# Patient Record
Sex: Female | Born: 1985 | Race: White | Hispanic: No | Marital: Married | Smoking: Current every day smoker
Health system: Southern US, Community
[De-identification: ages and names within clinical notes are randomized; demographics above are authoritative.]

## PROBLEM LIST (undated history)

## (undated) DIAGNOSIS — N644 Mastodynia: Secondary | ICD-10-CM

## (undated) DIAGNOSIS — K219 Gastro-esophageal reflux disease without esophagitis: Secondary | ICD-10-CM

## (undated) DIAGNOSIS — M549 Dorsalgia, unspecified: Secondary | ICD-10-CM

## (undated) DIAGNOSIS — F419 Anxiety disorder, unspecified: Secondary | ICD-10-CM

## (undated) DIAGNOSIS — E785 Hyperlipidemia, unspecified: Secondary | ICD-10-CM

## (undated) DIAGNOSIS — N3281 Overactive bladder: Secondary | ICD-10-CM

## (undated) DIAGNOSIS — IMO0001 Reserved for inherently not codable concepts without codable children: Secondary | ICD-10-CM

## (undated) DIAGNOSIS — F17200 Nicotine dependence, unspecified, uncomplicated: Secondary | ICD-10-CM

## (undated) DIAGNOSIS — R1032 Left lower quadrant pain: Secondary | ICD-10-CM

## (undated) DIAGNOSIS — L0292 Furuncle, unspecified: Secondary | ICD-10-CM

## (undated) DIAGNOSIS — Z309 Encounter for contraceptive management, unspecified: Secondary | ICD-10-CM

## (undated) DIAGNOSIS — R350 Frequency of micturition: Secondary | ICD-10-CM

## (undated) HISTORY — DX: Mastodynia: N64.4

## (undated) HISTORY — DX: Overactive bladder: N32.81

## (undated) HISTORY — DX: Left lower quadrant pain: R10.32

## (undated) HISTORY — DX: Nicotine dependence, unspecified, uncomplicated: F17.200

## (undated) HISTORY — PX: NO PAST SURGERIES: SHX2092

## (undated) HISTORY — DX: Hyperlipidemia, unspecified: E78.5

## (undated) HISTORY — DX: Furuncle, unspecified: L02.92

## (undated) HISTORY — DX: Encounter for contraceptive management, unspecified: Z30.9

## (undated) HISTORY — DX: Frequency of micturition: R35.0

## (undated) HISTORY — DX: Reserved for inherently not codable concepts without codable children: IMO0001

## (undated) HISTORY — DX: Gastro-esophageal reflux disease without esophagitis: K21.9

---
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
## 2004-01-21 ENCOUNTER — Emergency Department (HOSPITAL_COMMUNITY): Admission: EM | Admit: 2004-01-21 | Discharge: 2004-01-21 | Payer: Self-pay | Admitting: Emergency Medicine

## 2004-03-10 ENCOUNTER — Emergency Department (HOSPITAL_COMMUNITY): Admission: EM | Admit: 2004-03-10 | Discharge: 2004-03-10 | Payer: Self-pay | Admitting: Emergency Medicine

## 2004-03-26 ENCOUNTER — Ambulatory Visit (HOSPITAL_COMMUNITY): Admission: RE | Admit: 2004-03-26 | Discharge: 2004-03-26 | Payer: Self-pay | Admitting: Family Medicine

## 2004-06-07 ENCOUNTER — Emergency Department (HOSPITAL_COMMUNITY): Admission: EM | Admit: 2004-06-07 | Discharge: 2004-06-07 | Payer: Self-pay | Admitting: Emergency Medicine

## 2004-06-08 ENCOUNTER — Ambulatory Visit (HOSPITAL_COMMUNITY): Admission: RE | Admit: 2004-06-08 | Discharge: 2004-06-08 | Payer: Self-pay | Admitting: Emergency Medicine

## 2004-07-07 ENCOUNTER — Emergency Department (HOSPITAL_COMMUNITY): Admission: EM | Admit: 2004-07-07 | Discharge: 2004-07-08 | Payer: Self-pay | Admitting: Emergency Medicine

## 2004-08-13 ENCOUNTER — Ambulatory Visit (HOSPITAL_COMMUNITY): Admission: RE | Admit: 2004-08-13 | Discharge: 2004-08-13 | Payer: Self-pay | Admitting: Family Medicine

## 2004-09-22 ENCOUNTER — Emergency Department (HOSPITAL_COMMUNITY): Admission: EM | Admit: 2004-09-22 | Discharge: 2004-09-23 | Payer: Self-pay | Admitting: *Deleted

## 2004-11-27 ENCOUNTER — Emergency Department (HOSPITAL_COMMUNITY): Admission: EM | Admit: 2004-11-27 | Discharge: 2004-11-27 | Payer: Self-pay | Admitting: Emergency Medicine

## 2005-06-05 ENCOUNTER — Emergency Department (HOSPITAL_COMMUNITY): Admission: EM | Admit: 2005-06-05 | Discharge: 2005-06-05 | Payer: Self-pay | Admitting: Emergency Medicine

## 2005-09-01 ENCOUNTER — Emergency Department (HOSPITAL_COMMUNITY): Admission: EM | Admit: 2005-09-01 | Discharge: 2005-09-01 | Payer: Self-pay | Admitting: Emergency Medicine

## 2005-10-31 ENCOUNTER — Inpatient Hospital Stay (HOSPITAL_COMMUNITY): Admission: EM | Admit: 2005-10-31 | Discharge: 2005-11-02 | Payer: Self-pay | Admitting: Emergency Medicine

## 2005-11-14 ENCOUNTER — Ambulatory Visit (HOSPITAL_COMMUNITY): Admission: RE | Admit: 2005-11-14 | Discharge: 2005-11-14 | Payer: Self-pay | Admitting: Obstetrics and Gynecology

## 2005-12-23 ENCOUNTER — Emergency Department (HOSPITAL_COMMUNITY): Admission: EM | Admit: 2005-12-23 | Discharge: 2005-12-23 | Payer: Self-pay | Admitting: Emergency Medicine

## 2006-01-03 ENCOUNTER — Emergency Department (HOSPITAL_COMMUNITY): Admission: EM | Admit: 2006-01-03 | Discharge: 2006-01-04 | Payer: Self-pay | Admitting: Emergency Medicine

## 2006-02-12 ENCOUNTER — Emergency Department (HOSPITAL_COMMUNITY): Admission: EM | Admit: 2006-02-12 | Discharge: 2006-02-12 | Payer: Self-pay | Admitting: Emergency Medicine

## 2006-03-07 ENCOUNTER — Emergency Department (HOSPITAL_COMMUNITY): Admission: EM | Admit: 2006-03-07 | Discharge: 2006-03-07 | Payer: Self-pay | Admitting: Emergency Medicine

## 2006-03-21 ENCOUNTER — Emergency Department (HOSPITAL_COMMUNITY): Admission: EM | Admit: 2006-03-21 | Discharge: 2006-03-21 | Payer: Self-pay | Admitting: Emergency Medicine

## 2006-04-13 ENCOUNTER — Emergency Department (HOSPITAL_COMMUNITY): Admission: EM | Admit: 2006-04-13 | Discharge: 2006-04-13 | Payer: Self-pay | Admitting: Emergency Medicine

## 2006-06-09 ENCOUNTER — Emergency Department (HOSPITAL_COMMUNITY): Admission: EM | Admit: 2006-06-09 | Discharge: 2006-06-09 | Payer: Self-pay | Admitting: Emergency Medicine

## 2006-06-22 ENCOUNTER — Emergency Department (HOSPITAL_COMMUNITY): Admission: EM | Admit: 2006-06-22 | Discharge: 2006-06-22 | Payer: Self-pay | Admitting: Emergency Medicine

## 2006-07-20 ENCOUNTER — Emergency Department (HOSPITAL_COMMUNITY): Admission: EM | Admit: 2006-07-20 | Discharge: 2006-07-20 | Payer: Self-pay | Admitting: Emergency Medicine

## 2006-08-17 ENCOUNTER — Emergency Department (HOSPITAL_COMMUNITY): Admission: EM | Admit: 2006-08-17 | Discharge: 2006-08-17 | Payer: Self-pay | Admitting: Emergency Medicine

## 2006-08-27 ENCOUNTER — Emergency Department (HOSPITAL_COMMUNITY): Admission: EM | Admit: 2006-08-27 | Discharge: 2006-08-27 | Payer: Self-pay | Admitting: Emergency Medicine

## 2006-09-24 ENCOUNTER — Emergency Department (HOSPITAL_COMMUNITY): Admission: EM | Admit: 2006-09-24 | Discharge: 2006-09-25 | Payer: Self-pay | Admitting: Emergency Medicine

## 2006-10-11 ENCOUNTER — Emergency Department (HOSPITAL_COMMUNITY): Admission: EM | Admit: 2006-10-11 | Discharge: 2006-10-11 | Payer: Self-pay | Admitting: Emergency Medicine

## 2006-12-09 IMAGING — CR DG CHEST 2V
2 series · 2 of 2 positions shown · non-contrast
Comparison: 08/13/2004.

CLINICAL DATA: Fever, chest congestion and myalgias.

CHEST - 2 VIEW

[view not recorded (1 of 2)]
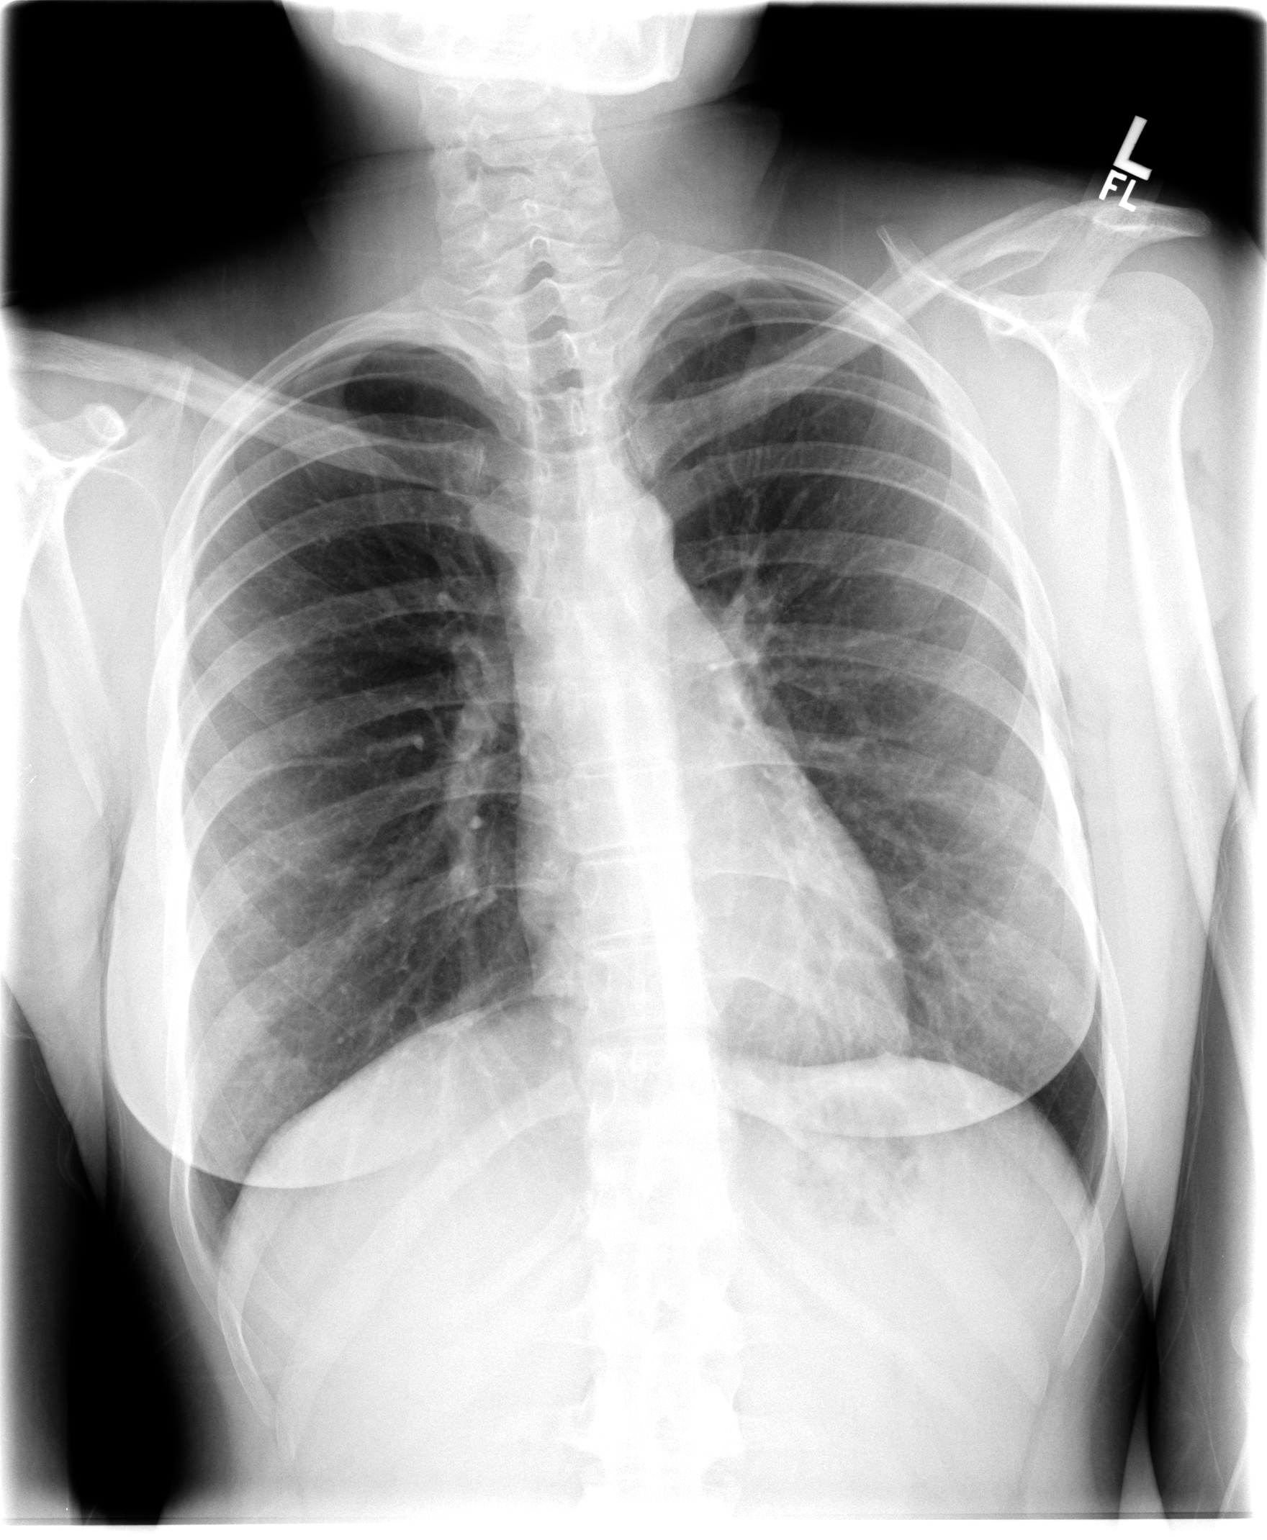

[view not recorded (2 of 2)]
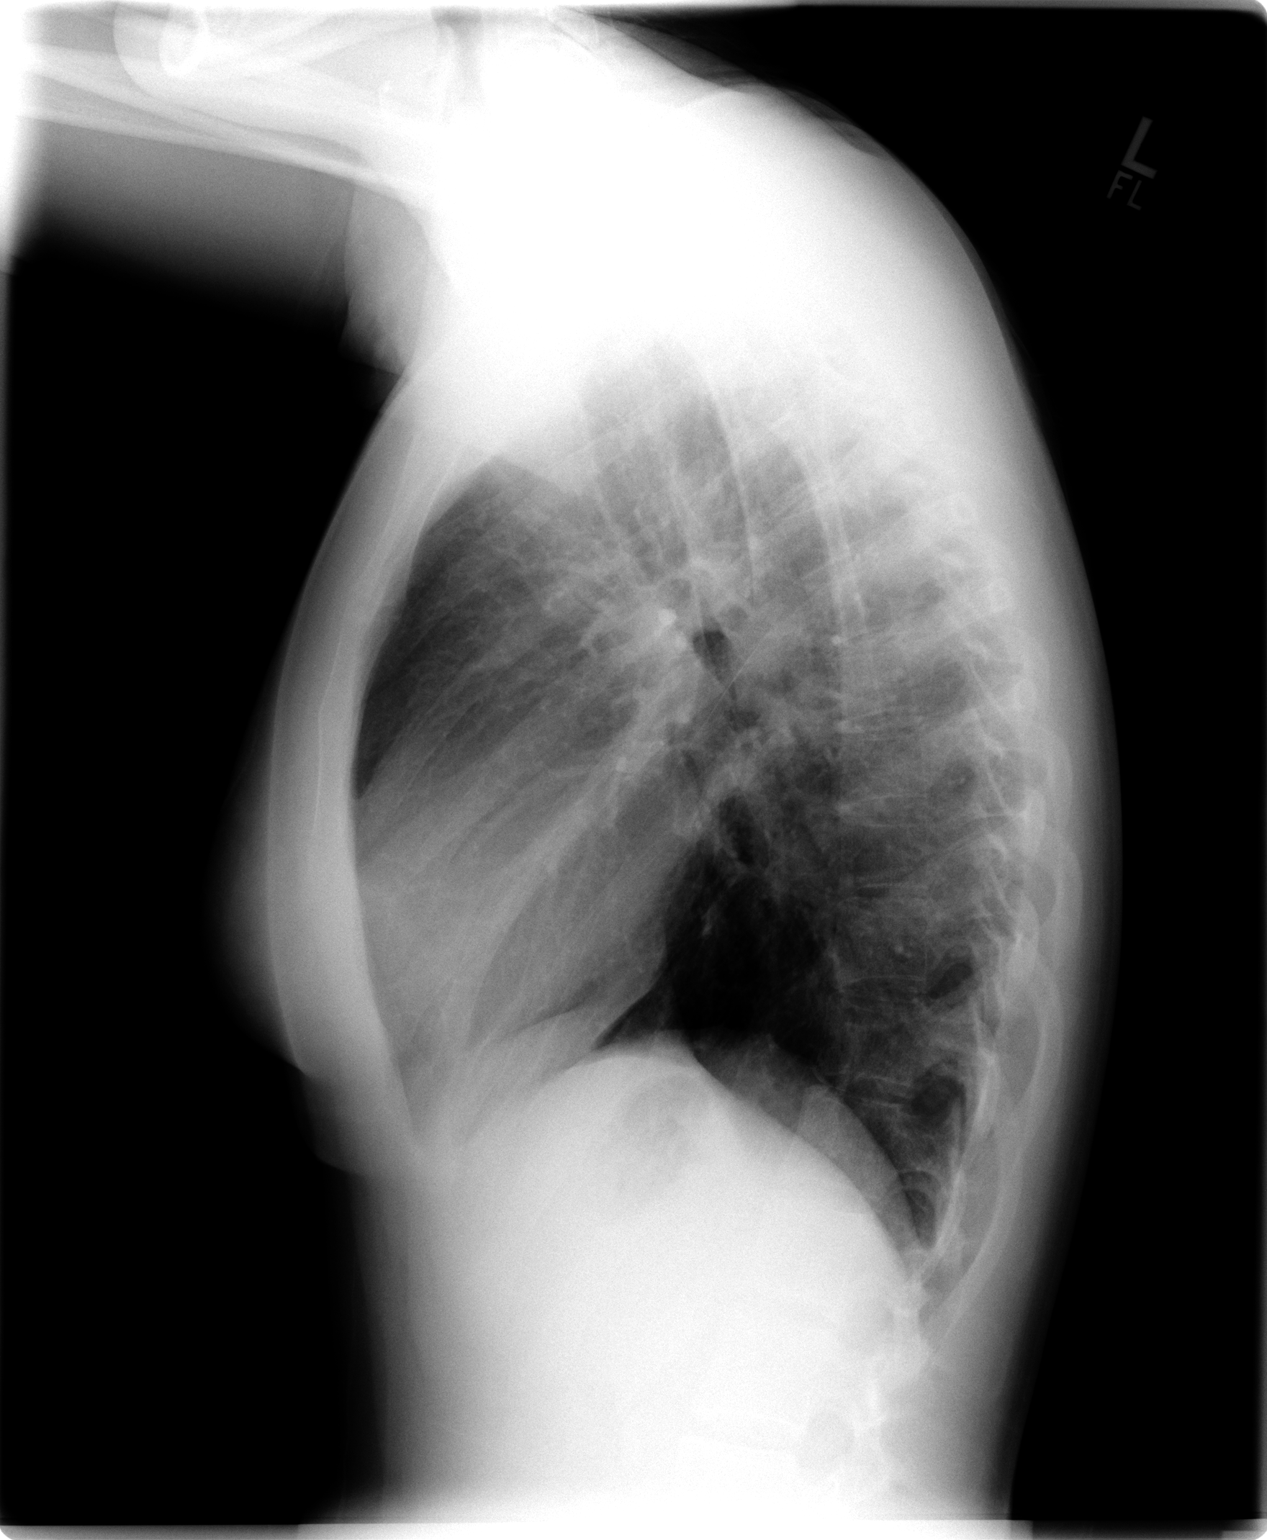

[2 of 2 positions shown; findings below may reference images not displayed]

FINDINGS: Stable normal sized heart and mild diffuse peribronchial thickening.
No airspace consolidation. Mild positional scoliosis.

IMPRESSION

Stable mild bronchitic changes.

## 2007-02-11 ENCOUNTER — Ambulatory Visit (HOSPITAL_COMMUNITY): Admission: AD | Admit: 2007-02-11 | Discharge: 2007-02-11 | Payer: Self-pay | Admitting: Obstetrics and Gynecology

## 2007-03-11 ENCOUNTER — Ambulatory Visit (HOSPITAL_COMMUNITY): Admission: AD | Admit: 2007-03-11 | Discharge: 2007-03-11 | Payer: Self-pay | Admitting: Obstetrics and Gynecology

## 2007-04-13 ENCOUNTER — Ambulatory Visit (HOSPITAL_COMMUNITY): Admission: RE | Admit: 2007-04-13 | Discharge: 2007-04-13 | Payer: Self-pay | Admitting: Obstetrics and Gynecology

## 2007-04-24 ENCOUNTER — Ambulatory Visit (HOSPITAL_COMMUNITY): Admission: AD | Admit: 2007-04-24 | Discharge: 2007-04-24 | Payer: Self-pay | Admitting: Obstetrics and Gynecology

## 2007-04-25 ENCOUNTER — Inpatient Hospital Stay (HOSPITAL_COMMUNITY): Admission: AD | Admit: 2007-04-25 | Discharge: 2007-04-26 | Payer: Self-pay | Admitting: Obstetrics and Gynecology

## 2008-06-17 ENCOUNTER — Emergency Department (HOSPITAL_COMMUNITY): Admission: EM | Admit: 2008-06-17 | Discharge: 2008-06-17 | Payer: Self-pay | Admitting: *Deleted

## 2008-07-27 ENCOUNTER — Ambulatory Visit (HOSPITAL_COMMUNITY): Admission: RE | Admit: 2008-07-27 | Discharge: 2008-07-27 | Payer: Self-pay | Admitting: Family Medicine

## 2010-02-13 ENCOUNTER — Emergency Department (HOSPITAL_COMMUNITY): Admission: EM | Admit: 2010-02-13 | Discharge: 2010-02-13 | Payer: Self-pay | Admitting: Emergency Medicine

## 2010-04-18 ENCOUNTER — Ambulatory Visit (HOSPITAL_COMMUNITY): Admission: RE | Admit: 2010-04-18 | Discharge: 2010-04-18 | Payer: Self-pay | Admitting: Family Medicine

## 2010-08-24 ENCOUNTER — Ambulatory Visit (HOSPITAL_COMMUNITY): Admission: RE | Admit: 2010-08-24 | Discharge: 2010-08-24 | Payer: Self-pay | Admitting: Internal Medicine

## 2010-08-28 ENCOUNTER — Other Ambulatory Visit: Admission: RE | Admit: 2010-08-28 | Discharge: 2010-08-28 | Payer: Self-pay | Admitting: Obstetrics & Gynecology

## 2010-09-28 ENCOUNTER — Ambulatory Visit (HOSPITAL_COMMUNITY): Admission: RE | Admit: 2010-09-28 | Discharge: 2010-09-28 | Payer: Self-pay | Admitting: Internal Medicine

## 2010-10-14 ENCOUNTER — Emergency Department (HOSPITAL_COMMUNITY): Admission: EM | Admit: 2010-10-14 | Discharge: 2010-10-14 | Payer: Self-pay | Admitting: Emergency Medicine

## 2010-12-22 ENCOUNTER — Encounter: Payer: Self-pay | Admitting: Family Medicine

## 2010-12-23 ENCOUNTER — Encounter: Payer: Self-pay | Admitting: Internal Medicine

## 2011-01-31 ENCOUNTER — Other Ambulatory Visit: Payer: Self-pay | Admitting: Obstetrics & Gynecology

## 2011-01-31 DIAGNOSIS — N644 Mastodynia: Secondary | ICD-10-CM

## 2011-02-06 ENCOUNTER — Ambulatory Visit (HOSPITAL_COMMUNITY)
Admission: RE | Admit: 2011-02-06 | Discharge: 2011-02-06 | Disposition: A | Discharge status: Home / Self Care | Payer: Medicaid Other | Source: Ambulatory Visit | Attending: Obstetrics & Gynecology | Admitting: Obstetrics & Gynecology

## 2011-02-06 DIAGNOSIS — N644 Mastodynia: Secondary | ICD-10-CM | POA: Insufficient documentation

## 2011-02-06 DIAGNOSIS — N63 Unspecified lump in unspecified breast: Secondary | ICD-10-CM | POA: Insufficient documentation

## 2011-02-12 LAB — CBC
MCH: 30.3 pg (ref 26.0–34.0)
MCHC: 34.5 g/dL (ref 30.0–36.0)
Platelets: 158 10*3/uL (ref 150–400)
RDW: 12.8 % (ref 11.5–15.5)

## 2011-02-12 LAB — BASIC METABOLIC PANEL
BUN: 6 mg/dL (ref 6–23)
CO2: 24 mEq/L (ref 19–32)
Calcium: 8.9 mg/dL (ref 8.4–10.5)
Creatinine, Ser: 0.77 mg/dL (ref 0.4–1.2)
GFR calc non Af Amer: >60 mL/min (ref >60)
Glucose, Bld: 106 mg/dL — ABNORMAL HIGH (ref 70–99)

## 2011-02-12 LAB — URINALYSIS, ROUTINE W REFLEX MICROSCOPIC
Glucose, UA: NEGATIVE mg/dL
Protein, ur: NEGATIVE mg/dL
Urobilinogen, UA: 0.2 mg/dL (ref 0.0–1.0)

## 2011-02-12 LAB — DIFFERENTIAL
Basophils Absolute: 0.1 10*3/uL (ref 0.0–0.1)
Basophils Relative: 1 % (ref 0–1)
Eosinophils Absolute: 0 10*3/uL (ref 0.0–0.7)
Monocytes Relative: 7 % (ref 3–12)
Neutrophils Relative %: 74 % (ref 43–77)

## 2011-04-16 NOTE — H&P (Signed)
NAMEDESIRE, FULP NO.:  [id number]   MEDICAL RECORD NO.:  [id number]          PATIENT TYPE:  INP   LOCATION:  LDR1                          FACILITY:  [facility]   PHYSICIAN:  Lazaro Arms, M.D.   DATE OF BIRTH:  May 29, 1986   DATE OF ADMISSION:  04/25/2007  DATE OF DISCHARGE:  LH                              HISTORY & PHYSICAL   Rebecca [name] is a 25 year old white female, gravida 2, para 1.  Estimated date of delivery of Apr 24, 2007 at 40 weeks and a day  gestation, who presented complaining of regular uterine contractions.  She was found to be 4 cm and in labor.  She has a reactive NST.  When I  checked her she was 5, 100, and 0 station.  She did an amniotic with  clear fluid.  Her antepartum course has been unremarkable.   PAST MEDICAL HISTORY:  Negative.   PAST SURGICAL HISTORY:  Negative.   PAST OB HISTORY:  Vaginal delivery 36-1/2 weeks in 2002.   ALLERGIES:  AMOXICILLIN AND FLAGYL.   REVIEW OF SYSTEMS:  Otherwise negative.   Blood type is 0-positive.  Rubella is equivocal.  Varicella is immune.  Serology is nonreactive.  Hepatitis B was negative.  HSV-2 was negative.  Pap was normal.  GC and chlamydia were negative x2.  Group B strep was  negative.  Glucola was normal.   IMPRESSION:  1. Intrauterine pregnancy at 40-1/[redacted] weeks gestation.  2. Labor.   PLAN:  The expected management for NSVD.      Lazaro Arms, M.D.  Electronically Signed     LHE/MEDQ  D:  04/25/2007  T:  04/25/2007  Job:  [id number]

## 2011-04-16 NOTE — Op Note (Signed)
NAME:  VERSA, CRATON NO.:  [id number]   MEDICAL RECORD NO.:  [id number]          PATIENT TYPE:  INP   LOCATION:  LDR1                          FACILITY:  [facility]   PHYSICIAN:  Lazaro Arms, M.D.   DATE OF BIRTH:  Mar 06, 1986   DATE OF PROCEDURE:  04/25/2007  DATE OF DISCHARGE:                               OPERATIVE REPORT   Rebecca [name] is a 25 year old lady who progressed well through the  first stage of labor, had about 4 contractions where she had maternal  expulsive efforts, over an intact perineum delivered a viable female  infant at 11:05 with Apgars of 9 and 9, weighing 6 pounds 2 ounces.  There was a 3-vessel cord.  Cord blood and cord gas were sent.  Placenta  was delivered spontaneously, was normal intact.  The uterus was firm  below the umbilicus.  There was a second-degree vaginal laceration which  resulted that required two 2-0 Monocryl sutures, and it was not  bleeding.  She had very minor abrasion in the right periurethral area  that was not bleeding and did not require any repair.  She will undergo  routine postpartum [name].  She is group B strep negative, rubella  equivocal.      Lazaro Arms, M.D.  Electronically Signed     LHE/MEDQ  D:  04/25/2007  T:  04/25/2007  Job:  [id number]

## 2011-04-19 NOTE — H&P (Signed)
NAMEARIELY, RIDDELL              ACCOUNT NO.:  [id number]   MEDICAL RECORD NO.:  [id number]          PATIENT TYPE:  OIB   LOCATION:  A415                          FACILITY:  [facility]   PHYSICIAN:  Tilda Burrow, M.D. DATE OF BIRTH:  1986/07/20   DATE OF ADMISSION:  03/11/2007  DATE OF DISCHARGE:  04/09/2008LH                              HISTORY & PHYSICAL   OBSERVATION ADMISSION HISTORY AND PHYSICAL   REASON FOR ADMISSION:  Pregnancy at 34 weeks and 2 days with complaints  of sharp, shooting, low abdominal pains and lots of pelvic pressure.   MEDICAL HISTORY:  Negative.   SURGICAL HISTORY:  Negative.   ALLERGIES:  1. AMOXICILLIN.  2. FLAGYL.   FAMILY HISTORY:  Positive for coronary artery disease and cancer.   Prenatal course has been uneventful.  Blood type is O-positive, UDS  negative, Rubella is equivocal, hepatitis B surface antigen negative,  HIV is negative, HSV is negative.  Serology is negative on blood draws.  Pap is normal, GC and Chlamydia are negative, aFP is within normal  limits.  A 28-week hemoglobin 11.3, 28-week hematocrit 36.0, one-hour  glucose is 88.   PHYSICAL EXAMINATION:  VITAL SIGNS:  Stable.  PELVIC EXAM:  Cervix is 1 to 2, which she was that in the office  yesterday, about 70% effaced, about minus one station.   PLAN:  We are going to monitor, evaluate, and possible tocolytics if  uterine contractions are regular in nature.     Zerita Boers, Lanier Clam      Tilda Burrow, M.D.  Electronically Signed   DL/MEDQ  D:  42/[id number]  T:  03/11/2007  Job:  [id number]

## 2011-04-19 NOTE — Discharge Summary (Signed)
NAMECRIS, GIBBY              ACCOUNT NO.:  [id number]   MEDICAL RECORD NO.:  [id number]          PATIENT TYPE:  INP   LOCATION:  A304                          FACILITY:  [facility]   PHYSICIAN:  Corrie Mckusick, M.D.  DATE OF BIRTH:  1986/04/10   DATE OF ADMISSION:  10/31/2005  DATE OF DISCHARGE:  12/02/2006LH                                 DISCHARGE SUMMARY   DISCHARGE DIAGNOSIS:  Viral gastroenteritis.   HISTORY OF PRESENT ILLNESS:  For history of present illness and past medical  history, please see admission H&P.   HOSPITAL COURSE:  This is a 25 year old female with no significant past  medical history who presented to the emergency department with 24 hours of  increasing nausea, vomiting and watery diarrhea.  She was put into the  hospital for aggressive IV fluids and close monitoring.  She did develop  some hypokalemia and this was replaced.  She responded beautifully after the  first day.  She had some dilutional change in her hemoglobin and hematocrit,  although guaiacing of stools were all negative.  She was started on a PPI  for mid epigastric pain.  We also got an abdominal ultrasound which was  negative as I wanted to make sure we did not have any cystic disease.  She  has had for the past couple of months mid epigastric pain which sounds more  GERD like.  Interestingly enough on ultrasound, she had food found in the  stomach and she had been NPO x6 hours.  We decided to set her up as an  outpatient to see the gastrointestinal specialist for probable EGD and maybe  even upper GI series.   DISCHARGE PHYSICAL EXAMINATION:  VITAL SIGNS:  Temperature 98.2, blood  pressure 83/53, pulse 95, respirations 20.  GENERAL:  A pleasant female, feels great in no acute distress.  HEENT:  Nasopharynx clear with moist mucous membranes.  NECK:  Supple.  CHEST:  Clear to auscultation bilaterally.  CARDIAC:  Regular rate and rhythm with normal S1, S2, no murmurs.  ABDOMEN:  Bowel sounds  positive, soft, nontender, nondistended.  EXTREMITIES:  No edema.   DISCHARGE MEDICATIONS:  Aciphex 20 mg daily.   DISCHARGE LABORATORY DATA AND X-RAY FINDINGS:  Please see flow sheet for  details.   FOLLOW UP:  She is going to follow up with me next week and recheck  potassium as well as setting her up with Dr. Karilyn Cota as an outpatient.  If  she has any concerns or problems in the meantime she will follow up.      Corrie Mckusick, M.D.  Electronically Signed     JCG/MEDQ  D:  11/02/2005  T:  11/02/2005  Job:  [id number]

## 2011-04-19 NOTE — H&P (Signed)
Rebecca, [name] NO.:  [id number]   MEDICAL RECORD NO.:  [id number]          PATIENT TYPE:  INP   LOCATION:  A210                          FACILITY:  [facility]   PHYSICIAN:  Corrie Mckusick, M.D.  DATE OF BIRTH:  12-20-1985   DATE OF ADMISSION:  10/31/2005  DATE OF DISCHARGE:  LH                                HISTORY & PHYSICAL   ADMISSION DIAGNOSIS:  Viral gastroenteritis.   HISTORY OF PRESENT ILLNESS:  This is a 25 year old female with no  significant past medical history who presents to the emergency department  with 24 hours of increasing nausea, vomiting and watery diarrhea.  She had  not been able to keep anything down and came in to the emergency department  dehydrated.  No other real symptomatology from a cardiovascular, respiratory  or GU standpoint.  She has no other neurological symptomatology.   She had no recent travel, no recent antibiotics, no new foods, no fresh  water and no sick contacts.  In the emergency department, she started to get  IV fluids and felt remarkably better.   It should be noted that she has had about 6-8 weeks of increasing mid  epigastric discomfort and nausea when eating fatty foods.  She says she has  no right upper quadrant pain and there is no family history of gallbladder  disease.   PAST MEDICAL HISTORY:  None.   PAST SURGICAL HISTORY:  None.   SOCIAL HISTORY:  Smokes about 1 pack a day.  No alcohol or illicit drug use  at all.   FAMILY HISTORY:  Noncontributory.   MEDICATIONS:  None.   ALLERGIES:  CIPRO.   PHYSICAL EXAMINATION:  VITAL SIGNS:  Temperature 99.4, blood pressure  108/52, pulse 100, respirations 18.  GENERAL:  When I saw her she was pleasant, feeling quite a bit better in no  acute distress.  HEENT:  Nasopharynx is clear with slightly dry mucous membranes.  NECK:  Supple with no lymphadenopathy.  CHEST:  Clear to auscultation bilaterally.  CARDIAC:  Regular rate and rhythm, normal S1, S2.   No S3, murmurs, rubs or  gallops.  ABDOMEN:  Bowel sounds positive.  There is tenderness in the periumbilical  area.  No right upper quadrant pain.  No right lower quadrant pain  whatsoever.  No rebound or guarding.  EXTREMITIES:  No edema.  NEUROLOGIC:  Intact.   LABORATORY DATA AND X-RAY FINDINGS:  Chest x-ray was clear.   Blood cultures drawn with white count 18.3, hematocrit 43.0, platelets 217.  Sodium 139, potassium 4.1, chloride 109, bicarb 24, glucose 130, BUN 11,  creatinine 0.9, calcium 9.6.  Urinalysis is clear.   ASSESSMENT:  A 25 year old with probable gastroenteritis with questionable  underlying gastroesophageal reflux disease.   PLAN:  1.  Admit for aggressive IV fluids.  2.  Close monitoring.  3.  Continue to follow electrolytes.  4.  PPI.  5.  Cultures are pending.  The patient did receive a dose of Rocephin in the      ER.  6.  Will continue to follow closely.  Corrie Mckusick, M.D.  Electronically Signed     JCG/MEDQ  D:  11/01/2005  T:  11/01/2005  Job:  [id number]

## 2011-04-21 ENCOUNTER — Emergency Department (HOSPITAL_COMMUNITY)
Admission: EM | Admit: 2011-04-21 | Discharge: 2011-04-22 | Disposition: A | Discharge status: Home / Self Care | Payer: Medicaid Other | Attending: Emergency Medicine | Admitting: Emergency Medicine

## 2011-04-21 DIAGNOSIS — K5289 Other specified noninfective gastroenteritis and colitis: Secondary | ICD-10-CM | POA: Insufficient documentation

## 2011-04-21 DIAGNOSIS — Z79899 Other long term (current) drug therapy: Secondary | ICD-10-CM | POA: Insufficient documentation

## 2011-04-21 DIAGNOSIS — F411 Generalized anxiety disorder: Secondary | ICD-10-CM | POA: Insufficient documentation

## 2011-04-21 LAB — CBC
HCT: 45.3 % (ref 36.0–46.0)
Hemoglobin: 15.4 g/dL — ABNORMAL HIGH (ref 12.0–15.0)
MCH: 30.4 pg (ref 26.0–34.0)
MCHC: 34 g/dL (ref 30.0–36.0)
MCV: 89.5 fL (ref 78.0–100.0)
Platelets: 209 K/uL (ref 150–400)
RBC: 5.06 MIL/uL (ref 3.87–5.11)
RDW: 13.3 % (ref 11.5–15.5)
WBC: 15.5 K/uL — ABNORMAL HIGH (ref 4.0–10.5)

## 2011-04-21 LAB — URINE MICROSCOPIC-ADD ON

## 2011-04-21 LAB — DIFFERENTIAL
Basophils Absolute: 0 10*3/uL (ref 0.0–0.1)
Eosinophils Relative: 1 % (ref 0–5)
Lymphocytes Relative: 9 % — ABNORMAL LOW (ref 12–46)
Lymphs Abs: 1.3 10*3/uL (ref 0.7–4.0)
Monocytes Absolute: 0.6 10*3/uL (ref 0.1–1.0)

## 2011-04-21 LAB — URINALYSIS, ROUTINE W REFLEX MICROSCOPIC
Glucose, UA: NEGATIVE mg/dL
Hgb urine dipstick: NEGATIVE
Nitrite: NEGATIVE
Protein, ur: 100 mg/dL — AB
Specific Gravity, Urine: >1.03 — ABNORMAL HIGH (ref 1.005–1.030)
Urobilinogen, UA: 1 mg/dL (ref 0.0–1.0)
pH: 5.5 (ref 5.0–8.0)

## 2011-04-21 LAB — LIPASE, BLOOD: Lipase: 29 U/L (ref 11–59)

## 2011-04-21 LAB — COMPREHENSIVE METABOLIC PANEL
BUN: 12 mg/dL (ref 6–23)
Calcium: 10.6 mg/dL — ABNORMAL HIGH (ref 8.4–10.5)
Glucose, Bld: 135 mg/dL — ABNORMAL HIGH (ref 70–99)
Total Protein: 8.4 g/dL — ABNORMAL HIGH (ref 6.0–8.3)

## 2011-04-23 LAB — URINE CULTURE: Culture  Setup Time: 201205211230

## 2011-04-25 LAB — POCT PREGNANCY, URINE: Preg Test, Ur: NEGATIVE

## 2011-08-30 ENCOUNTER — Other Ambulatory Visit (HOSPITAL_COMMUNITY)
Admission: RE | Admit: 2011-08-30 | Discharge: 2011-08-30 | Disposition: A | Discharge status: Home / Self Care | Payer: Medicaid Other | Source: Ambulatory Visit | Attending: Obstetrics & Gynecology | Admitting: Obstetrics & Gynecology

## 2011-08-30 ENCOUNTER — Other Ambulatory Visit: Payer: Self-pay | Admitting: Obstetrics & Gynecology

## 2011-08-30 DIAGNOSIS — Z113 Encounter for screening for infections with a predominantly sexual mode of transmission: Secondary | ICD-10-CM | POA: Insufficient documentation

## 2011-08-30 DIAGNOSIS — Z01419 Encounter for gynecological examination (general) (routine) without abnormal findings: Secondary | ICD-10-CM | POA: Insufficient documentation

## 2011-11-10 ENCOUNTER — Encounter: Payer: Self-pay | Admitting: Emergency Medicine

## 2011-11-10 ENCOUNTER — Emergency Department (HOSPITAL_COMMUNITY)
Admission: EM | Admit: 2011-11-10 | Discharge: 2011-11-10 | Disposition: A | Discharge status: Home / Self Care | Payer: Self-pay | Attending: Emergency Medicine | Admitting: Emergency Medicine

## 2011-11-10 DIAGNOSIS — F17200 Nicotine dependence, unspecified, uncomplicated: Secondary | ICD-10-CM | POA: Insufficient documentation

## 2011-11-10 DIAGNOSIS — J111 Influenza due to unidentified influenza virus with other respiratory manifestations: Secondary | ICD-10-CM | POA: Insufficient documentation

## 2011-11-10 MED ORDER — BENZONATATE 200 MG PO CAPS: 200.0000 mg | ORAL_CAPSULE | Freq: Three times a day (TID) | ORAL | Status: AC

## 2011-11-10 NOTE — ED Notes (Signed)
Pt c/o cough, congestion sore throat and body aches x 3 days.

## 2011-11-11 NOTE — ED Provider Notes (Signed)
History     CSN: [id number] Arrival date & time: 11/10/2011 11:45 AM   First MD Initiated Contact with Patient 11/10/11 1200      Chief Complaint  Patient presents with  . Cough    (Consider location/radiation/quality/duration/timing/severity/associated sxs/prior treatment) Patient is a 25 y.o. female presenting with cough. The history is provided by the patient.  Cough This is a new problem. The current episode started more than 2 days ago. The problem occurs constantly. The problem has not changed since onset.The cough is non-productive. The maximum temperature recorded prior to her arrival was 100 to 100.9 F. The fever has been present for 1 to 2 days. Associated symptoms include chills, sore throat and myalgias. Pertinent negatives include no chest pain, no ear pain, no headaches and no shortness of breath. Associated symptoms comments: Fatigue . Treatments tried: tylenol. The treatment provided mild (transient improvement in fever.  she has not recognized triggers that worsen her symptoms.Marland Kitchen) relief. She is a smoker. Her past medical history does not include pneumonia or asthma.    History reviewed. No pertinent past medical history.  History reviewed. No pertinent past surgical history.  No family history on file.  History  Substance Use Topics  . Smoking status: Current Everyday Smoker -- 1.0 packs/day for 5 years    Types: Cigarettes  . Smokeless tobacco: Never Used  . Alcohol Use: No    OB History    Grav Para Term Preterm Abortions TAB SAB Ect Mult Living   2 2 2              Review of Systems  Constitutional: Positive for fever, chills and fatigue.  HENT: Positive for congestion and sore throat. Negative for ear pain and neck pain.   Eyes: Negative.   Respiratory: Positive for cough. Negative for chest tightness and shortness of breath.   Cardiovascular: Negative for chest pain.  Gastrointestinal: Negative for nausea and abdominal pain.  Genitourinary:  Negative.   Musculoskeletal: Positive for myalgias. Negative for joint swelling and arthralgias.  Skin: Negative.  Negative for rash and wound.  Neurological: Negative for dizziness, light-headedness, numbness and headaches.  Hematological: Negative.   Psychiatric/Behavioral: Negative.     Allergies  Amoxicillin; Cephalosporins; and Septra  Home Medications   Current Outpatient Rx  Name Route Sig Dispense Refill  . ALPRAZOLAM 1 MG PO TABS Oral Take 1 mg by mouth every 4 (four) hours.      Marland Kitchen HYDROCODONE-ACETAMINOPHEN 10-650 MG PO TABS Oral Take 1 tablet by mouth every 4 (four) hours as needed.      Marland Kitchen BENZONATATE 200 MG PO CAPS Oral Take 1 capsule (200 mg total) by mouth every 8 (eight) hours. Take if needed for cough 21 capsule 0    BP 109/73  Pulse 71  Temp(Src) 99 F (37.2 C) (Oral)  Resp 16  Ht 5\' 1"  (1.549 m)  Wt 130 lb (58.968 kg)  BMI 24.56 kg/m2  SpO2 100%  LMP 07/11/2011  Physical Exam  Nursing note and vitals reviewed. Constitutional: She is oriented to person, place, and time. She appears well-developed and well-nourished.       Appears fatigued.   HENT:  Head: Normocephalic and atraumatic.  Right Ear: External ear normal.  Left Ear: External ear normal.  Mouth/Throat: Oropharynx is clear and moist.  Eyes: Conjunctivae are normal.  Neck: Normal range of motion.  Cardiovascular: Normal rate, regular rhythm, normal heart sounds and intact distal pulses.   Pulmonary/Chest: Breath sounds normal. She is in  respiratory distress. She has no wheezes. She has no rales.       Frequent dry cough.  Abdominal: Soft. Bowel sounds are normal. There is no tenderness.  Musculoskeletal: Normal range of motion.  Neurological: She is alert and oriented to person, place, and time.  Skin: Skin is warm and dry.  Psychiatric: She has a normal mood and affect.    ED Course  Procedures (including critical care time)  Labs Reviewed - No data to display No results found.   1.  Influenza       MDM  No acute distress ,  Stable vital signs.  The patient appears reasonably screened and/or stabilized for discharge and I doubt any other medical condition or other [facility] requiring further screening, evaluation, or treatment in the ED at this time prior to discharge.         Candis Musa, PA 11/11/11 1459

## 2011-11-11 NOTE — ED Provider Notes (Signed)
Medical screening examination/treatment/procedure(s) were performed by non-physician practitioner and as supervising physician I was immediately available for consultation/collaboration.   Chenay Nesmith W. Natash Berman, MD 11/11/11 1749 

## 2012-08-22 ENCOUNTER — Emergency Department (HOSPITAL_COMMUNITY): Payer: Medicaid Other

## 2012-08-22 ENCOUNTER — Emergency Department (HOSPITAL_COMMUNITY)
Admission: EM | Admit: 2012-08-22 | Discharge: 2012-08-22 | Disposition: A | Discharge status: Home / Self Care | Payer: Medicaid Other | Attending: Emergency Medicine | Admitting: Emergency Medicine

## 2012-08-22 ENCOUNTER — Encounter (HOSPITAL_COMMUNITY): Payer: Self-pay | Admitting: *Deleted

## 2012-08-22 DIAGNOSIS — J40 Bronchitis, not specified as acute or chronic: Secondary | ICD-10-CM

## 2012-08-22 DIAGNOSIS — Z888 Allergy status to other drugs, medicaments and biological substances status: Secondary | ICD-10-CM | POA: Insufficient documentation

## 2012-08-22 DIAGNOSIS — Z881 Allergy status to other antibiotic agents status: Secondary | ICD-10-CM | POA: Insufficient documentation

## 2012-08-22 DIAGNOSIS — F17200 Nicotine dependence, unspecified, uncomplicated: Secondary | ICD-10-CM | POA: Insufficient documentation

## 2012-08-22 MED ORDER — AZITHROMYCIN 250 MG PO TABS: 250.0000 mg | ORAL_TABLET | Freq: Every day | ORAL | Status: DC

## 2012-08-22 MED ORDER — ALBUTEROL SULFATE HFA 108 (90 BASE) MCG/ACT IN AERS: 2.0000 | INHALATION_SPRAY | RESPIRATORY_TRACT | Status: DC | PRN

## 2012-08-22 NOTE — ED Notes (Signed)
C/o head congestion, chills that started last night, cough this am,

## 2012-08-22 NOTE — ED Provider Notes (Signed)
History  This chart was scribed for Glynn Octave, MD by Erskine Emery. This patient was seen in room APA15/APA15 and the patient's care was started at 9:10.   CSN: [id number]  Arrival date & time 08/22/12  4098   First MD Initiated Contact with Patient 08/22/12 [phone]      Chief Complaint  Patient presents with  . URI    (Consider location/radiation/quality/duration/timing/severity/associated sxs/prior treatment) The history is provided by the patient. No language interpreter was used.  Rebecca [name] is a 26 y.o. female who presents to the Emergency Department complaining of an upper respiratory infection, including chest and head congestion, chills, productive cough (with dark yellow phlegm), weakness, generalized aching, a sore and swollen throat, and difficulty breathing since last night. Pt denies any associated fevers but says she feels "hot and cold." Pt knows of no recent sick contacts. Pt took sudafed and Tylenol severe congestion last night with no relief from symptoms and she hasn't taken anything this morning. Pt is on Deppo for birth control. Pt has been smoking about a hald pack of cigarettes/day for the past 5 years. Pt is on no medications and has no other medical conditions, including asthma.  Dr. Sherwood Gambler is the pt's PCP.  History reviewed. No pertinent past medical history.  History reviewed. No pertinent past surgical history.  No family history on file.  History  Substance Use Topics  . Smoking status: Current Every Day Smoker -- 1.0 packs/day for 5 years    Types: Cigarettes  . Smokeless tobacco: Never Used  . Alcohol Use: No    OB History    Grav Para Term Preterm Abortions TAB SAB Ect Mult Living   2 2 2              Review of Systems A complete 10 system review of systems was obtained and all systems are negative except as noted in the HPI and PMH.    Allergies  Amoxicillin; Cephalosporins; and Septra  Home Medications   Current Outpatient Rx    Name Route Sig Dispense Refill  . ALPRAZOLAM 1 MG PO TABS Oral Take 1 mg by mouth every 4 (four) hours as needed. For anxiety    . HYDROCODONE-ACETAMINOPHEN 10-650 MG PO TABS Oral Take 1 tablet by mouth every 4 (four) hours as needed. For pain    . MEDROXYPROGESTERONE ACETATE 150 MG/ML IM SUSP Intramuscular Inject 150 mg into the muscle every 3 (three) months.    . ADULT MULTIVITAMIN W/MINERALS CH Oral Take 1 tablet by mouth daily.    [name] SINUS SEVERE CONGEST PO Oral Take 2 tablets by mouth as needed. For cold and sinus symptoms    . PSEUDOEPHEDRINE HCL 30 MG PO TABS Oral Take 30 mg by mouth every 4 (four) hours as needed. For congestion      Triage Vitals: BP 115/79  Pulse 102  Temp 98.5 F (36.9 C)  Resp 20  Ht 5\' 1"  (1.549 m)  Wt 143 lb (64.864 kg)  BMI 27.02 kg/m2  SpO2 100%  Physical Exam  Nursing note and vitals reviewed. Constitutional: She is oriented to person, place, and time. She appears well-developed and well-nourished. No distress.  HENT:  Head: Normocephalic and atraumatic.  Right Ear: External ear normal.  Left Ear: External ear normal.  Mouth/Throat: No oropharyngeal exudate.       Mild oropharynx erythema, no exudate, no asymmetry, no meningismus.  Eyes: EOM are normal. Pupils are equal, round, and reactive to light.  Neck:  Normal range of motion. Neck supple. No tracheal deviation present.  Cardiovascular: Normal rate.   Pulmonary/Chest: Effort normal. No respiratory distress.       Lungs are clear.  Abdominal: Soft. She exhibits no distension.  Musculoskeletal: Normal range of motion. She exhibits no edema.  Neurological: She is alert and oriented to person, place, and time.  Skin: Skin is warm and dry.  Psychiatric: She has a normal mood and affect.    ED Course  Procedures (including critical care time) DIAGNOSTIC STUDIES: Oxygen Saturation is 100% on room air, normal by my interpretation.    COORDINATION OF CARE: 9:20--I evaluated the  patient and we discussed a treatment plan including chest x-ray and strep screen to which the pt agreed.   10:00--I rechecked the pt and informed her that her x-rays look fine with no signs of pneumonia.     Labs Reviewed  RAPID STREP SCREEN   Dg Chest 2 View  08/22/2012  *RADIOLOGY REPORT*  Clinical Data: Upper respiratory infection  CHEST - 2 VIEW  Comparison: 09/28/2010  Findings: Cardiomediastinal silhouette is stable.  No acute infiltrate or pulmonary edema.  Bony thorax is stable.  Again noted central mild bronchitic changes.  IMPRESSION: No acute infiltrate or pulmonary edema.  Again noted central mild bronchitic changes.   Original Report Authenticated By: Natasha Mead, M.D.      No diagnosis found.    MDM  One day of cough, congestion, chills and body aches. No fevers. No chest pain or shortness of breath. No syncope.  Chest x-ray negative for pneumonia. She supportively for upper respiratory infection. Smoking cessation encouraged.    I personally performed the services described in this documentation, which was scribed in my presence.  The recorded information has been reviewed and considered.    Glynn Octave, MD 08/22/12 1012

## 2012-12-26 ENCOUNTER — Encounter (HOSPITAL_COMMUNITY): Payer: Self-pay | Admitting: *Deleted

## 2012-12-26 ENCOUNTER — Emergency Department (HOSPITAL_COMMUNITY)
Admission: EM | Admit: 2012-12-26 | Discharge: 2012-12-26 | Disposition: A | Discharge status: Home / Self Care | Payer: Medicaid Other | Attending: Emergency Medicine | Admitting: Emergency Medicine

## 2012-12-26 DIAGNOSIS — Z79899 Other long term (current) drug therapy: Secondary | ICD-10-CM | POA: Insufficient documentation

## 2012-12-26 DIAGNOSIS — F17200 Nicotine dependence, unspecified, uncomplicated: Secondary | ICD-10-CM | POA: Insufficient documentation

## 2012-12-26 DIAGNOSIS — F411 Generalized anxiety disorder: Secondary | ICD-10-CM | POA: Insufficient documentation

## 2012-12-26 DIAGNOSIS — J069 Acute upper respiratory infection, unspecified: Secondary | ICD-10-CM | POA: Insufficient documentation

## 2012-12-26 DIAGNOSIS — R05 Cough: Secondary | ICD-10-CM | POA: Insufficient documentation

## 2012-12-26 DIAGNOSIS — J40 Bronchitis, not specified as acute or chronic: Secondary | ICD-10-CM | POA: Insufficient documentation

## 2012-12-26 DIAGNOSIS — R059 Cough, unspecified: Secondary | ICD-10-CM | POA: Insufficient documentation

## 2012-12-26 DIAGNOSIS — IMO0001 Reserved for inherently not codable concepts without codable children: Secondary | ICD-10-CM | POA: Insufficient documentation

## 2012-12-26 DIAGNOSIS — R52 Pain, unspecified: Secondary | ICD-10-CM | POA: Insufficient documentation

## 2012-12-26 DIAGNOSIS — J029 Acute pharyngitis, unspecified: Secondary | ICD-10-CM | POA: Insufficient documentation

## 2012-12-26 HISTORY — DX: Dorsalgia, unspecified: M54.9

## 2012-12-26 HISTORY — DX: Anxiety disorder, unspecified: F41.9

## 2012-12-26 MED ORDER — PROMETHAZINE-CODEINE 6.25-10 MG/5ML PO SYRP: 5.0000 mL | ORAL_SOLUTION | ORAL | Status: DC | PRN

## 2012-12-26 MED ORDER — METHYLPREDNISOLONE SODIUM SUCC 125 MG IJ SOLR
125.0000 mg | Freq: Once | INTRAMUSCULAR | Status: AC
  Administered 2012-12-26: 125 mg via INTRAMUSCULAR
  Filled 2012-12-26: qty 2

## 2012-12-26 MED ORDER — HYDROCOD POLST-CHLORPHEN POLST 10-8 MG/5ML PO LQCR
5.0000 mL | Freq: Once | ORAL | Status: AC
  Administered 2012-12-26: 5 mL via ORAL
  Filled 2012-12-26: qty 5

## 2012-12-26 MED ORDER — IBUPROFEN 800 MG PO TABS
800.0000 mg | ORAL_TABLET | Freq: Once | ORAL | Status: AC
  Administered 2012-12-26: 800 mg via ORAL
  Filled 2012-12-26: qty 1

## 2012-12-26 NOTE — ED Notes (Signed)
Pt c/o body aches from head to feet, weakness, tired, head congestion, wheezing, started getting sick three weeks ago, has been seen by pcp on Friday, was placed on antibiotics on Monday for ?infection, states that she is not getting any better. Cough is non productive ,

## 2012-12-26 NOTE — ED Provider Notes (Signed)
History     CSN: [id number]  Arrival date & time 12/26/12  1625   First MD Initiated Contact with Patient 12/26/12 1702      Chief Complaint  Patient presents with  . Wheezing    (Consider location/radiation/quality/duration/timing/severity/associated sxs/prior treatment) Patient is a 27 y.o. female presenting with wheezing. The history is provided by the patient.  Wheezing  The current episode started 5 to 7 days ago. The problem occurs frequently. The problem has been gradually worsening. The problem is moderate. Nothing relieves the symptoms. Nothing aggravates the symptoms. Associated symptoms include rhinorrhea, sore throat, cough and wheezing. Pertinent negatives include no chest pain and no shortness of breath. There was no intake of a foreign body. She has not inhaled smoke recently. She has had intermittent steroid use. She has had no prior hospitalizations. She has had no prior ICU admissions. She has had no prior intubations. Urine output has been normal. The last void occurred less than 6 hours ago. There were sick contacts at home. Recently, medical care has been given by the PCP. Services received include medications given.    Past Medical History  Diagnosis Date  . Anxiety   . Back pain     History reviewed. No pertinent past surgical history.  No family history on file.  History  Substance Use Topics  . Smoking status: Current Every Day Smoker -- 1.0 packs/day for 5 years    Types: Cigarettes  . Smokeless tobacco: Never Used  . Alcohol Use: No    OB History    Grav Para Term Preterm Abortions TAB SAB Ect Mult Living   2 2 2              Review of Systems  Constitutional: Negative for activity change.       All ROS Neg except as noted in HPI  HENT: Positive for sore throat and rhinorrhea. Negative for nosebleeds and neck pain.   Eyes: Negative for photophobia and discharge.  Respiratory: Positive for cough and wheezing. Negative for shortness of breath.    Cardiovascular: Negative for chest pain and palpitations.  Gastrointestinal: Negative for abdominal pain and blood in stool.  Genitourinary: Negative for dysuria, frequency and hematuria.  Musculoskeletal: Positive for myalgias. Negative for back pain and arthralgias.  Skin: Negative.   Neurological: Negative for dizziness, seizures and speech difficulty.  Psychiatric/Behavioral: Negative for hallucinations and confusion.    Allergies  Amoxicillin; Cephalosporins; and Septra  Home Medications   Current Outpatient Rx  Name  Route  Sig  Dispense  Refill  . ALBUTEROL SULFATE HFA 108 (90 BASE) MCG/ACT IN AERS   Inhalation   Inhale 2 puffs into the lungs every 4 (four) hours as needed for wheezing.   1 Inhaler   0   . ALPRAZOLAM 1 MG PO TABS   Oral   Take 1 mg by mouth every 4 (four) hours as needed. For anxiety         . AZITHROMYCIN 250 MG PO TABS   Oral   Take 1 tablet (250 mg total) by mouth daily. Take first 2 tablets together, then 1 every day until finished.   6 tablet   0   . HYDROCODONE-ACETAMINOPHEN 10-650 MG PO TABS   Oral   Take 1 tablet by mouth every 4 (four) hours as needed. For pain         . MEDROXYPROGESTERONE ACETATE 150 MG/ML IM SUSP   Intramuscular   Inject 150 mg into the muscle every 3 (  three) months.         . ADULT MULTIVITAMIN W/MINERALS CH   Oral   Take 1 tablet by mouth daily.         [name] SINUS SEVERE CONGEST PO   Oral   Take 2 tablets by mouth as needed. For cold and sinus symptoms         . PSEUDOEPHEDRINE HCL 30 MG PO TABS   Oral   Take 30 mg by mouth every 4 (four) hours as needed. For congestion           BP 120/78  Pulse 78  Temp 98.6 F (37 C) (Oral)  Resp 20  Ht 5\' 1"  (1.549 m)  Wt 148 lb (67.132 kg)  BMI 27.96 kg/m2  SpO2 100%  Physical Exam  Nursing note and vitals reviewed. Constitutional: She is oriented to person, place, and time. She appears well-developed and well-nourished.  Non-toxic  appearance.  HENT:  Head: Normocephalic.  Right Ear: Tympanic membrane and external ear normal.  Left Ear: Tympanic membrane and external ear normal.       Nasal congestion  Eyes: EOM and lids are normal. Pupils are equal, round, and reactive to light.  Neck: Normal range of motion. Neck supple. Carotid bruit is not present.  Cardiovascular: Normal rate, regular rhythm, normal heart sounds, intact distal pulses and normal pulses.   Pulmonary/Chest: Breath sounds normal. No respiratory distress.       Anterior chest rhonchi and wheeze. No retractions. Mild tachypnea.  Abdominal: Soft. Bowel sounds are normal. There is no tenderness. There is no guarding.  Musculoskeletal: Normal range of motion.  Lymphadenopathy:       Head (right side): No submandibular adenopathy present.       Head (left side): No submandibular adenopathy present.    She has no cervical adenopathy.  Neurological: She is alert and oriented to person, place, and time. She has normal strength. No cranial nerve deficit or sensory deficit.  Skin: Skin is warm and dry.  Psychiatric: She has a normal mood and affect. Her speech is normal.    ED Course  Procedures (including critical care time)  Labs Reviewed - No data to display No results found.   No diagnosis found.    MDM  I have reviewed nursing notes, vital signs, and all appropriate lab and imaging results for this patient.  Patient has been having problems with upper respiratory related illness for the past 3 weeks. She has been through 2 rounds of antibiotics she has had a steroid injection, and continues to have problems with not feeling well, cough, and congestion. The vital signs today are well within normal limits. The examination is consistent with bronchitis and upper respiratory infection. The patient has albuterol inhaler which she uses 2 puffs every 4 hours. Steroid taper was offered but the patient states they make her" shake" too much. Prescription  for promethazine cough medication was given, and the patient was advised to continue her Sudafed tablets for congestion. Patient is also advised to increase fluids and wash hands frequently.      Kathie Dike, [place] 12/26/12 1734

## 2012-12-30 NOTE — ED Provider Notes (Signed)
Medical screening examination/treatment/procedure(s) were performed by non-physician practitioner and as supervising physician I was immediately available for consultation/collaboration.  Gerhard Munch, MD 12/30/12 [phone]

## 2013-03-04 ENCOUNTER — Ambulatory Visit (INDEPENDENT_AMBULATORY_CARE_PROVIDER_SITE_OTHER): Payer: Medicaid Other | Admitting: Otolaryngology

## 2013-03-04 DIAGNOSIS — J342 Deviated nasal septum: Secondary | ICD-10-CM

## 2013-03-04 DIAGNOSIS — J320 Chronic maxillary sinusitis: Secondary | ICD-10-CM

## 2013-03-04 DIAGNOSIS — J310 Chronic rhinitis: Secondary | ICD-10-CM

## 2013-03-04 DIAGNOSIS — J343 Hypertrophy of nasal turbinates: Secondary | ICD-10-CM

## 2013-03-08 ENCOUNTER — Other Ambulatory Visit (INDEPENDENT_AMBULATORY_CARE_PROVIDER_SITE_OTHER): Payer: Self-pay | Admitting: Otolaryngology

## 2013-03-08 DIAGNOSIS — J320 Chronic maxillary sinusitis: Secondary | ICD-10-CM

## 2013-03-10 ENCOUNTER — Ambulatory Visit (HOSPITAL_COMMUNITY): Payer: Medicaid Other

## 2013-04-01 ENCOUNTER — Ambulatory Visit (INDEPENDENT_AMBULATORY_CARE_PROVIDER_SITE_OTHER): Payer: Medicaid Other | Admitting: Otolaryngology

## 2013-04-05 ENCOUNTER — Encounter: Payer: Self-pay | Admitting: *Deleted

## 2013-04-05 DIAGNOSIS — F418 Other specified anxiety disorders: Secondary | ICD-10-CM | POA: Insufficient documentation

## 2013-04-05 DIAGNOSIS — F419 Anxiety disorder, unspecified: Secondary | ICD-10-CM

## 2013-04-06 ENCOUNTER — Ambulatory Visit (INDEPENDENT_AMBULATORY_CARE_PROVIDER_SITE_OTHER): Payer: Medicaid Other | Admitting: Obstetrics & Gynecology

## 2013-04-06 VITALS — BP 120/72 | Ht 61.0 in | Wt 142.0 lb

## 2013-04-06 DIAGNOSIS — Z3200 Encounter for pregnancy test, result unknown: Secondary | ICD-10-CM

## 2013-04-06 DIAGNOSIS — Z3049 Encounter for surveillance of other contraceptives: Secondary | ICD-10-CM

## 2013-04-06 DIAGNOSIS — Z309 Encounter for contraceptive management, unspecified: Secondary | ICD-10-CM

## 2013-04-06 DIAGNOSIS — Z3202 Encounter for pregnancy test, result negative: Secondary | ICD-10-CM

## 2013-04-06 MED ORDER — MEDROXYPROGESTERONE ACETATE 150 MG/ML IM SUSP
150.0000 mg | Freq: Once | INTRAMUSCULAR | Status: AC
  Administered 2013-04-06: 150 mg via INTRAMUSCULAR

## 2013-04-07 NOTE — Progress Notes (Signed)
Patient ID: Rebecca [name], female   DOB: 08-Jan-1986, 27 y.o.   MRN: [id number] Depo provera 150 mg IM per pt request

## 2013-04-20 ENCOUNTER — Other Ambulatory Visit (HOSPITAL_COMMUNITY)
Admission: RE | Admit: 2013-04-20 | Discharge: 2013-04-20 | Disposition: A | Discharge status: Home / Self Care | Payer: Medicaid Other | Source: Ambulatory Visit | Attending: Obstetrics and Gynecology | Admitting: Obstetrics and Gynecology

## 2013-04-20 ENCOUNTER — Encounter: Payer: Self-pay | Admitting: Adult Health

## 2013-04-20 ENCOUNTER — Ambulatory Visit (INDEPENDENT_AMBULATORY_CARE_PROVIDER_SITE_OTHER): Payer: Medicaid Other | Admitting: Adult Health

## 2013-04-20 VITALS — BP 120/70 | Ht 60.0 in | Wt 140.0 lb

## 2013-04-20 DIAGNOSIS — N3281 Overactive bladder: Secondary | ICD-10-CM

## 2013-04-20 DIAGNOSIS — Z1389 Encounter for screening for other disorder: Secondary | ICD-10-CM

## 2013-04-20 DIAGNOSIS — Z01419 Encounter for gynecological examination (general) (routine) without abnormal findings: Secondary | ICD-10-CM | POA: Insufficient documentation

## 2013-04-20 DIAGNOSIS — Z309 Encounter for contraceptive management, unspecified: Secondary | ICD-10-CM

## 2013-04-20 DIAGNOSIS — Z0000 Encounter for general adult medical examination without abnormal findings: Secondary | ICD-10-CM

## 2013-04-20 HISTORY — DX: Overactive bladder: N32.81

## 2013-04-20 LAB — POCT URINALYSIS DIPSTICK
Blood, UA: NEGATIVE
Leukocytes, UA: NEGATIVE
Nitrite, UA: NEGATIVE
Protein, UA: NEGATIVE

## 2013-04-20 MED ORDER — MIRABEGRON ER 25 MG PO TB24: 25.0000 mg | ORAL_TABLET | Freq: Every day | ORAL | Status: DC

## 2013-04-20 NOTE — Patient Instructions (Addendum)
Trial myrbetriq Try not to drink while eating  Physical in 1 year  Sign up for my chart

## 2013-04-20 NOTE — Assessment & Plan Note (Signed)
Trial of myrbetriq 

## 2013-04-20 NOTE — Progress Notes (Signed)
Patient ID: [name], female   DOB: 1986/05/29, 27 y.o.   MRN: [id number] History of Present Illness: Rebecca [name] is a 27 year old white female in for a pap and physical. She is complaining of having to go to the bathroom frequently," like every 5 minutes", no burning or pain.  Current Medications, Allergies, Past Medical History, Past Surgical History, Family History and Social History were reviewed in [name].    Review of Systems:Patient denies any headaches, blurred vision, shortness of breath, chest pain, abdominal pain, problems with bowel movements, or intercourse. She does have to void frquently and has discomfort under left rib, no mood changes.   Physical Exam:Blood pressure 120/70, height 5' (1.524 m), weight 140 lb (63.504 kg).Urine dipstick was negative. General:  Well developed, well nourished, no acute distress Skin:  Warm and dry Neck:  Midline trachea, normal thyroid Lungs; Clear to auscultation bilaterally Breast:  No dominant palpable mass, retraction, or nipple discharge Cardiovascular: Regular rate and rhythm Abdomen:  Soft, non tender, no hepatosplenomegaly, she is tender at lower left rib, no swelling, no bruising noted Pelvic:  External genitalia is normal in appearance.  The vagina is normal in appearance. The cervix is bulbous.Pap performed.  Uterus is felt to be normal size, shape, and contour.  No adnexal masses or tenderness noted. Extremities:  No swelling or varicosities noted Psych:  Alert and cooperative, seems to be happy  Impression: Yearly gyn exam OAB Contraceptive management ?bruised rib  Plan: Depo as scheduled Physical in 1 year [place] Myrbetriq 25 mg 1 daily, Number of samples 4  Lot number [id number]   Exp date 6/15 Watch to see if leaning on hard surface when doing job Call in follow up of meds

## 2013-05-02 ENCOUNTER — Encounter (HOSPITAL_COMMUNITY): Payer: Self-pay | Admitting: *Deleted

## 2013-05-02 ENCOUNTER — Emergency Department (HOSPITAL_COMMUNITY)
Admission: EM | Admit: 2013-05-02 | Discharge: 2013-05-03 | Disposition: A | Discharge status: Home / Self Care | Payer: Medicaid Other | Attending: Emergency Medicine | Admitting: Emergency Medicine

## 2013-05-02 DIAGNOSIS — Z79899 Other long term (current) drug therapy: Secondary | ICD-10-CM | POA: Insufficient documentation

## 2013-05-02 DIAGNOSIS — Z7982 Long term (current) use of aspirin: Secondary | ICD-10-CM | POA: Insufficient documentation

## 2013-05-02 DIAGNOSIS — N318 Other neuromuscular dysfunction of bladder: Secondary | ICD-10-CM | POA: Insufficient documentation

## 2013-05-02 DIAGNOSIS — J029 Acute pharyngitis, unspecified: Secondary | ICD-10-CM | POA: Insufficient documentation

## 2013-05-02 DIAGNOSIS — R5381 Other malaise: Secondary | ICD-10-CM | POA: Insufficient documentation

## 2013-05-02 DIAGNOSIS — R109 Unspecified abdominal pain: Secondary | ICD-10-CM | POA: Insufficient documentation

## 2013-05-02 DIAGNOSIS — F17200 Nicotine dependence, unspecified, uncomplicated: Secondary | ICD-10-CM | POA: Insufficient documentation

## 2013-05-02 DIAGNOSIS — F411 Generalized anxiety disorder: Secondary | ICD-10-CM | POA: Insufficient documentation

## 2013-05-02 DIAGNOSIS — K219 Gastro-esophageal reflux disease without esophagitis: Secondary | ICD-10-CM | POA: Insufficient documentation

## 2013-05-02 DIAGNOSIS — M549 Dorsalgia, unspecified: Secondary | ICD-10-CM | POA: Insufficient documentation

## 2013-05-02 DIAGNOSIS — R5383 Other fatigue: Secondary | ICD-10-CM | POA: Insufficient documentation

## 2013-05-02 DIAGNOSIS — R112 Nausea with vomiting, unspecified: Secondary | ICD-10-CM | POA: Insufficient documentation

## 2013-05-02 DIAGNOSIS — R51 Headache: Secondary | ICD-10-CM | POA: Insufficient documentation

## 2013-05-02 LAB — URINALYSIS, ROUTINE W REFLEX MICROSCOPIC
Ketones, ur: NEGATIVE mg/dL
Leukocytes, UA: NEGATIVE
Nitrite: NEGATIVE
Specific Gravity, Urine: 1.015 (ref 1.005–1.030)
Urobilinogen, UA: 0.2 mg/dL (ref 0.0–1.0)
pH: 7 (ref 5.0–8.0)

## 2013-05-02 MED ORDER — SODIUM CHLORIDE 0.9 % IV SOLN: 1000.0000 mL | INTRAVENOUS | Status: DC

## 2013-05-02 MED ORDER — METOCLOPRAMIDE HCL 5 MG/ML IJ SOLN
10.0000 mg | Freq: Once | INTRAMUSCULAR | Status: AC
  Administered 2013-05-02: 10 mg via INTRAVENOUS
  Filled 2013-05-02: qty 2

## 2013-05-02 MED ORDER — DIPHENHYDRAMINE HCL 50 MG/ML IJ SOLN
25.0000 mg | Freq: Once | INTRAMUSCULAR | Status: AC
  Administered 2013-05-02: 25 mg via INTRAVENOUS
  Filled 2013-05-02: qty 1

## 2013-05-02 MED ORDER — SODIUM CHLORIDE 0.9 % IV SOLN
1000.0000 mL | Freq: Once | INTRAVENOUS | Status: AC
  Administered 2013-05-02: 1000 mL via INTRAVENOUS

## 2013-05-02 NOTE — ED Notes (Signed)
Pt c/o left side pain and headache since this morning. Pt denies any injury to area. Pt reports 1 episode of vomiting earlier this afternoon but states she's been able to eat and drink otherwise.

## 2013-05-02 NOTE — ED Provider Notes (Signed)
History  This chart was scribed for Ward Givens, MD by Bennett Scrape, ED Scribe. This patient was seen in room APA16A/APA16A and the patient's care was started at 8:53 PM.  CSN: [id number]  Arrival date & time 05/02/13  2028   First MD Initiated Contact with Patient 05/02/13 2053      Chief Complaint  Patient presents with  . Flank Pain  . Weakness  . Headache  . Emesis  . Nausea     The history is provided by the patient. No language interpreter was used.    HPI Comments: Rebecca [name] is a 27 y.o. female who presents to the Emergency Department complaining of gradual onset, constant, severe HA located behind bilateral eyes described as pressure and throbbing that started last night with associated nausea, one episode of emesis and sore throat. She reports prior episodes of the same and states that she has f/u with a neurologist but was told that medications weren't necessary. She states that normally sleeping is the only way to resolve the symptoms but this method has not improved her symptoms for this episode. Pt states that at baseline she has HAs every other day but reports that this is the first HA in one month. She denies extremity weakness or numbness, fever, rhinorrhea and cough as associated symptoms. She is currently on hydrocodone and xanax for chronic back pain and anxiety.She is a 1.0 ppd smoker but denies alcohol use.   PCP is Dr. Sherwood Gambler.  Past Medical History  Diagnosis Date  . Anxiety   . Back pain   . Reflux   . OAB (overactive bladder) 04/20/2013    History reviewed. No pertinent past surgical history.  Family History  Problem Relation Age of Onset  . Cancer Maternal Grandmother   . Cancer Maternal Grandfather   . CAD Father   . Stroke Father   . Liver disease Father   . Alcohol abuse Father     History  Substance Use Topics  . Smoking status: Current Every Day Smoker -- 1.00 packs/day for 13 years    Types: Cigarettes  . Smokeless tobacco:  Never Used  . 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  HENT: Positive for sore throat. Negative for congestion.   Respiratory: Negative for cough.   Gastrointestinal: Positive for nausea and vomiting. Negative for abdominal pain and diarrhea.  Neurological: Positive for headaches. Negative for dizziness and light-headedness.  All other systems reviewed and are negative.    Allergies  Amoxicillin; Cephalosporins; and Septra  Home Medications   Current Outpatient Rx  Name  Route  Sig  Dispense  Refill  . ALPRAZolam (XANAX) 1 MG tablet   Oral   Take 1 mg by mouth every 4 (four) hours as needed. For anxiety         . aspirin-sod bicarb-citric acid (ALKA-SELTZER) 325 MG TBEF   Oral   Take 325 mg by mouth daily as needed (for pain).         Marland Kitchen HYDROcodone-acetaminophen (NORCO) 10-325 MG per tablet   Oral   Take 1 tablet by mouth every 6 (six) hours as needed for pain.         [place] CAPS   Oral   Take  1 capsule by mouth daily.         . mirabegron ER (MYRBETRIQ) 25 MG TB24   Oral   Take 1 tablet (25 mg total) by mouth daily.   28 tablet   0   . Multiple Vitamin (MULTIVITAMIN WITH MINERALS) TABS   Oral   Take 1 tablet by mouth daily.         . Omega-3 Fatty Acids (FISH OIL BURP-LESS PO)   Oral   Take 1 capsule by mouth daily.         . ranitidine (ZANTAC) 150 MG capsule   Oral   Take 150 mg by mouth 2 (two) times daily.         . medroxyPROGESTERone (DEPO-PROVERA) 150 MG/ML injection   Intramuscular   Inject 150 mg into the muscle every 3 (three) months.           Triage Vitals: BP 119/88  Pulse 87  Temp(Src) 98.2 F (36.8 C) (Oral)  Resp 16  Ht 5\' 1"  (1.549 m)  Wt 140 lb (63.504 kg)  BMI 26.47 kg/m2  SpO2 100%  Vital signs normal    Physical Exam  Nursing note and vitals  reviewed. Constitutional: She is oriented to person, place, and time. She appears well-developed and well-nourished.  Non-toxic appearance. She does not appear ill. No distress.  HENT:  Head: Normocephalic and atraumatic.  Right Ear: External ear normal.  Left Ear: External ear normal.  Nose: Nose normal. No mucosal edema or rhinorrhea.  Mouth/Throat: Oropharynx is clear and moist and mucous membranes are normal. No dental abscesses or edematous.  Enlarged and mildly erythematous tonsils that she states "always look that way", mild tenderness over maxillary sinuses  Eyes: Conjunctivae and EOM are normal. Pupils are equal, round, and reactive to light.  Neck: Normal range of motion and full passive range of motion without pain. Neck supple.  Cardiovascular: Normal rate, regular rhythm and normal heart sounds.  Exam reveals no gallop and no friction rub.   No murmur heard. Pulmonary/Chest: Effort normal and breath sounds normal. No respiratory distress. She has no wheezes. She has no rhonchi. She has no rales. She exhibits no tenderness and no crepitus.  Abdominal: Soft. Normal appearance and bowel sounds are normal. She exhibits no distension. There is no tenderness. There is no rebound and no guarding.  Musculoskeletal: Normal range of motion. She exhibits no edema and no tenderness.  Moves all extremities well.   Neurological: She is alert and oriented to person, place, and time. She has normal strength. No cranial nerve deficit.  Skin: Skin is warm, dry and intact. No rash noted. No erythema. No pallor.  Psychiatric: She has a normal mood and affect. Her speech is normal and behavior is normal. Her mood appears not anxious.    ED Course  Procedures (including critical care time)  Medications  0.9 %  sodium chloride infusion (1,000 mLs Intravenous New Bag/Given 05/02/13 2217)    Followed by  0.9 %  sodium chloride infusion (not administered)  metoCLOPramide (REGLAN) injection 10 mg (10 mg  Intravenous Given 05/02/13 2218)  diphenhydrAMINE (BENADRYL) injection 25 mg (25 mg Intravenous Given 05/02/13 2217)    DIAGNOSTIC STUDIES: Oxygen Saturation is 100% on room air, normal by my interpretation.    COORDINATION OF CARE: 9:53 PM-Discussed treatment plan which includes medications and UA with pt at bedside and pt agreed to plan.   10:58 PM-Informed pt of negative lab work results. Discussed discharge plan with pt  and pt agreed to plan. Also advised pt to follow up as needed and pt agreed. Addressed symptoms to return for with pt.  Pt states her headache is gone.     Results for orders placed during the hospital encounter of 05/02/13  URINALYSIS, ROUTINE W REFLEX MICROSCOPIC      Result Value Range   Color, Urine YELLOW  YELLOW   APPearance CLEAR  CLEAR   Specific Gravity, Urine 1.015  1.005 - 1.030   pH 7.0  5.0 - 8.0   Glucose, UA NEGATIVE  NEGATIVE mg/dL   Hgb urine dipstick NEGATIVE  NEGATIVE   Bilirubin Urine NEGATIVE  NEGATIVE   Ketones, ur NEGATIVE  NEGATIVE mg/dL   Protein, ur NEGATIVE  NEGATIVE mg/dL   Urobilinogen, UA 0.2  0.0 - 1.0 mg/dL   Nitrite NEGATIVE  NEGATIVE   Leukocytes, UA NEGATIVE  NEGATIVE   Laboratory interpretation all normal     1. Headache     Plan discharge  Devoria Albe, MD, FACEP     MDM    I personally performed the services described in this documentation, which was scribed in my presence. The recorded information has been reviewed and considered.  Devoria Albe, MD, Armando Gang    Ward Givens, MD 05/02/13 [phone]

## 2013-05-02 NOTE — ED Notes (Signed)
Pt c/o feeling weak, having a headache, left flank, and n/v since earlier today.

## 2013-05-12 ENCOUNTER — Telehealth: Payer: Self-pay | Admitting: *Deleted

## 2013-05-12 NOTE — Telephone Encounter (Signed)
Pt requesting prescription for Myrbetriq 25 mg, states used samples and worked good.

## 2013-05-13 ENCOUNTER — Telehealth: Payer: Self-pay | Admitting: Adult Health

## 2013-05-13 MED ORDER — MIRABEGRON ER 25 MG PO TB24: 25.0000 mg | ORAL_TABLET | Freq: Every day | ORAL | Status: DC

## 2013-05-13 NOTE — Telephone Encounter (Signed)
Left message with man, I did what she ask.

## 2013-05-13 NOTE — Telephone Encounter (Signed)
Myrbetriq 25 mg e-scribed.

## 2013-06-29 ENCOUNTER — Ambulatory Visit (INDEPENDENT_AMBULATORY_CARE_PROVIDER_SITE_OTHER): Payer: Medicaid Other | Admitting: Adult Health

## 2013-06-29 ENCOUNTER — Encounter: Payer: Self-pay | Admitting: Adult Health

## 2013-06-29 VITALS — BP 120/80 | Ht 61.0 in | Wt 143.0 lb

## 2013-06-29 DIAGNOSIS — Z309 Encounter for contraceptive management, unspecified: Secondary | ICD-10-CM

## 2013-06-29 DIAGNOSIS — Z3202 Encounter for pregnancy test, result negative: Secondary | ICD-10-CM

## 2013-06-29 DIAGNOSIS — Z3200 Encounter for pregnancy test, result unknown: Secondary | ICD-10-CM

## 2013-06-29 DIAGNOSIS — Z3049 Encounter for surveillance of other contraceptives: Secondary | ICD-10-CM

## 2013-06-29 DIAGNOSIS — J320 Chronic maxillary sinusitis: Secondary | ICD-10-CM

## 2013-06-29 MED ORDER — MEDROXYPROGESTERONE ACETATE 150 MG/ML IM SUSP
150.0000 mg | Freq: Once | INTRAMUSCULAR | Status: AC
  Administered 2013-06-29: 150 mg via INTRAMUSCULAR

## 2013-08-12 ENCOUNTER — Ambulatory Visit (INDEPENDENT_AMBULATORY_CARE_PROVIDER_SITE_OTHER): Payer: Medicaid Other | Admitting: Otolaryngology

## 2013-08-12 DIAGNOSIS — J342 Deviated nasal septum: Secondary | ICD-10-CM

## 2013-08-12 DIAGNOSIS — J343 Hypertrophy of nasal turbinates: Secondary | ICD-10-CM

## 2013-08-13 ENCOUNTER — Other Ambulatory Visit (INDEPENDENT_AMBULATORY_CARE_PROVIDER_SITE_OTHER): Payer: Self-pay | Admitting: Otolaryngology

## 2013-08-13 DIAGNOSIS — J329 Chronic sinusitis, unspecified: Secondary | ICD-10-CM

## 2013-08-17 ENCOUNTER — Ambulatory Visit (HOSPITAL_COMMUNITY)
Admission: RE | Admit: 2013-08-17 | Discharge: 2013-08-17 | Disposition: A | Discharge status: Home / Self Care | Payer: Medicaid Other | Source: Ambulatory Visit | Attending: Otolaryngology | Admitting: Otolaryngology

## 2013-08-17 DIAGNOSIS — R51 Headache: Secondary | ICD-10-CM | POA: Insufficient documentation

## 2013-08-17 DIAGNOSIS — J329 Chronic sinusitis, unspecified: Secondary | ICD-10-CM | POA: Insufficient documentation

## 2013-08-30 ENCOUNTER — Encounter: Payer: Self-pay | Admitting: Adult Health

## 2013-08-30 ENCOUNTER — Ambulatory Visit (INDEPENDENT_AMBULATORY_CARE_PROVIDER_SITE_OTHER): Payer: Medicaid Other | Admitting: Adult Health

## 2013-08-30 VITALS — BP 100/60 | Ht 61.0 in | Wt 143.0 lb

## 2013-08-30 DIAGNOSIS — Z309 Encounter for contraceptive management, unspecified: Secondary | ICD-10-CM

## 2013-08-30 DIAGNOSIS — Z3049 Encounter for surveillance of other contraceptives: Secondary | ICD-10-CM

## 2013-08-30 HISTORY — DX: Encounter for contraceptive management, unspecified: Z30.9

## 2013-08-30 MED ORDER — NORETHIN ACE-ETH ESTRAD-FE 1-20 MG-MCG(24) PO CHEW: 1.0000 | CHEWABLE_TABLET | Freq: Every day | ORAL | Status: DC

## 2013-08-30 NOTE — Progress Notes (Signed)
Subjective:     Patient ID: Rebecca [name], female   DOB: [birth year], 27 y.o.   MRN: [id number]  HPI Rebecca [name] is in to change her birth Rebecca [name] has been on depo and wants to try the pill.She complains of weight gain and bloating and no period for 5 years.She has no DVT history or breast cancer or migraines with aura.She wants a period and to lose weight, may get tubes tied at 29.  Review of Systems See HPI Reviewed past medical,surgical, social and family history. Reviewed medications and allergies.     Objective:   Physical Exam BP 100/60  Ht 5\' 1"  (1.549 m)  Wt 143 lb (64.864 kg)  BMI 27.03 kg/m2   Discussed the pill options and she wants to try them.  Assessment:     Contraceptive management    Plan:     Rx minasrtin disp 1 pack take 1 daily with 12 refills, start this Sunday Follow up in 8 weeks, if still with symptoms check TSH and get [place]

## 2013-08-30 NOTE — Patient Instructions (Addendum)
Start minastrin  Sunday Follow up in 8 weeks

## 2013-09-02 ENCOUNTER — Ambulatory Visit (INDEPENDENT_AMBULATORY_CARE_PROVIDER_SITE_OTHER): Payer: Medicaid Other | Admitting: Otolaryngology

## 2013-09-02 DIAGNOSIS — J320 Chronic maxillary sinusitis: Secondary | ICD-10-CM

## 2013-09-02 DIAGNOSIS — J310 Chronic rhinitis: Secondary | ICD-10-CM

## 2013-09-02 DIAGNOSIS — J343 Hypertrophy of nasal turbinates: Secondary | ICD-10-CM

## 2013-09-14 ENCOUNTER — Encounter (HOSPITAL_BASED_OUTPATIENT_CLINIC_OR_DEPARTMENT_OTHER): Payer: Self-pay | Admitting: *Deleted

## 2013-09-14 NOTE — Progress Notes (Signed)
Bring all medications

## 2013-09-20 ENCOUNTER — Ambulatory Visit (HOSPITAL_BASED_OUTPATIENT_CLINIC_OR_DEPARTMENT_OTHER): Admission: RE | Admit: 2013-09-20 | Payer: Medicaid Other | Source: Ambulatory Visit | Admitting: Otolaryngology

## 2013-09-20 HISTORY — DX: Gastro-esophageal reflux disease without esophagitis: K21.9

## 2013-09-20 SURGERY — TURBINATE RESECTION
Anesthesia: General | Laterality: Left

## 2013-09-20 MED ORDER — MIDAZOLAM HCL 2 MG/2ML IJ SOLN
INTRAMUSCULAR | Status: AC
  Filled 2013-09-20: qty 2

## 2013-09-20 MED ORDER — PROPOFOL 10 MG/ML IV EMUL
INTRAVENOUS | Status: AC
  Filled 2013-09-20: qty 50

## 2013-09-20 MED ORDER — FENTANYL CITRATE 0.05 MG/ML IJ SOLN
INTRAMUSCULAR | Status: AC
  Filled 2013-09-20: qty 6

## 2013-09-21 ENCOUNTER — Ambulatory Visit: Payer: Medicaid Other

## 2013-10-06 ENCOUNTER — Encounter (HOSPITAL_COMMUNITY): Payer: Self-pay | Admitting: Emergency Medicine

## 2013-10-06 ENCOUNTER — Emergency Department (HOSPITAL_COMMUNITY)
Admission: EM | Admit: 2013-10-06 | Discharge: 2013-10-06 | Disposition: A | Discharge status: Home / Self Care | Payer: Medicaid Other | Attending: Emergency Medicine | Admitting: Emergency Medicine

## 2013-10-06 DIAGNOSIS — J039 Acute tonsillitis, unspecified: Secondary | ICD-10-CM

## 2013-10-06 DIAGNOSIS — R5381 Other malaise: Secondary | ICD-10-CM | POA: Insufficient documentation

## 2013-10-06 DIAGNOSIS — J3489 Other specified disorders of nose and nasal sinuses: Secondary | ICD-10-CM | POA: Insufficient documentation

## 2013-10-06 DIAGNOSIS — Z79899 Other long term (current) drug therapy: Secondary | ICD-10-CM | POA: Insufficient documentation

## 2013-10-06 DIAGNOSIS — F17200 Nicotine dependence, unspecified, uncomplicated: Secondary | ICD-10-CM | POA: Insufficient documentation

## 2013-10-06 DIAGNOSIS — Z87448 Personal history of other diseases of urinary system: Secondary | ICD-10-CM | POA: Insufficient documentation

## 2013-10-06 DIAGNOSIS — R509 Fever, unspecified: Secondary | ICD-10-CM | POA: Insufficient documentation

## 2013-10-06 DIAGNOSIS — R0602 Shortness of breath: Secondary | ICD-10-CM | POA: Insufficient documentation

## 2013-10-06 DIAGNOSIS — H9209 Otalgia, unspecified ear: Secondary | ICD-10-CM | POA: Insufficient documentation

## 2013-10-06 DIAGNOSIS — R1110 Vomiting, unspecified: Secondary | ICD-10-CM | POA: Insufficient documentation

## 2013-10-06 DIAGNOSIS — R197 Diarrhea, unspecified: Secondary | ICD-10-CM | POA: Insufficient documentation

## 2013-10-06 DIAGNOSIS — R062 Wheezing: Secondary | ICD-10-CM | POA: Insufficient documentation

## 2013-10-06 DIAGNOSIS — Z880 Allergy status to penicillin: Secondary | ICD-10-CM | POA: Insufficient documentation

## 2013-10-06 DIAGNOSIS — K219 Gastro-esophageal reflux disease without esophagitis: Secondary | ICD-10-CM | POA: Insufficient documentation

## 2013-10-06 DIAGNOSIS — IMO0001 Reserved for inherently not codable concepts without codable children: Secondary | ICD-10-CM | POA: Insufficient documentation

## 2013-10-06 DIAGNOSIS — F411 Generalized anxiety disorder: Secondary | ICD-10-CM | POA: Insufficient documentation

## 2013-10-06 MED ORDER — IBUPROFEN 800 MG PO TABS
800.0000 mg | ORAL_TABLET | Freq: Once | ORAL | Status: AC
  Administered 2013-10-06: 800 mg via ORAL
  Filled 2013-10-06: qty 1

## 2013-10-06 MED ORDER — AZITHROMYCIN 250 MG PO TABS
500.0000 mg | ORAL_TABLET | Freq: Once | ORAL | Status: AC
  Administered 2013-10-06: 500 mg via ORAL
  Filled 2013-10-06: qty 2

## 2013-10-06 MED ORDER — HYDROCODONE-ACETAMINOPHEN 5-325 MG PO TABS: 1.0000 | ORAL_TABLET | Freq: Four times a day (QID) | ORAL | Status: DC | PRN

## 2013-10-06 MED ORDER — IBUPROFEN 600 MG PO TABS: 600.0000 mg | ORAL_TABLET | Freq: Four times a day (QID) | ORAL | Status: DC | PRN

## 2013-10-06 MED ORDER — AZITHROMYCIN 250 MG PO TABS: 250.0000 mg | ORAL_TABLET | Freq: Every day | ORAL | Status: DC

## 2013-10-06 NOTE — ED Notes (Signed)
Pt alert & oriented x4, stable gait. Patient given discharge instructions, paperwork & prescription(s). Patient  instructed to stop at the registration desk to finish any additional paperwork. Patient verbalized understanding. Pt left department w/ no further questions. 

## 2013-10-06 NOTE — ED Notes (Signed)
Sore throat, head and chest congestion, wheezing, feel like I can't breathe per pt. Also having nausea, vomiting, and diarrhea per pt. Started getting sick this morning.

## 2013-10-09 NOTE — ED Provider Notes (Signed)
CSN: [id number]     Arrival date & time 10/06/13  1931 History   First MD Initiated Contact with Patient 10/06/13 2231     Chief Complaint  Patient presents with  . Sore Throat  . Emesis  . Diarrhea   (Consider location/radiation/quality/duration/timing/severity/associated sxs/prior Treatment) HPI Comments: Rebecca [name] is a 27 y.o. Female who woke today  with sore throat with moderately enlarged tonsils causing pain with swallowing and attempting to eat.  Her nasal congestion causes shortness of breath while trying to eat and drink.  She has bilateral ear pain without drainage or decreased hearing acuity and has generalized myalgias, fatigue and  reports wheezing early today, denies cough and chest pain.  She also reports having one episode of emesis and diarrhea early this morning but with no further episodes of these symptoms.  She has taken a hydrocodone tablet with temporary improved pain.    The history is provided by the patient.    Past Medical History  Diagnosis Date  . Anxiety   . Back pain   . Reflux   . OAB (overactive bladder) 04/20/2013  . Contraceptive management 08/30/2013  . GERD (gastroesophageal reflux disease)    History reviewed. No pertinent past surgical history. Family History  Problem Relation Age of Onset  . Cancer Maternal Grandmother   . Cancer Maternal Grandfather   . CAD Father   . Stroke Father   . Liver disease Father   . Alcohol abuse Father    History  Substance Use Topics  . Smoking status: Current Every Day Smoker -- 1.00 packs/day for 13 years    Types: Cigarettes  . Smokeless tobacco: Never Used     Comment: Smokes 1 pack of cigarettes daily  . Alcohol Use: No   OB History   Grav Para Term Preterm Abortions TAB SAB Ect Mult Living   2 2 2             Review of Systems  Constitutional: Positive for fever and fatigue.  HENT: Positive for congestion, ear pain and sore throat. Negative for rhinorrhea, sinus pressure, trouble  swallowing and voice change.   Eyes: Negative for discharge.  Respiratory: Positive for wheezing. Negative for cough, shortness of breath and stridor.   Cardiovascular: Negative for chest pain.  Gastrointestinal: Positive for vomiting and diarrhea. Negative for abdominal pain.  Genitourinary: Negative.   Musculoskeletal: Positive for myalgias.  Skin: Negative.     Allergies  Amoxicillin; Cephalosporins; Septra; Ciprocinonide; and Penicillins  Home Medications   Current Outpatient Rx  Name  Route  Sig  Dispense  Refill  . ALPRAZolam (XANAX) 1 MG tablet   Oral   Take 2 mg by mouth 4 (four) times daily. For anxiety         . aspirin-sod bicarb-citric acid (ALKA-SELTZER) 325 MG TBEF   Oral   Take 325 mg by mouth daily as needed (for pain).         Marland Kitchen azithromycin (ZITHROMAX Z-PAK) 250 MG tablet   Oral   Take 1 tablet (250 mg total) by mouth daily.   4 tablet   0   . HYDROcodone-acetaminophen (NORCO) 10-325 MG per tablet   Oral   Take 1 tablet by mouth 4 (four) times daily.          Marland Kitchen HYDROcodone-acetaminophen (NORCO/VICODIN) 5-325 MG per tablet   Oral   Take 1 tablet by mouth every 6 (six) hours as needed for moderate pain.   6 tablet   0   .  ibuprofen (ADVIL,MOTRIN) 600 MG tablet   Oral   Take 1 tablet (600 mg total) by mouth every 6 (six) hours as needed.   30 tablet   0   . Krill Oil CAPS   Oral   Take 2 capsules by mouth every morning.          . Multiple Vitamin (MULTIVITAMIN WITH MINERALS) TABS   Oral   Take 2 tablets by mouth daily.          . Norethin Ace-Eth Estrad-FE (MINASTRIN 24 FE) 1-20 MG-MCG(24) CHEW   Oral   Chew 1 tablet by mouth daily.   28 tablet   12   . ranitidine (ZANTAC) 150 MG capsule   Oral   Take 150 mg by mouth 2 (two) times daily.          BP 137/76  Pulse 89  Temp(Src) 98.4 F (36.9 C) (Oral)  Resp 20  Ht 5\' 1"  (1.549 m)  Wt 148 lb (67.132 kg)  BMI 27.98 kg/m2  SpO2 100%  LMP 10/01/2013 Physical Exam   Constitutional: She is oriented to person, place, and time. She appears well-developed and well-nourished.  HENT:  Head: Normocephalic and atraumatic.  Right Ear: Tympanic membrane and ear canal normal.  Left Ear: Tympanic membrane and ear canal normal.  Nose: Mucosal edema present. No rhinorrhea.  Mouth/Throat: Uvula is midline and mucous membranes are normal. No trismus in the jaw. Posterior oropharyngeal edema present. No oropharyngeal exudate, posterior oropharyngeal erythema or tonsillar abscesses.  Bilateral tonsillar hypertrophy, 2 edema, uvula midline,  Symmetrical.    Eyes: Conjunctivae are normal.  Neck: Normal range of motion.  Cardiovascular: Normal rate and normal heart sounds.   Pulmonary/Chest: Effort normal and breath sounds normal. No respiratory distress. She has no wheezes. She has no rhonchi. She has no rales.  Abdominal: Soft. Bowel sounds are normal. There is no tenderness.  Musculoskeletal: Normal range of motion.  Neurological: She is alert and oriented to person, place, and time.  Skin: Skin is warm and dry. No rash noted.  Psychiatric: She has a normal mood and affect.    ED Course  Procedures (including critical care time) Labs Review Labs Reviewed - No data to display Imaging Review No results found.  EKG Interpretation   None       MDM   1. Acute tonsillitis    Pt with acute tonsillitis on exam,  No respiratory distress, no wheeze or stridor.  Normal vital signs.  She was prescribed zithromax,  First dose given here,  Encouraged ibuprofen for throat pain/ reduction of tonsillar hypertrophy.  Also prescribed hydrocodone for pain relief.  Encouraged recheck by pcp if not improving.  Throat lozenges, tea with honey/ salt water gargles.    The patient appears reasonably screened and/or stabilized for discharge and I doubt any other medical condition or other [facility] requiring further screening, evaluation, or treatment in the ED at this time prior to  discharge.     Burgess Amor, PA-C 10/09/13 [phone]

## 2013-10-12 NOTE — ED Provider Notes (Signed)
Medical screening examination/treatment/procedure(s) were performed by non-physician practitioner and as supervising physician I was immediately available for consultation/collaboration.  EKG Interpretation   None        Brain Honeycutt M Chardae Mulkern, MD 10/12/13 0900 

## 2013-10-25 ENCOUNTER — Ambulatory Visit (INDEPENDENT_AMBULATORY_CARE_PROVIDER_SITE_OTHER): Payer: Medicaid Other | Admitting: Adult Health

## 2013-10-25 ENCOUNTER — Encounter: Payer: Self-pay | Admitting: Adult Health

## 2013-10-25 VITALS — BP 110/60 | Ht 61.0 in | Wt 145.0 lb

## 2013-10-25 DIAGNOSIS — Z3049 Encounter for surveillance of other contraceptives: Secondary | ICD-10-CM

## 2013-10-25 DIAGNOSIS — Z309 Encounter for contraceptive management, unspecified: Secondary | ICD-10-CM

## 2013-10-25 DIAGNOSIS — N644 Mastodynia: Secondary | ICD-10-CM

## 2013-10-25 HISTORY — DX: Mastodynia: N64.4

## 2013-10-25 MED FILL — Hydrocodone-Acetaminophen Tab 5-325 MG: ORAL | Qty: 6 | Status: AC

## 2013-10-25 NOTE — Progress Notes (Signed)
Subjective:     Patient ID: Rebecca [name], female   DOB: 01-Jun-1986, 27 y.o.   MRN: [id number]  HPI [name] is back in to see if she feels better on the pill and assess periods.She says she feels better and had a period in October, but she complains of some breast soreness left breast.  Review of Systems See HPI Reviewed past medical,surgical, social and family history. Reviewed medications and allergies.     Objective:   Physical Exam BP 110/60  Ht 5\' 1"  (1.549 m)  Wt 145 lb (65.772 kg)  BMI 27.41 kg/m2  LMP 10/01/2013    Skin warm and dry,  Breasts:no dominate palpable mass, retraction or nipple discharge  Assessment:     Contraceptive management Breast soreness    Plan:    Continue minastrin Decrease caffeine Follow up prn

## 2013-10-25 NOTE — Patient Instructions (Signed)
Follow up prn Decrease caffeine

## 2013-12-27 ENCOUNTER — Other Ambulatory Visit (HOSPITAL_COMMUNITY): Payer: Self-pay | Admitting: Internal Medicine

## 2013-12-27 DIAGNOSIS — R102 Pelvic and perineal pain: Secondary | ICD-10-CM

## 2013-12-27 DIAGNOSIS — R109 Unspecified abdominal pain: Secondary | ICD-10-CM

## 2013-12-29 ENCOUNTER — Ambulatory Visit (HOSPITAL_COMMUNITY)
Admission: RE | Admit: 2013-12-29 | Discharge: 2013-12-29 | Disposition: A | Discharge status: Home / Self Care | Payer: Medicaid Other | Source: Ambulatory Visit | Attending: Internal Medicine | Admitting: Internal Medicine

## 2013-12-29 DIAGNOSIS — R102 Pelvic and perineal pain: Secondary | ICD-10-CM

## 2013-12-29 DIAGNOSIS — R109 Unspecified abdominal pain: Secondary | ICD-10-CM | POA: Insufficient documentation

## 2013-12-29 MED ORDER — IOHEXOL 300 MG/ML  SOLN
100.0000 mL | Freq: Once | INTRAMUSCULAR | Status: AC | PRN
  Administered 2013-12-29: 100 mL via INTRAVENOUS

## 2014-02-17 ENCOUNTER — Telehealth: Payer: Self-pay | Admitting: Adult Health

## 2014-02-17 ENCOUNTER — Telehealth: Payer: Self-pay | Admitting: *Deleted

## 2014-02-17 NOTE — Telephone Encounter (Signed)
Spoke with pt. Pt states Medicaid no longer covers Minastrin. Pt wants to start back on Depo shots. Call transferred to front desk to schedule an appt. JSY

## 2014-02-17 NOTE — Telephone Encounter (Signed)
[facility] stating Minastrin 24 FE no longer covered on Medicaid can you change?

## 2014-02-18 MED ORDER — NORETHINDRONE ACET-ETHINYL EST 1-20 MG-MCG PO TABS: 1.0000 | ORAL_TABLET | Freq: Every day | ORAL | Status: DC

## 2014-02-18 NOTE — Telephone Encounter (Signed)
Pt said medicaid wil not cover minastrin will change to junel 1/20 to see if that is covered

## 2014-02-22 ENCOUNTER — Ambulatory Visit: Payer: Medicaid Other | Admitting: Adult Health

## 2014-03-10 ENCOUNTER — Telehealth: Payer: Self-pay | Admitting: *Deleted

## 2014-03-10 NOTE — Telephone Encounter (Signed)
Pt states got her BC filled on 02/18/2014 but did not start taking until 02/19/2014, Pt states only has 2 pills left but pharmacy states cannot get refilled for 5 days. Pt states has not had a period so [facility]. Please advise.

## 2014-03-10 NOTE — Telephone Encounter (Signed)
Gets 21 day pill pack will have 7 days off then start new pack

## 2014-04-18 ENCOUNTER — Emergency Department (HOSPITAL_COMMUNITY)
Admission: EM | Admit: 2014-04-18 | Discharge: 2014-04-18 | Disposition: A | Discharge status: Home / Self Care | Payer: Medicaid Other | Attending: Emergency Medicine | Admitting: Emergency Medicine

## 2014-04-18 ENCOUNTER — Encounter (HOSPITAL_COMMUNITY): Payer: Self-pay | Admitting: Emergency Medicine

## 2014-04-18 DIAGNOSIS — N898 Other specified noninflammatory disorders of vagina: Secondary | ICD-10-CM | POA: Diagnosis not present

## 2014-04-18 DIAGNOSIS — Z87448 Personal history of other diseases of urinary system: Secondary | ICD-10-CM | POA: Diagnosis not present

## 2014-04-18 DIAGNOSIS — R202 Paresthesia of skin: Secondary | ICD-10-CM

## 2014-04-18 DIAGNOSIS — F17200 Nicotine dependence, unspecified, uncomplicated: Secondary | ICD-10-CM | POA: Diagnosis not present

## 2014-04-18 DIAGNOSIS — K219 Gastro-esophageal reflux disease without esophagitis: Secondary | ICD-10-CM | POA: Insufficient documentation

## 2014-04-18 DIAGNOSIS — R609 Edema, unspecified: Secondary | ICD-10-CM | POA: Insufficient documentation

## 2014-04-18 DIAGNOSIS — N12 Tubulo-interstitial nephritis, not specified as acute or chronic: Secondary | ICD-10-CM | POA: Diagnosis present

## 2014-04-18 DIAGNOSIS — Z79899 Other long term (current) drug therapy: Secondary | ICD-10-CM | POA: Diagnosis not present

## 2014-04-18 DIAGNOSIS — R300 Dysuria: Secondary | ICD-10-CM | POA: Diagnosis not present

## 2014-04-18 DIAGNOSIS — F411 Generalized anxiety disorder: Secondary | ICD-10-CM | POA: Diagnosis not present

## 2014-04-18 DIAGNOSIS — R209 Unspecified disturbances of skin sensation: Secondary | ICD-10-CM | POA: Diagnosis not present

## 2014-04-18 DIAGNOSIS — Z880 Allergy status to penicillin: Secondary | ICD-10-CM | POA: Diagnosis not present

## 2014-04-18 LAB — WET PREP, GENITAL
CLUE CELLS WET PREP: NONE SEEN
Trich, Wet Prep: NONE SEEN
Yeast Wet Prep HPF POC: NONE SEEN

## 2014-04-18 LAB — BASIC METABOLIC PANEL
BUN: 8 mg/dL (ref 6–23)
CALCIUM: 9.1 mg/dL (ref 8.4–10.5)
CO2: 25 mEq/L (ref 19–32)
Chloride: 101 mEq/L (ref 96–112)
Creatinine, Ser: 0.77 mg/dL (ref 0.50–1.10)
GFR calc Af Amer: >90 mL/min (ref >90)
GLUCOSE: 104 mg/dL — AB (ref 70–99)
Potassium: 3.5 mEq/L — ABNORMAL LOW (ref 3.7–5.3)
SODIUM: 138 meq/L (ref 137–147)

## 2014-04-18 NOTE — Discharge Instructions (Signed)
Return here for weakness to one side of your body, slurred speech, trouble with your balance, or any other problems. Call the neurologist to schedule an appointment to be seen

## 2014-04-18 NOTE — ED Provider Notes (Signed)
CSN: [id number]     Arrival date & time 04/18/14  2046 History   First MD Initiated Contact with Patient 04/18/14 2125 This chart was scribed for Toy BakerAnthony T Cincere Zorn, MD by Valera CastleSteven Perry, ED Scribe. This patient was seen in room APA08/APA08 and the patient's care was started at 9:28 PM.    No chief complaint on file.  (Consider location/radiation/quality/duration/timing/severity/associated sxs/prior Treatment) The history is provided by the patient. No language interpreter was used.   HPI Comments: Rebecca [name] is a 28 y.o. female who presents to the Emergency Department complaining of constant tingling in her bilateral elbows to her fingers and in her bilateral LE, from her knees to her ankles, with associated swelling in her UE and LE, worse in her right LE, onset 2 days ago. She denies anything exacerbating or relieving her symptoms.  She reports stinging with urination and a foul odor to her urine. She reports minimal vaginal discharge earlier today, with foul odor. She reports LNMP was last week. She reports being on new birth control. She states she recently had UA done ruling out UTI. She reports being on antibiotic for URI. She denies any other associated symptoms.   PCP - Cassell SmilesFUSCO,LAWRENCE J., MD  Past Medical History  Diagnosis Date  . Anxiety   . Back pain   . Reflux   . OAB (overactive bladder) 04/20/2013  . Contraceptive management 08/30/2013  . GERD (gastroesophageal reflux disease)   . Soreness breast 10/25/2013    Left breast sore UOQ, no masses   History reviewed. No pertinent past surgical history. Family History  Problem Relation Age of Onset  . Cancer Maternal Grandmother   . Cancer Maternal Grandfather   . CAD Father   . Stroke Father   . Liver disease Father   . Alcohol abuse Father    History  Substance Use Topics  . Smoking status: Current Every Day Smoker -- 1.00 packs/day for 13 years    Types: Cigarettes  . Smokeless tobacco: Never Used     Comment:  Smokes 1 pack of cigarettes daily  . Alcohol Use: No   OB History   Grav Para Term Preterm Abortions TAB SAB Ect Mult Living   2 2 2             Review of Systems  Constitutional: Negative for fever.  Cardiovascular: Positive for leg swelling (bilaterally, worse on right, and bilateral UE).  Genitourinary: Positive for dysuria (stinging) and vaginal discharge (minimal, foul odor).       Positive for foul smelling urine  Neurological:       Tingling in bilateral elbows to fingers and bilateral knees to ankles.     Allergies  Amoxicillin; Cephalosporins; Septra; Ciprocinonide; and Penicillins  Home Medications   Prior to Admission medications   Medication Sig Start Date End Date Taking? Authorizing Provider  ALPRAZolam Prudy Feeler(XANAX) 1 MG tablet Take 2 mg by mouth 4 (four) times daily. For anxiety    Historical Provider, MD  aspirin-sod bicarb-citric acid (ALKA-SELTZER) 325 MG TBEF Take 325 mg by mouth daily as needed (for pain).    Historical Provider, MD  HYDROcodone-acetaminophen (NORCO) 10-325 MG per tablet Take 1 tablet by mouth 4 (four) times daily.     Historical Provider, MD  ibuprofen (ADVIL,MOTRIN) 600 MG tablet Take 1 tablet (600 mg total) by mouth every 6 (six) hours as needed. 10/06/13   Burgess AmorJulie Idol, PA-C  Krill Oil CAPS Take 2 capsules by mouth every morning.  Historical Provider, MD  Multiple Vitamin (MULTIVITAMIN WITH MINERALS) TABS Take 2 tablets by mouth daily.     Historical Provider, MD  norethindrone-ethinyl estradiol (MICROGESTIN,JUNEL,LOESTRIN) 1-20 MG-MCG tablet Take 1 tablet by mouth daily. 02/18/14   Adline PotterJennifer A Griffin, NP  ranitidine (ZANTAC) 150 MG capsule Take 150 mg by mouth 2 (two) times daily.    Historical Provider, MD   BP 123/84  Pulse 91  Temp(Src) 98.2 F (36.8 C) (Oral)  Resp 24  Ht 5\' 1"  (1.549 m)  Wt 140 lb (63.504 kg)  BMI 26.47 kg/m2  SpO2 99%  LMP 04/12/2014  Physical Exam  Nursing note and vitals reviewed. Constitutional: She is oriented  to person, place, and time. She appears well-developed and well-nourished.  Non-toxic appearance. No distress.  HENT:  Head: Normocephalic and atraumatic.  Eyes: Conjunctivae, EOM and lids are normal. Pupils are equal, round, and reactive to light.  Neck: Normal range of motion. Neck supple. No mass present.  Cardiovascular: Normal rate.   Pulmonary/Chest: Effort normal. She has no decreased breath sounds. She has no rhonchi.  Abdominal: Soft. Normal appearance and bowel sounds are normal. She exhibits no distension. There is no tenderness. There is no rebound and no CVA tenderness.  Musculoskeletal: Normal range of motion.  Neurological: She is alert and oriented to person, place, and time. She has normal strength. No cranial nerve deficit or sensory deficit. She exhibits normal muscle tone. GCS eye subscore is 4. GCS verbal subscore is 5. GCS motor subscore is 6.  Sensation intact all extremities. Strength 5/5 in all extremities. Speech is normal. No facial asymmetry.   Skin: Skin is warm and dry. No abrasion and no rash noted.  Psychiatric: She has a normal mood and affect. Her speech is normal and behavior is normal.    ED Course  Procedures (including critical care time)  DIAGNOSTIC STUDIES: Oxygen Saturation is 99% on room air, normal by my interpretation.    COORDINATION OF CARE: 9:33 PM-Discussed treatment plan with pt at bedside and pt agreed to plan.   Labs Review Labs Reviewed  WET PREP, GENITAL - Abnormal; Notable for the following:    WBC, Wet Prep HPF POC MANY (*)    All other components within normal limits  BASIC METABOLIC PANEL - Abnormal; Notable for the following:    Potassium 3.5 (*)    Glucose, Bld 104 (*)    All other components within normal limits  GC/CHLAMYDIA PROBE AMP    Imaging Review No results found.   EKG Interpretation None     Medications - No data to display MDM   Final diagnoses:  None    Patient had a urinalysis done today at her  doctor's office and so this was not repeated. Neurological assessment shows no objective findings. Will give referral to neurology on call.   Toy BakerAnthony T Marvel Mcphillips, MD 04/18/14 [phone]

## 2014-04-18 NOTE — ED Notes (Addendum)
Tingling from  Elbows to fingers  Since Saturday.  Tingling both lower legs.  Seen by MD today , had normal urine and lab work  Was drawn.  Alert,

## 2014-04-20 LAB — GC/CHLAMYDIA PROBE AMP
CT Probe RNA: NEGATIVE
GC Probe RNA: NEGATIVE

## 2014-04-21 ENCOUNTER — Ambulatory Visit (INDEPENDENT_AMBULATORY_CARE_PROVIDER_SITE_OTHER): Payer: Medicaid Other | Admitting: Adult Health

## 2014-04-21 ENCOUNTER — Encounter: Payer: Self-pay | Admitting: Adult Health

## 2014-04-21 VITALS — BP 128/88 | HR 76 | Ht 61.0 in | Wt 140.0 lb

## 2014-04-21 DIAGNOSIS — Z0000 Encounter for general adult medical examination without abnormal findings: Secondary | ICD-10-CM

## 2014-04-21 DIAGNOSIS — Z01419 Encounter for gynecological examination (general) (routine) without abnormal findings: Secondary | ICD-10-CM

## 2014-04-21 DIAGNOSIS — Z309 Encounter for contraceptive management, unspecified: Secondary | ICD-10-CM

## 2014-04-21 MED ORDER — MEDROXYPROGESTERONE ACETATE 150 MG/ML IM SUSP: 150.0000 mg | INTRAMUSCULAR | Status: DC

## 2014-04-21 NOTE — Progress Notes (Signed)
Patient ID: Rebecca StablerShirley C [name], female   DOB: 27-Jul-1986, 28 y.o.   MRN: [id number] History of Present Illness:  [name] is a 28 year old white female,married in for a physical, she had a normal pap 04/2013.She did says she had tingling and swelling of hands and feet and was seen at PCP and ER this week.She stopped taking her OCs Monday and wants to get on depo provera.She had labs and said they were normal, she was treated for URI.  Current Medications, Allergies, Past Medical History, Past Surgical History, Family History and Social History were reviewed in [facility] electronic medical record.     Review of Systems: Patient denies any headaches, blurred vision, shortness of breath, chest pain, abdominal pain, problems with bowel movements, urination, or intercourse. No joint pain but had tingling earlier this week, no mood charges.    Physical Exam:BP 128/88  Pulse 76  Ht 5\' 1"  (1.549 m)  Wt 140 lb (63.504 kg)  BMI 26.47 kg/m2  LMP 04/12/2014 General:  Well developed, well nourished, no acute distress Skin:  Warm and dry Neck:  Midline trachea, normal thyroid Lungs; Clear to auscultation bilaterally Breast:  No dominant palpable mass, retraction, or nipple discharge Cardiovascular: Regular rate and rhythm Abdomen:  Soft, non tender, no hepatosplenomegaly Pelvic:  External genitalia is normal in appearance.  The vagina is normal in appearance.   The cervix is bulbous and everted at the os..  Uterus is felt to be normal size, shape, and contour.  No                adnexal masses or tenderness noted. Extremities:  No swelling or varicosities noted Psych:  No mood changes, alert and cooperative,seems happy   Impression: Yearly gyn exam no pap Contraceptive management    Plan: Pap and physical in 1 year Rx depo provera 150 mg disp 1 vial, for IM injection every 12 weeks in office with 4 refills Call with menses for shot Use condoms Review handout on depo

## 2014-04-21 NOTE — Patient Instructions (Signed)

## 2014-04-22 ENCOUNTER — Ambulatory Visit (INDEPENDENT_AMBULATORY_CARE_PROVIDER_SITE_OTHER): Payer: Medicaid Other | Admitting: Obstetrics & Gynecology

## 2014-04-22 ENCOUNTER — Encounter: Payer: Self-pay | Admitting: Obstetrics & Gynecology

## 2014-04-22 VITALS — BP 120/78 | Ht 61.0 in | Wt 138.5 lb

## 2014-04-22 DIAGNOSIS — Z309 Encounter for contraceptive management, unspecified: Secondary | ICD-10-CM

## 2014-04-22 DIAGNOSIS — Z3202 Encounter for pregnancy test, result negative: Secondary | ICD-10-CM

## 2014-04-22 DIAGNOSIS — Z3049 Encounter for surveillance of other contraceptives: Secondary | ICD-10-CM

## 2014-04-22 LAB — POCT URINE PREGNANCY: PREG TEST UR: NEGATIVE

## 2014-04-22 MED ORDER — MEDROXYPROGESTERONE ACETATE 150 MG/ML IM SUSP
150.0000 mg | Freq: Once | INTRAMUSCULAR | Status: AC
  Administered 2014-04-22: 150 mg via INTRAMUSCULAR

## 2014-04-22 NOTE — Progress Notes (Signed)
Pt here for Depo shot. No complaints at this time. To return in 12 weeks for next shot. JSY 

## 2014-07-15 ENCOUNTER — Encounter: Payer: Self-pay | Admitting: Adult Health

## 2014-07-15 ENCOUNTER — Ambulatory Visit (INDEPENDENT_AMBULATORY_CARE_PROVIDER_SITE_OTHER): Payer: Medicaid Other | Admitting: Adult Health

## 2014-07-15 DIAGNOSIS — Z3049 Encounter for surveillance of other contraceptives: Secondary | ICD-10-CM

## 2014-07-15 DIAGNOSIS — Z3042 Encounter for surveillance of injectable contraceptive: Secondary | ICD-10-CM

## 2014-07-15 DIAGNOSIS — Z3202 Encounter for pregnancy test, result negative: Secondary | ICD-10-CM

## 2014-07-15 LAB — POCT URINE PREGNANCY: Preg Test, Ur: NEGATIVE

## 2014-07-15 MED ORDER — MEDROXYPROGESTERONE ACETATE 150 MG/ML IM SUSP
150.0000 mg | Freq: Once | INTRAMUSCULAR | Status: AC
  Administered 2014-07-15: 150 mg via INTRAMUSCULAR

## 2014-10-03 ENCOUNTER — Encounter: Payer: Self-pay | Admitting: Adult Health

## 2014-10-07 ENCOUNTER — Ambulatory Visit (INDEPENDENT_AMBULATORY_CARE_PROVIDER_SITE_OTHER): Payer: Medicaid Other | Admitting: Adult Health

## 2014-10-07 ENCOUNTER — Encounter: Payer: Self-pay | Admitting: Adult Health

## 2014-10-07 DIAGNOSIS — Z3042 Encounter for surveillance of injectable contraceptive: Secondary | ICD-10-CM

## 2014-10-07 DIAGNOSIS — Z3202 Encounter for pregnancy test, result negative: Secondary | ICD-10-CM

## 2014-10-07 LAB — POCT URINE PREGNANCY: Preg Test, Ur: NEGATIVE

## 2014-10-07 MED ORDER — MEDROXYPROGESTERONE ACETATE 150 MG/ML IM SUSP
150.0000 mg | Freq: Once | INTRAMUSCULAR | Status: AC
  Administered 2014-10-07: 150 mg via INTRAMUSCULAR

## 2014-11-04 ENCOUNTER — Encounter: Payer: Self-pay | Admitting: Adult Health

## 2014-11-04 ENCOUNTER — Ambulatory Visit (INDEPENDENT_AMBULATORY_CARE_PROVIDER_SITE_OTHER): Payer: Medicaid Other | Admitting: Adult Health

## 2014-11-04 VITALS — BP 140/78 | Ht 61.0 in | Wt 136.5 lb

## 2014-11-04 DIAGNOSIS — R1032 Left lower quadrant pain: Secondary | ICD-10-CM

## 2014-11-04 DIAGNOSIS — R350 Frequency of micturition: Secondary | ICD-10-CM | POA: Insufficient documentation

## 2014-11-04 HISTORY — DX: Left lower quadrant pain: R10.32

## 2014-11-04 HISTORY — DX: Frequency of micturition: R35.0

## 2014-11-04 LAB — POCT URINALYSIS DIPSTICK
Blood, UA: NEGATIVE
GLUCOSE UA: NEGATIVE
LEUKOCYTES UA: NEGATIVE
NITRITE UA: NEGATIVE
PROTEIN UA: NEGATIVE

## 2014-11-04 NOTE — Progress Notes (Signed)
Subjective:     Patient ID: Rebecca StablerShirley C [name], female   DOB: 02-Oct-1986, 28 y.o.   MRN: [id number]  HPI [name] is a 28 year old white female in for urinary frequency and it tingles when she pees, is taking doxycycline from PCP for same.Has pain LLQ that radiates down front of left leg and has discomfort with sex at times.  Review of Systems See HPI Reviewed past medical,surgical, social and family history. Reviewed medications and allergies.     Objective:   Physical Exam BP 140/78 mmHg  Ht 5\' 1"  (1.549 m)  Wt 136 lb 8 oz (61.916 kg)  BMI 25.80 kg/m2 Urine dipstick negative,   Skin warm and dry.Pelvic: external genitalia is normal in appearance, vagina: scant discharge without odor, cervix:everted at os and bulbous, uterus: normal size, shape and contour, non tender, no masses felt, adnexa: no masses, mild tenderness noted LLQ.   Assessment:     LLQ pain  Urinary frequency     Plan:     Return next week for gyn US  And see me UA C&S sent Continue doxycycline Review handout on pelvic pain

## 2014-11-04 NOTE — Patient Instructions (Signed)
Pelvic Pain Female pelvic pain can be caused by many different things and start from a variety of places. Pelvic pain refers to pain that is located in the lower half of the abdomen and between your hips. The pain may occur over a short period of time (acute) or may be reoccurring (chronic). The cause of pelvic pain may be related to disorders affecting the female reproductive organs (gynecologic), but it may also be related to the bladder, kidney stones, an intestinal complication, or muscle or skeletal problems. Getting help right away for pelvic pain is important, especially if there has been severe, sharp, or a sudden onset of unusual pain. It is also important to get help right away because some types of pelvic pain can be life threatening.  CAUSES  Below are only some of the causes of pelvic pain. The causes of pelvic pain can be in one of several categories.   Gynecologic.  Pelvic inflammatory disease.  Sexually transmitted infection.  Ovarian cyst or a twisted ovarian ligament (ovarian torsion).  Uterine lining that grows outside the uterus (endometriosis).  Fibroids, cysts, or tumors.  Ovulation.  Pregnancy.  Pregnancy that occurs outside the uterus (ectopic pregnancy).  Miscarriage.  Labor.  Abruption of the placenta or ruptured uterus.  Infection.  Uterine infection (endometritis).  Bladder infection.  Diverticulitis.  Miscarriage related to a uterine infection (septic abortion).  Bladder.  Inflammation of the bladder (cystitis).  Kidney stone(s).  Gastrointestinal.  Constipation.  Diverticulitis.  Neurologic.  Trauma.  Feeling pelvic pain because of mental or emotional causes (psychosomatic).  Cancers of the bowel or pelvis. EVALUATION  Your caregiver will want to take a careful history of your concerns. This includes recent changes in your health, a careful gynecologic history of your periods (menses), and a sexual history. Obtaining your family  history and medical history is also important. Your caregiver may suggest a pelvic exam. A pelvic exam will help identify the location and severity of the pain. It also helps in the evaluation of which organ system may be involved. In order to identify the cause of the pelvic pain and be properly treated, your caregiver may order tests. These tests may include:   A pregnancy test.  Pelvic ultrasonography.  An X-ray exam of the abdomen.  A urinalysis or evaluation of vaginal discharge.  Blood tests. HOME CARE INSTRUCTIONS   Only take over-the-counter or prescription medicines for pain, discomfort, or fever as directed by your caregiver.   Rest as directed by your caregiver.   Eat a balanced diet.   Drink enough fluids to make your urine clear or pale yellow, or as directed.   Avoid sexual intercourse if it causes pain.   Apply warm or cold compresses to the lower abdomen depending on which one helps the pain.   Avoid stressful situations.   Keep a journal of your pelvic pain. Write down when it started, where the pain is located, and if there are things that seem to be associated with the pain, such as food or your menstrual cycle.  Follow up with your caregiver as directed.  SEEK MEDICAL CARE IF:  Your medicine does not help your pain.  You have abnormal vaginal discharge. SEEK IMMEDIATE MEDICAL CARE IF:   You have heavy bleeding from the vagina.   Your pelvic pain increases.   You feel light-headed or faint.   You have chills.   You have pain with urination or blood in your urine.   You have uncontrolled diarrhea   or vomiting.   You have a fever or persistent symptoms for more than 3 days.  You have a fever and your symptoms suddenly get worse.   You are being physically or sexually abused.  MAKE SURE YOU:  Understand these instructions.  Will watch your condition.  Will get help if you are not doing well or get worse. Document Released:  10/15/2004 Document Revised: 04/04/2014 Document Reviewed: 03/09/2012 [facility] Patient Information 2015 [employer] [place]. This information is not intended to replace advice given to you by your health care provider. Make sure you discuss any questions you have with your health care provider. Return next week for [place]

## 2014-11-05 LAB — URINALYSIS
Bilirubin Urine: NEGATIVE
Glucose, UA: NEGATIVE mg/dL
Hgb urine dipstick: NEGATIVE
KETONES UR: NEGATIVE mg/dL
LEUKOCYTES UA: NEGATIVE
NITRITE: NEGATIVE
PH: 6.5 (ref 5.0–8.0)
Protein, ur: NEGATIVE mg/dL
SPECIFIC GRAVITY, URINE: 1.006 (ref 1.005–1.030)
UROBILINOGEN UA: 0.2 mg/dL (ref 0.0–1.0)

## 2014-11-06 LAB — URINE CULTURE
COLONY COUNT: NO GROWTH
Organism ID, Bacteria: NO GROWTH

## 2014-11-08 ENCOUNTER — Encounter: Payer: Self-pay | Admitting: Adult Health

## 2014-11-08 ENCOUNTER — Ambulatory Visit (INDEPENDENT_AMBULATORY_CARE_PROVIDER_SITE_OTHER): Payer: Medicaid Other

## 2014-11-08 ENCOUNTER — Ambulatory Visit (INDEPENDENT_AMBULATORY_CARE_PROVIDER_SITE_OTHER): Payer: Medicaid Other | Admitting: Adult Health

## 2014-11-08 VITALS — BP 118/86 | Ht 61.0 in | Wt 138.5 lb

## 2014-11-08 DIAGNOSIS — R1032 Left lower quadrant pain: Secondary | ICD-10-CM

## 2014-11-08 NOTE — Patient Instructions (Signed)
Follow up prn

## 2014-11-08 NOTE — Progress Notes (Signed)
Subjective:     Patient ID: Rebecca [name], female   DOB: 17-Aug-1986, 28 y.o.   MRN: [id number]  HPI Rebecca ForestShirley is a 28 year old white female in for US for LLQ pain.  Review of Systems See HPI Reviewed past medical,surgical, social and family history. Reviewed medications and allergies.     Objective:   Physical Exam BP 118/86 mmHg  Ht 5\' 1"  (1.549 m)  Wt 138 lb 8 oz (62.823 kg)  BMI 26.18 kg/m2Reviewed US with pt.   Uterus 6.0 x 3.6 x 2.8 cm, anteverted   Endometrium 3.6 mm, symmetrical,   Right ovary 2.5 x 2.4 x 1.9 cm,   Left ovary 2.4 x 2.3 x 2.0 cm,   No free fluid or adnexal masses noted within the pelvis  Technician Comments:  Anteverted uterus, Endometrium=3.36mm symmetrical, bilateral adnexa/ovaries appear WNL, no free fluid or adnexal masses noted within the pelvis Started with diarrhea today and cramps.She also has white spots on arms,and chest, see PCP.  Assessment:     LLQ pain probably GI related    Plan:    Try imodium prn Follow up prn See PCP

## 2014-12-26 ENCOUNTER — Other Ambulatory Visit (HOSPITAL_COMMUNITY): Payer: Self-pay | Admitting: Internal Medicine

## 2014-12-26 ENCOUNTER — Ambulatory Visit (HOSPITAL_COMMUNITY)
Admission: RE | Admit: 2014-12-26 | Discharge: 2014-12-26 | Disposition: A | Discharge status: Home / Self Care | Payer: Medicaid Other | Source: Ambulatory Visit | Attending: Internal Medicine | Admitting: Internal Medicine

## 2014-12-26 DIAGNOSIS — R05 Cough: Secondary | ICD-10-CM | POA: Insufficient documentation

## 2014-12-26 DIAGNOSIS — R059 Cough, unspecified: Secondary | ICD-10-CM

## 2014-12-30 ENCOUNTER — Ambulatory Visit (INDEPENDENT_AMBULATORY_CARE_PROVIDER_SITE_OTHER): Payer: Medicaid Other | Admitting: *Deleted

## 2014-12-30 ENCOUNTER — Encounter: Payer: Self-pay | Admitting: *Deleted

## 2014-12-30 DIAGNOSIS — Z3202 Encounter for pregnancy test, result negative: Secondary | ICD-10-CM

## 2014-12-30 DIAGNOSIS — Z3042 Encounter for surveillance of injectable contraceptive: Secondary | ICD-10-CM

## 2014-12-30 LAB — POCT URINE PREGNANCY: PREG TEST UR: NEGATIVE

## 2014-12-30 MED ORDER — MEDROXYPROGESTERONE ACETATE 150 MG/ML IM SUSP
150.0000 mg | Freq: Once | INTRAMUSCULAR | Status: AC
  Administered 2014-12-30: 150 mg via INTRAMUSCULAR

## 2014-12-30 NOTE — Progress Notes (Signed)
Pt here for Depo shot. No complaints at this time. To return in 12 weeks for next shot. JSY 

## 2015-03-23 ENCOUNTER — Ambulatory Visit (HOSPITAL_COMMUNITY)
Admission: RE | Admit: 2015-03-23 | Discharge: 2015-03-23 | Disposition: A | Discharge status: Home / Self Care | Payer: Medicaid Other | Source: Ambulatory Visit | Attending: Internal Medicine | Admitting: Internal Medicine

## 2015-03-23 ENCOUNTER — Other Ambulatory Visit (HOSPITAL_COMMUNITY): Payer: Self-pay | Admitting: Internal Medicine

## 2015-03-23 DIAGNOSIS — M546 Pain in thoracic spine: Secondary | ICD-10-CM | POA: Insufficient documentation

## 2015-03-24 ENCOUNTER — Ambulatory Visit: Payer: Medicaid Other

## 2015-03-30 ENCOUNTER — Encounter: Payer: Self-pay | Admitting: Adult Health

## 2015-03-30 ENCOUNTER — Ambulatory Visit (INDEPENDENT_AMBULATORY_CARE_PROVIDER_SITE_OTHER): Payer: Medicaid Other | Admitting: Adult Health

## 2015-03-30 VITALS — BP 120/80 | HR 84 | Ht 61.0 in | Wt 134.0 lb

## 2015-03-30 DIAGNOSIS — R350 Frequency of micturition: Secondary | ICD-10-CM

## 2015-03-30 DIAGNOSIS — Z3202 Encounter for pregnancy test, result negative: Secondary | ICD-10-CM

## 2015-03-30 DIAGNOSIS — Z30011 Encounter for initial prescription of contraceptive pills: Secondary | ICD-10-CM

## 2015-03-30 DIAGNOSIS — N3281 Overactive bladder: Secondary | ICD-10-CM

## 2015-03-30 LAB — POCT URINE PREGNANCY: PREG TEST UR: NEGATIVE

## 2015-03-30 LAB — POCT URINALYSIS DIPSTICK
Glucose, UA: NEGATIVE
Leukocytes, UA: NEGATIVE
NITRITE UA: NEGATIVE
Protein, UA: NEGATIVE
RBC UA: NEGATIVE

## 2015-03-30 MED ORDER — NORETHIN ACE-ETH ESTRAD-FE 1-20 MG-MCG(24) PO CHEW: 1.0000 | CHEWABLE_TABLET | Freq: Every day | ORAL | Status: DC

## 2015-03-30 MED ORDER — SOLIFENACIN SUCCINATE 10 MG PO TABS: 10.0000 mg | ORAL_TABLET | Freq: Every day | ORAL | Status: DC

## 2015-03-30 NOTE — Progress Notes (Signed)
Subjective:     Patient ID: Rebecca StablerShirley C [name], female   DOB: [birth year], 29 y.o.   MRN: [id number]  HPI Rebecca ForestShirley is a 29 year old white female, separated in wanting to get on OCs, has been on depo and does not have a period for over a year, and is having some bloating, she says "looks 5 months pregnant".She also complains of urinary frequency.Depo was due last week and she did not get.  Review of Systems Patient denies any headaches, hearing loss, fatigue, blurred vision, shortness of breath, chest pain, abdominal pain, problems with bowel movements, or intercourse(not having sex at present). No joint pain or mood swings.See HPI for positives. Reviewed past medical,surgical, social and family history. Reviewed medications and allergies.     Objective:   Physical Exam BP 120/80 mmHg  Pulse 84  Ht 5\' 1"  (1.549 m)  Wt 134 lb (60.782 kg)  BMI 25.33 kg/m2UPT negative, urine dipstick negative, Skin warm and dry. Neck: mid line trachea, normal thyroid, good ROM, no lymphadenopathy noted. Lungs: clear to ausculation bilaterally. Cardiovascular: regular rate and rhythm.abdomen soft, non tender, no HSM, no bladder tenderness    Assessment:     Contraceptive management OAB Urinary frequency     Plan:     Rx vesicare 10 mg 1 daily #30 with 6 refills Rx minastrin disp 1 pack take 1 daily with 11 refills, 1 pack given to start today lot [id number] a exp 7/16 Follow up prn Use condoms

## 2015-03-30 NOTE — Patient Instructions (Signed)
Start minastrin Take vesicare 10 mg 1 daily Follow up prn

## 2015-04-06 ENCOUNTER — Other Ambulatory Visit (HOSPITAL_COMMUNITY): Payer: Self-pay | Admitting: Internal Medicine

## 2015-04-06 DIAGNOSIS — M546 Pain in thoracic spine: Secondary | ICD-10-CM

## 2015-04-06 DIAGNOSIS — G894 Chronic pain syndrome: Secondary | ICD-10-CM

## 2015-04-13 ENCOUNTER — Other Ambulatory Visit (HOSPITAL_COMMUNITY): Payer: Medicaid Other

## 2015-04-18 ENCOUNTER — Ambulatory Visit (HOSPITAL_COMMUNITY): Payer: Medicaid Other | Attending: Internal Medicine

## 2015-04-24 ENCOUNTER — Other Ambulatory Visit: Payer: Medicaid Other | Admitting: Adult Health

## 2015-04-26 ENCOUNTER — Other Ambulatory Visit: Payer: Medicaid Other | Admitting: Adult Health

## 2015-04-26 ENCOUNTER — Telehealth: Payer: Self-pay | Admitting: *Deleted

## 2015-04-26 NOTE — Telephone Encounter (Signed)
Spoke with pt. Pt was put on different birth control in [place], [place]. She has one more sugar pill left of Minastrin. The pharmacist said her insurance don't pay for Minastrin, so she was given Microgestin 1/20 Fe. She needs to wait until Sunday to start new pack and use backup, right? Thanks!! JSY

## 2015-04-26 NOTE — Telephone Encounter (Signed)
Will start microgestin Sunday and use condoms x 1 pack

## 2015-05-04 ENCOUNTER — Other Ambulatory Visit: Payer: Medicaid Other | Admitting: Adult Health

## 2015-05-11 ENCOUNTER — Encounter: Payer: Self-pay | Admitting: Adult Health

## 2015-05-11 ENCOUNTER — Other Ambulatory Visit (HOSPITAL_COMMUNITY)
Admission: RE | Admit: 2015-05-11 | Discharge: 2015-05-11 | Disposition: A | Discharge status: Home / Self Care | Payer: Medicaid Other | Source: Ambulatory Visit | Attending: Adult Health | Admitting: Adult Health

## 2015-05-11 ENCOUNTER — Ambulatory Visit (INDEPENDENT_AMBULATORY_CARE_PROVIDER_SITE_OTHER): Payer: Medicaid Other | Admitting: Adult Health

## 2015-05-11 VITALS — BP 120/70 | HR 72 | Ht 60.25 in | Wt 136.5 lb

## 2015-05-11 DIAGNOSIS — Z01419 Encounter for gynecological examination (general) (routine) without abnormal findings: Secondary | ICD-10-CM

## 2015-05-11 DIAGNOSIS — N644 Mastodynia: Secondary | ICD-10-CM | POA: Insufficient documentation

## 2015-05-11 DIAGNOSIS — Z1151 Encounter for screening for human papillomavirus (HPV): Secondary | ICD-10-CM | POA: Diagnosis present

## 2015-05-11 DIAGNOSIS — Z113 Encounter for screening for infections with a predominantly sexual mode of transmission: Secondary | ICD-10-CM | POA: Insufficient documentation

## 2015-05-11 DIAGNOSIS — Z0000 Encounter for general adult medical examination without abnormal findings: Secondary | ICD-10-CM

## 2015-05-11 DIAGNOSIS — Z3041 Encounter for surveillance of contraceptive pills: Secondary | ICD-10-CM

## 2015-05-11 HISTORY — DX: Mastodynia: N64.4

## 2015-05-11 NOTE — Progress Notes (Signed)
Patient ID: Rebecca [name], female   DOB: [birth year], 29 y.o.   MRN: [id number] History of Present Illness: Rebecca [name] is a 29 year old white female, married in for well woman gyn exam and pap.She complains of pain in left breast for about a month, no discharge from nipple or masses felt.   Current Medications, Allergies, Past Medical History, Past Surgical History, Family History and Social History were reviewed in [name].     Review of Systems: Patient denies any headaches, hearing loss, fatigue, blurred vision, shortness of breath, chest pain, abdominal pain, problems with bowel movements, urination, or intercourse. No joint pain or mood swings.Has pain left breast. Happy with OCs.   Physical Exam:BP 120/70 mmHg  Pulse 72  Ht 5' 0.25" (1.53 m)  Wt 136 lb 8 oz (61.916 kg)  BMI 26.45 kg/m2  LMP 04/26/2015 General:  Well developed, well nourished, no acute distress Skin:  Warm and dry Neck:  Midline trachea, normal thyroid, good ROM, no lymphadenopathy Lungs; Clear to auscultation bilaterally Breast:  No dominant palpable mass, retraction, or nipple discharge Cardiovascular: Regular rate and rhythm Abdomen:  Soft, non tender, no hepatosplenomegaly Pelvic:  External genitalia is normal in appearance, no lesions.  The vagina is normal in appearance. Urethra has no lesions or masses. The cervix is bulbous, pap with HPV and GC/CHL performed.  Uterus is felt to be normal size, shape, and contour.  No adnexal masses or tenderness noted.Bladder is non tender, no masses felt. Extremities/musculoskeletal:  No swelling or varicosities noted, no clubbing or cyanosis Psych:  No mood changes, alert and cooperative,seems happy   Impression: Well woman gyn exam with pap Contraceptive management Breast pain    Plan: Continue Junel 1-20, has refills Decrease caffeine Pap in 1 year Check CBC,CMP,TSH and lipids

## 2015-05-11 NOTE — Patient Instructions (Addendum)
Physical in 1 year Continue OCs Decrease caffeine

## 2015-05-12 LAB — COMPREHENSIVE METABOLIC PANEL
ALT: 10 IU/L (ref 0–32)
AST: 13 IU/L (ref 0–40)
Albumin/Globulin Ratio: 1.6 (ref 1.1–2.5)
Albumin: 4 g/dL (ref 3.5–5.5)
Alkaline Phosphatase: 86 IU/L (ref 39–117)
BUN / CREAT RATIO: 7 — AB (ref 8–20)
BUN: 6 mg/dL (ref 6–20)
CO2: 19 mmol/L (ref 18–29)
Calcium: 9.3 mg/dL (ref 8.7–10.2)
Chloride: 103 mmol/L (ref 97–108)
Creatinine, Ser: 0.81 mg/dL (ref 0.57–1.00)
GFR calc Af Amer: 114 mL/min/{1.73_m2} (ref >59)
GFR, EST NON AFRICAN AMERICAN: 98 mL/min/{1.73_m2} (ref >59)
Globulin, Total: 2.5 g/dL (ref 1.5–4.5)
Glucose: 71 mg/dL (ref 65–99)
Potassium: 4.4 mmol/L (ref 3.5–5.2)
SODIUM: 144 mmol/L (ref 134–144)
Total Protein: 6.5 g/dL (ref 6.0–8.5)

## 2015-05-12 LAB — LIPID PANEL
CHOLESTEROL TOTAL: 218 mg/dL — AB (ref 100–199)
Chol/HDL Ratio: 5.5 ratio units — ABNORMAL HIGH (ref 0.0–4.4)
HDL: 40 mg/dL (ref >39)
LDL Calculated: 139 mg/dL — ABNORMAL HIGH (ref 0–99)
Triglycerides: 194 mg/dL — ABNORMAL HIGH (ref 0–149)
VLDL Cholesterol Cal: 39 mg/dL (ref 5–40)

## 2015-05-12 LAB — CBC
Hematocrit: 40.7 % (ref 34.0–46.6)
Hemoglobin: 14.1 g/dL (ref 11.1–15.9)
MCH: 31.1 pg (ref 26.6–33.0)
MCHC: 34.6 g/dL (ref 31.5–35.7)
MCV: 90 fL (ref 79–97)
PLATELETS: 205 10*3/uL (ref 150–379)
RBC: 4.53 x10E6/uL (ref 3.77–5.28)
RDW: 13.3 % (ref 12.3–15.4)
WBC: 6.9 10*3/uL (ref 3.4–10.8)

## 2015-05-12 LAB — TSH: TSH: 1.23 u[IU]/mL (ref 0.450–4.500)

## 2015-05-12 LAB — CYTOLOGY - PAP

## 2015-05-16 ENCOUNTER — Telehealth: Payer: Self-pay | Admitting: Adult Health

## 2015-05-16 NOTE — Telephone Encounter (Signed)
Left message to call about labs 

## 2015-05-16 NOTE — Telephone Encounter (Signed)
Pt aware of labs and pap and need to change diet and increase exercise to get lipids in control, vs taking meds , and recheck labs in November

## 2015-06-09 ENCOUNTER — Telehealth: Payer: Self-pay | Admitting: Adult Health

## 2015-06-09 MED ORDER — SIMVASTATIN 20 MG PO TABS: 20.0000 mg | ORAL_TABLET | Freq: Every day | ORAL | Status: DC

## 2015-06-09 NOTE — Telephone Encounter (Signed)
Spoke with pt. Pt wants you to order cholesterol med. Thanks!! JSY

## 2015-06-09 NOTE — Telephone Encounter (Signed)
Will rx zocor

## 2015-09-28 ENCOUNTER — Ambulatory Visit (INDEPENDENT_AMBULATORY_CARE_PROVIDER_SITE_OTHER): Payer: Medicaid Other | Admitting: Otolaryngology

## 2015-09-28 ENCOUNTER — Other Ambulatory Visit: Payer: Medicaid Other

## 2015-09-28 ENCOUNTER — Other Ambulatory Visit (INDEPENDENT_AMBULATORY_CARE_PROVIDER_SITE_OTHER): Payer: Self-pay | Admitting: Otolaryngology

## 2015-09-28 DIAGNOSIS — J320 Chronic maxillary sinusitis: Secondary | ICD-10-CM

## 2015-09-28 DIAGNOSIS — E782 Mixed hyperlipidemia: Secondary | ICD-10-CM

## 2015-09-28 DIAGNOSIS — J343 Hypertrophy of nasal turbinates: Secondary | ICD-10-CM

## 2015-09-28 DIAGNOSIS — J329 Chronic sinusitis, unspecified: Secondary | ICD-10-CM

## 2015-09-29 ENCOUNTER — Telehealth: Payer: Self-pay | Admitting: Adult Health

## 2015-09-29 LAB — COMPREHENSIVE METABOLIC PANEL
ALK PHOS: 96 IU/L (ref 39–117)
ALT: 13 IU/L (ref 0–32)
AST: 17 IU/L (ref 0–40)
Albumin/Globulin Ratio: 1.7 (ref 1.1–2.5)
Albumin: 4 g/dL (ref 3.5–5.5)
BUN / CREAT RATIO: 6 — AB (ref 8–20)
BUN: 4 mg/dL — ABNORMAL LOW (ref 6–20)
Bilirubin Total: <0.2 mg/dL (ref 0.0–1.2)
CO2: 23 mmol/L (ref 18–29)
CREATININE: 0.71 mg/dL (ref 0.57–1.00)
Calcium: 9 mg/dL (ref 8.7–10.2)
Chloride: 100 mmol/L (ref 97–106)
GFR, EST AFRICAN AMERICAN: 133 mL/min/{1.73_m2} (ref >59)
GFR, EST NON AFRICAN AMERICAN: 115 mL/min/{1.73_m2} (ref >59)
GLOBULIN, TOTAL: 2.4 g/dL (ref 1.5–4.5)
Glucose: 115 mg/dL — ABNORMAL HIGH (ref 65–99)
Potassium: 4.1 mmol/L (ref 3.5–5.2)
SODIUM: 139 mmol/L (ref 136–144)
TOTAL PROTEIN: 6.4 g/dL (ref 6.0–8.5)

## 2015-09-29 LAB — LIPID PANEL
Chol/HDL Ratio: 4.2 ratio units (ref 0.0–4.4)
Cholesterol, Total: 161 mg/dL (ref 100–199)
HDL: 38 mg/dL — ABNORMAL LOW (ref >39)
LDL Calculated: 90 mg/dL (ref 0–99)
Triglycerides: 163 mg/dL — ABNORMAL HIGH (ref 0–149)
VLDL CHOLESTEROL CAL: 33 mg/dL (ref 5–40)

## 2015-09-29 NOTE — Telephone Encounter (Signed)
Left message to call about labs 

## 2015-10-02 ENCOUNTER — Ambulatory Visit (HOSPITAL_COMMUNITY)
Admission: RE | Admit: 2015-10-02 | Discharge: 2015-10-02 | Disposition: A | Discharge status: Home / Self Care | Payer: Medicaid Other | Source: Ambulatory Visit | Attending: Otolaryngology | Admitting: Otolaryngology

## 2015-10-02 ENCOUNTER — Telehealth: Payer: Self-pay | Admitting: *Deleted

## 2015-10-02 DIAGNOSIS — J329 Chronic sinusitis, unspecified: Secondary | ICD-10-CM | POA: Diagnosis present

## 2015-10-02 DIAGNOSIS — R51 Headache: Secondary | ICD-10-CM | POA: Diagnosis not present

## 2015-10-02 MED ORDER — SIMVASTATIN 20 MG PO TABS: 20.0000 mg | ORAL_TABLET | Freq: Every day | ORAL | Status: DC

## 2015-10-02 NOTE — Telephone Encounter (Signed)
Pt aware of labs, will stay on zocor, some cramps in legs will recheck labs in 3 months,CMP LIPIDS and A1c

## 2015-10-19 ENCOUNTER — Ambulatory Visit (INDEPENDENT_AMBULATORY_CARE_PROVIDER_SITE_OTHER): Payer: Medicaid Other | Admitting: Otolaryngology

## 2015-10-19 DIAGNOSIS — J310 Chronic rhinitis: Secondary | ICD-10-CM

## 2015-10-19 DIAGNOSIS — J343 Hypertrophy of nasal turbinates: Secondary | ICD-10-CM

## 2015-10-19 DIAGNOSIS — J342 Deviated nasal septum: Secondary | ICD-10-CM | POA: Diagnosis not present

## 2015-10-20 ENCOUNTER — Other Ambulatory Visit: Payer: Self-pay | Admitting: Otolaryngology

## 2015-11-07 ENCOUNTER — Encounter (HOSPITAL_BASED_OUTPATIENT_CLINIC_OR_DEPARTMENT_OTHER): Admission: RE | Payer: Self-pay | Source: Ambulatory Visit

## 2015-11-07 ENCOUNTER — Ambulatory Visit (HOSPITAL_BASED_OUTPATIENT_CLINIC_OR_DEPARTMENT_OTHER): Admission: RE | Admit: 2015-11-07 | Payer: Medicaid Other | Source: Ambulatory Visit | Admitting: Otolaryngology

## 2015-11-07 SURGERY — SEPTOPLASTY, NOSE, WITH NASAL TURBINATE REDUCTION
Anesthesia: General | Laterality: Bilateral

## 2016-02-21 ENCOUNTER — Other Ambulatory Visit (HOSPITAL_COMMUNITY): Payer: Self-pay | Admitting: Internal Medicine

## 2016-02-21 ENCOUNTER — Ambulatory Visit (HOSPITAL_COMMUNITY)
Admission: RE | Admit: 2016-02-21 | Discharge: 2016-02-21 | Disposition: A | Discharge status: Home / Self Care | Payer: Medicaid Other | Source: Ambulatory Visit | Attending: Internal Medicine | Admitting: Internal Medicine

## 2016-02-21 DIAGNOSIS — R059 Cough, unspecified: Secondary | ICD-10-CM

## 2016-02-21 DIAGNOSIS — R05 Cough: Secondary | ICD-10-CM

## 2016-03-27 ENCOUNTER — Telehealth: Payer: Self-pay | Admitting: Adult Health

## 2016-03-27 ENCOUNTER — Other Ambulatory Visit: Payer: Self-pay | Admitting: Adult Health

## 2016-03-27 NOTE — Telephone Encounter (Signed)
Left message letting pt know birth control refill was sent over today. JSY

## 2016-05-14 ENCOUNTER — Encounter: Payer: Self-pay | Admitting: Adult Health

## 2016-05-14 ENCOUNTER — Ambulatory Visit (INDEPENDENT_AMBULATORY_CARE_PROVIDER_SITE_OTHER): Payer: Medicaid Other | Admitting: Adult Health

## 2016-05-14 VITALS — BP 120/72 | HR 70 | Ht 60.5 in | Wt 133.5 lb

## 2016-05-14 DIAGNOSIS — R350 Frequency of micturition: Secondary | ICD-10-CM

## 2016-05-14 DIAGNOSIS — F17200 Nicotine dependence, unspecified, uncomplicated: Secondary | ICD-10-CM

## 2016-05-14 DIAGNOSIS — Z0000 Encounter for general adult medical examination without abnormal findings: Secondary | ICD-10-CM | POA: Diagnosis not present

## 2016-05-14 DIAGNOSIS — L0292 Furuncle, unspecified: Secondary | ICD-10-CM | POA: Insufficient documentation

## 2016-05-14 DIAGNOSIS — Z3041 Encounter for surveillance of contraceptive pills: Secondary | ICD-10-CM

## 2016-05-14 DIAGNOSIS — Z01419 Encounter for gynecological examination (general) (routine) without abnormal findings: Secondary | ICD-10-CM | POA: Diagnosis not present

## 2016-05-14 HISTORY — DX: Furuncle, unspecified: L02.92

## 2016-05-14 HISTORY — DX: Nicotine dependence, unspecified, uncomplicated: F17.200

## 2016-05-14 LAB — POCT URINALYSIS DIPSTICK
Blood, UA: NEGATIVE
Glucose, UA: NEGATIVE
Leukocytes, UA: NEGATIVE
Nitrite, UA: NEGATIVE
Protein, UA: NEGATIVE

## 2016-05-14 MED ORDER — NORETHIN ACE-ETH ESTRAD-FE 1-20 MG-MCG PO TABS: 1.0000 | ORAL_TABLET | Freq: Every day | ORAL | Status: DC

## 2016-05-14 NOTE — Progress Notes (Signed)
Patient ID: Rebecca StablerShirley C [name], female   DOB: 22-Oct-1986, 30 y.o.   MRN: [id number] History of Present Illness: Rebecca [name] is a 30 year old white female in for a well woman gyn exam,she had a normal pap with negative HPV.She is happy with her OCs.She complains of urinary frequency at times and has sore spot on abdomen near bra line. PCP is Rebecca [name].  Current Medications, Allergies, Past Medical History, Past Surgical History, Family History and Social History were reviewed in [facility] electronic medical record.     Review of Systems: Patient denies any headaches, hearing loss, fatigue, blurred vision, shortness of breath, chest pain, abdominal pain, problems with bowel movements,  or intercourse(has occasional discomfort with different positions). No joint pain or mood swings.See HPI for positives.    Physical Exam:BP 120/72 mmHg  Pulse 70  Ht 5' 0.5" (1.537 m)  Wt 133 lb 8 oz (60.555 kg)  BMI 25.63 kg/m2 urine dipstick negative General:  Well developed, well nourished, no acute distress Skin:  Warm and dry Neck:  Midline trachea, normal thyroid, good ROM, no lymphadenopathy Lungs; Clear to auscultation bilaterally Breast:  No dominant palpable mass, retraction, or nipple discharge,has boil in bra line,opened with sterile needle and pus and blood was expelled and band aide applied Cardiovascular: Regular rate and rhythm Abdomen:  Soft, non tender, no hepatosplenomegaly Pelvic:  External genitalia is normal in appearance, no lesions.  The vagina is normal in appearance. Urethra has no lesions or masses. The cervix is bulbous.  Uterus is felt to be normal size, shape, and contour.  No adnexal masses or tenderness noted.Bladder is non tender, no masses felt. Extremities/musculoskeletal:  No swelling or varicosities noted, no clubbing or cyanosis Psych:  No mood changes, alert and cooperative,seems happy   Impression: Well woman gyn exam no pap Contraceptive management Boil Smoker   Urinary frequency     Plan: Refilled microgestin 1-20 x 1 year Physical in 1 year, pap 2019 Decrease smoking

## 2016-05-14 NOTE — Patient Instructions (Signed)
Physical in 1 year Decrease smoking

## 2016-08-02 ENCOUNTER — Telehealth: Payer: Self-pay | Admitting: Adult Health

## 2016-08-02 NOTE — Telephone Encounter (Signed)
Spoke with pt. Pt missed a pill and doubled up. She started bleeding and I advised that was normal when you miss a pill. I advised to try not to miss a pill and continue taking one a day. Pt voiced understanding. JSY

## 2016-09-14 ENCOUNTER — Other Ambulatory Visit: Payer: Self-pay | Admitting: Adult Health

## 2016-09-23 ENCOUNTER — Ambulatory Visit (INDEPENDENT_AMBULATORY_CARE_PROVIDER_SITE_OTHER): Payer: Medicaid Other | Admitting: Adult Health

## 2016-09-23 ENCOUNTER — Encounter: Payer: Self-pay | Admitting: Adult Health

## 2016-09-23 VITALS — BP 110/70 | HR 84 | Ht 61.0 in | Wt 136.0 lb

## 2016-09-23 DIAGNOSIS — F17210 Nicotine dependence, cigarettes, uncomplicated: Secondary | ICD-10-CM | POA: Diagnosis not present

## 2016-09-23 DIAGNOSIS — N9089 Other specified noninflammatory disorders of vulva and perineum: Secondary | ICD-10-CM | POA: Diagnosis not present

## 2016-09-23 DIAGNOSIS — Z319 Encounter for procreative management, unspecified: Secondary | ICD-10-CM

## 2016-09-23 NOTE — Patient Instructions (Addendum)
Make appt with Dr Sherwood Gambler to wean off xanax and norco  Call when off meds Try to decrease cigarettes  Preparing for Pregnancy Before trying to become pregnant, make an appointment with your health care provider (preconception care). The goal is to help you have a healthy, safe pregnancy. At your first appointment, your health care provider will:   Do a complete physical exam, including a Pap test.  Take a complete medical history.  Give you advice and help you resolve any problems. PRECONCEPTION CHECKLIST Here is a list of the basics to cover with your health care provider at your preconception visit:  Medical history.  Tell your health care provider about any diseases you have had. Many diseases can affect your pregnancy.  Include your partner's medical history and family history.  Make sure you have been tested for sexually transmitted infections (STIs). These can affect your pregnancy. In some cases, they can be passed to your baby. Tell your health care provider about any history of STIs.  Make sure your health care provider knows about any previous problems you have had with conception or pregnancy.  Tell your health care provider about any medicine you take. This includes herbal supplements and over-the-counter medicines.  Make sure all your immunizations are up to date. You may need to make additional appointments.  Ask your health care provider if you need any vaccinations or if there are any you should avoid.  Diet.  It is especially important to eat a healthy, balanced diet with the right nutrients when you are pregnant.  Ask your health care provider to help you get to a healthy weight before pregnancy.  If you are overweight, you are at higher risk for certain complications. These include high blood pressure, diabetes, and preterm birth.  If you are underweight, you are more likely to have a low-birth-weight baby.  Lifestyle.  Tell your health care provider about  lifestyle factors such as alcohol use, drug use, or smoking.  Describe any harmful substances you may be exposed to at work or home. These can include chemicals, pesticides, and radiation.  Mental health.  Let your health care provider know if you have been feeling depressed or anxious.  Let your health care provider know if you have a history of substance abuse.  Let your health care provider know if you do not feel safe at home. HOME INSTRUCTIONS TO PREPARE FOR PREGNANCY Follow your health care provider's advice and instructions.   Keep an accurate record of your menstrual periods. This makes it easier for your health care provider to determine your baby's due date.  Begin taking prenatal vitamins and folic acid supplements daily. Take them as directed by your health care provider.  Eat a balanced diet. Get help from a nutrition counselor if you have questions or need help.  Get regular exercise. Try to be active for at least 30 minutes a day most days of the week.  Quit smoking, if you smoke.  Do not drink alcohol.  Do not take illegal drugs.  Get medical problems, such as diabetes or high blood pressure, under control.  If you have diabetes, make sure you do the following:  Have good blood sugar control. If you have type 1 diabetes, use multiple daily doses of insulin. Do not use split-dose or premixed insulin.  Have an eye exam by a qualified eye care professional trained in caring for people with diabetes.  Get evaluated by your health care provider for cardiovascular disease.  Get to a healthy weight. If you are overweight or obese, reduce your weight with the help of a qualified health professional such as a Museum/gallery exhibitions officerregistered dietitian. Ask your health care provider what the right weight range is for you. HOW DO I KNOW I AM PREGNANT? You may be pregnant if you have been sexually active and you miss your period. Symptoms of early pregnancy include:   Mild cramping.  Very  light vaginal bleeding (spotting).  Feeling unusually tired.  Morning sickness. If you have any of these symptoms, take a home pregnancy test. These tests look for a hormone called human chorionic gonadotropin (hCG) in your urine. Your body begins to make this hormone during early pregnancy. These tests are very accurate. Wait until at least the first day you miss your period to take one. If you get a positive result, call your health care provider to make appointments for prenatal care. WHAT SHOULD I DO IF I BECOME PREGNANT?  Make an appointment with your health care provider by week 12 of your pregnancy at the latest.  Do not smoke. Smoking can be harmful to your baby.  Do not drink alcoholic beverages. Alcohol is related to a number of birth defects.  Avoid toxic odors and chemicals.  You may continue to have sexual intercourse if it does not cause pain or other problems, such as vaginal bleeding.   This information is not intended to replace advice given to you by your health care provider. Make sure you discuss any questions you have with your health care provider.   Document Released: 10/31/2008 Document Revised: 12/09/2014 Document Reviewed: 10/25/2013 Elsevier Interactive Patient Education 2016 Elsevier Inc.  Stay on OC s for now Call when off xanax

## 2016-09-23 NOTE — Progress Notes (Signed)
Subjective:     Patient ID: Rebecca [name], female   DOB: 02/10/1986, 30 y.o.   MRN: [id number]  HPI Rebecca [name] is a 30 year old white female in complaining of vulva irritation at times and skin looks and feels different.And she wants to get pregnant in future.   Review of Systems Vulva irritation Desires pregnancy  Reviewed past medical,surgical, social and family history. Reviewed medications and allergies.     Objective:   Physical Exam BP 110/70 (BP Location: Left Arm, Patient Position: Sitting, Cuff Size: Normal)   Pulse 84   Ht 5\' 1"  (1.549 m)   Wt 136 lb (61.7 kg)   LMP 09/19/2016 (Exact Date)   BMI 25.70 kg/m Skin warm and dry, has some normal variations in skin texture and color of left labia. PHQ 2 score 0. Discussed that will need to wean off xanax and norco and stop zocor before getting pregnant.Also stop or decreasing smoking.Make appt with Dr Sherwood GamblerFusco.    Assessment:     1. Irritation of vulva   2. Patient desires pregnancy       Plan:     Make appt with Dr Sherwood GamblerFusco to wean off xanax and norco Decrease cigarettes Call when off meds ,will stop ocs and zocor then and rx prenatal vitamins Review handout on preparing for pregnancy

## 2016-12-24 ENCOUNTER — Encounter: Payer: Self-pay | Admitting: Obstetrics & Gynecology

## 2016-12-24 ENCOUNTER — Ambulatory Visit (INDEPENDENT_AMBULATORY_CARE_PROVIDER_SITE_OTHER): Payer: Medicaid Other | Admitting: Obstetrics & Gynecology

## 2016-12-24 VITALS — BP 110/80 | HR 84 | Wt 146.0 lb

## 2016-12-24 DIAGNOSIS — N72 Inflammatory disease of cervix uteri: Secondary | ICD-10-CM | POA: Diagnosis not present

## 2016-12-24 DIAGNOSIS — N898 Other specified noninflammatory disorders of vagina: Secondary | ICD-10-CM | POA: Diagnosis not present

## 2016-12-24 MED ORDER — DOXYCYCLINE HYCLATE 100 MG PO CAPS: 100.0000 mg | ORAL_CAPSULE | Freq: Two times a day (BID) | ORAL | 0 refills | Status: DC

## 2016-12-24 NOTE — Progress Notes (Signed)
Chief Complaint  Patient presents with  . Follow-up    seen urgent care- vaginal discharge, pressure    Blood pressure 110/80, pulse 84, weight 146 lb (66.2 kg), last menstrual period 11/24/2016.  30 y.o. [id number] Patient's last menstrual period was 11/24/2016. The current method of family planning is oral progesterone-only contraceptive.  Subjective Vaginal discharge for 4days Itching no Irritation yes Odor no Similar to previous no  Previous treatment has been treated with zithromycin and doxycycline  Objective Vulva:  normal appearing vulva with no masses, tenderness or lesions Vagina:  normal mucosa, thin grey discharge Cervix:  no cervical motion tenderness and no lesions Uterus:  normal size, contour, position, consistency, mobility, non-tender tender Adnexa: ovaries:present,  normal adnexa in size, nontender and no masses     Pertinent ROS No burning with urination, frequency or urgency No nausea, vomiting or diarrhea Nor fever chills or other constitutional symptoms   Labs or studies Wet Prep:   A sample of vaginal discharge was obtained from the posterior fornix using a cotton swab. 2 drops of saline were placed on a slide and the cotton swab was immersed in the saline. Microscopic evaluation was performed and results were as follows:  Negative  for yeast  Negative for  for clue cells , consistent with Bacterial vaginosis Negative for trichomonas  Normal WBC population   Whiff test: Negative     Impression Diagnoses this Encounter::   ICD-9-CM ICD-10-CM   1. Cervicitis and endocervicitis 616.0 N72     Established relevant diagnosis(es):   Plan/Recommendations: Meds ordered this encounter  Medications  . DISCONTD: doxycycline (VIBRAMYCIN) 100 MG capsule    Sig: Take 100 mg by mouth 2 (two) times daily.  Marland Kitchen doxycycline (VIBRAMYCIN) 100 MG capsule    Sig: Take 1 capsule (100 mg total) by mouth 2 (two) times daily.    Dispense:  8  capsule    Refill:  0    Labs or Scans Ordered: No orders of the defined types were placed in this encounter.   Management:: Finish 10 day course of doxycycline and follow up exam 2 weeks  Follow up Return in about 2 weeks (around 01/07/2017) for Follow up, with Dr Despina Hidden.     All questions were answered.  Past Medical History:  Diagnosis Date  . Anxiety   . Back pain   . Boil 05/14/2016  . Breast pain 05/11/2015  . Contraceptive management 08/30/2013  . GERD (gastroesophageal reflux disease)   . LLQ pain 11/04/2014  . OAB (overactive bladder) 04/20/2013  . Reflux   . Smoker 05/14/2016  . Soreness breast 10/25/2013   Left breast sore UOQ, no masses  . Urinary frequency 11/04/2014    History reviewed. No pertinent surgical history.  OB History    Gravida Para Term Preterm AB Living   2 2 1 1   2    SAB TAB Ectopic Multiple Live Births           2      Allergies  Allergen Reactions  . Amoxicillin Hives  . Cephalosporins Nausea And Vomiting  . Septra [Bactrim] Nausea And Vomiting  . Ciprocinonide [Fluocinolone] Nausea And Vomiting  . Penicillins Hives    Social History   Social History  . Marital status: Married    Spouse name: N/A  . Number of children: N/A  . Years of education: N/A   Social History Main Topics  . Smoking status: Current Every Day Smoker  Packs/day: 0.50    Years: 15.00    Types: Cigarettes  . Smokeless tobacco: Never Used  . Alcohol use No  . Drug use: No  . Sexual activity: Yes    Birth control/ protection: Pill   Other Topics Concern  . None   Social History Narrative  . None    Family History  Problem Relation Age of Onset  . Cancer Maternal Grandmother   . Cancer Maternal Grandfather   . CAD Father   . Stroke Father   . Liver disease Father   . Alcohol abuse Father   . Other Mother     brain tumor  . Asthma Son   . Bronchitis Son   . ADD / ADHD Son

## 2017-01-02 ENCOUNTER — Other Ambulatory Visit: Payer: Self-pay | Admitting: Adult Health

## 2017-01-09 ENCOUNTER — Ambulatory Visit: Payer: Medicaid Other | Admitting: Obstetrics & Gynecology

## 2017-03-10 ENCOUNTER — Other Ambulatory Visit (HOSPITAL_COMMUNITY): Payer: Self-pay | Admitting: Internal Medicine

## 2017-03-10 DIAGNOSIS — R1310 Dysphagia, unspecified: Secondary | ICD-10-CM

## 2017-03-13 ENCOUNTER — Ambulatory Visit (HOSPITAL_COMMUNITY)
Admission: RE | Admit: 2017-03-13 | Discharge: 2017-03-13 | Disposition: A | Discharge status: Home / Self Care | Payer: Medicaid Other | Source: Ambulatory Visit | Attending: Internal Medicine | Admitting: Internal Medicine

## 2017-03-13 DIAGNOSIS — R1310 Dysphagia, unspecified: Secondary | ICD-10-CM | POA: Diagnosis present

## 2017-04-14 ENCOUNTER — Ambulatory Visit (INDEPENDENT_AMBULATORY_CARE_PROVIDER_SITE_OTHER): Payer: Medicaid Other | Admitting: Otolaryngology

## 2017-06-06 ENCOUNTER — Telehealth: Payer: Self-pay | Admitting: *Deleted

## 2017-06-06 NOTE — Telephone Encounter (Signed)
Patient called requesting refill on BCP and Simvastatin. States she does not currently have insurance. Please advise.

## 2017-06-09 MED ORDER — NORETHIN ACE-ETH ESTRAD-FE 1-20 MG-MCG PO TABS: 1.0000 | ORAL_TABLET | Freq: Every day | ORAL | 4 refills | Status: DC

## 2017-06-09 MED ORDER — SIMVASTATIN 20 MG PO TABS: ORAL_TABLET | ORAL | 6 refills | Status: DC

## 2017-06-09 NOTE — Telephone Encounter (Signed)
Will refill zocor x 6 needs labs in future and will refill OCs, lost medicaid, try for family planning Medicaid

## 2017-06-30 ENCOUNTER — Telehealth: Payer: Self-pay | Admitting: *Deleted

## 2017-06-30 NOTE — Telephone Encounter (Signed)
Patient called stating she has had a discharge "sticky" and wants to make sure it is normal. She and her husband have decided to try to have another child so she has stopped taking her BCP. Informed patient that at various times during her cycle, the discharge can vary in texture. She wants to make appointment to discuss current meds and plan for pregnancy.

## 2017-07-04 ENCOUNTER — Emergency Department (HOSPITAL_COMMUNITY): Payer: Medicaid Other

## 2017-07-04 ENCOUNTER — Encounter (HOSPITAL_COMMUNITY): Payer: Self-pay | Admitting: Emergency Medicine

## 2017-07-04 ENCOUNTER — Emergency Department (HOSPITAL_COMMUNITY)
Admission: EM | Admit: 2017-07-04 | Discharge: 2017-07-04 | Disposition: A | Discharge status: Home / Self Care | Payer: Medicaid Other | Attending: Emergency Medicine | Admitting: Emergency Medicine

## 2017-07-04 DIAGNOSIS — Z79899 Other long term (current) drug therapy: Secondary | ICD-10-CM | POA: Insufficient documentation

## 2017-07-04 DIAGNOSIS — R109 Unspecified abdominal pain: Secondary | ICD-10-CM | POA: Diagnosis present

## 2017-07-04 DIAGNOSIS — N898 Other specified noninflammatory disorders of vagina: Secondary | ICD-10-CM | POA: Insufficient documentation

## 2017-07-04 DIAGNOSIS — N83202 Unspecified ovarian cyst, left side: Secondary | ICD-10-CM | POA: Diagnosis not present

## 2017-07-04 DIAGNOSIS — F17210 Nicotine dependence, cigarettes, uncomplicated: Secondary | ICD-10-CM | POA: Diagnosis not present

## 2017-07-04 DIAGNOSIS — R102 Pelvic and perineal pain: Secondary | ICD-10-CM | POA: Insufficient documentation

## 2017-07-04 LAB — WET PREP, GENITAL
CLUE CELLS WET PREP: NONE SEEN
SPERM: NONE SEEN
Trich, Wet Prep: NONE SEEN
Yeast Wet Prep HPF POC: NONE SEEN

## 2017-07-04 LAB — URINALYSIS, ROUTINE W REFLEX MICROSCOPIC
Bilirubin Urine: NEGATIVE
GLUCOSE, UA: NEGATIVE mg/dL
HGB URINE DIPSTICK: NEGATIVE
KETONES UR: NEGATIVE mg/dL
LEUKOCYTES UA: NEGATIVE
Nitrite: NEGATIVE
Protein, ur: NEGATIVE mg/dL
Specific Gravity, Urine: 1.014 (ref 1.005–1.030)
pH: 6 (ref 5.0–8.0)

## 2017-07-04 LAB — POC URINE PREG, ED: PREG TEST UR: NEGATIVE

## 2017-07-04 MED ORDER — DOXYCYCLINE HYCLATE 100 MG PO CAPS: 100.0000 mg | ORAL_CAPSULE | Freq: Two times a day (BID) | ORAL | 0 refills | Status: AC

## 2017-07-04 MED ORDER — HYDROCODONE-ACETAMINOPHEN 5-325 MG PO TABS
1.0000 | ORAL_TABLET | Freq: Once | ORAL | Status: AC
  Administered 2017-07-04: 1 via ORAL
  Filled 2017-07-04: qty 1

## 2017-07-04 MED ORDER — DOXYCYCLINE HYCLATE 100 MG PO TABS
100.0000 mg | ORAL_TABLET | Freq: Once | ORAL | Status: AC
  Administered 2017-07-04: 100 mg via ORAL
  Filled 2017-07-04: qty 1

## 2017-07-04 MED ORDER — AZITHROMYCIN 250 MG PO TABS
2000.0000 mg | ORAL_TABLET | Freq: Once | ORAL | Status: AC
  Administered 2017-07-04: 2000 mg via ORAL
  Filled 2017-07-04: qty 8

## 2017-07-04 NOTE — ED Triage Notes (Signed)
Pt c/o lower abd pain that wraps around to her lower back. Pt states she could be pregnant.

## 2017-07-04 NOTE — ED Provider Notes (Addendum)
[facility]-EMERGENCY DEPT Provider Note   CSN: [id number] Arrival date & time: 07/04/17  1931     History   Chief Complaint Chief Complaint  Patient presents with  . Abdominal Pain    HPI Rebecca [name] is a 31 y.o. female.   Abdominal Pain   This is a new problem. The current episode started 2 days ago. The problem occurs constantly. The problem has not changed since onset.The pain is located in the suprapubic region. The quality of the pain is pressure-like and aching. The pain is moderate.    Past Medical History:  Diagnosis Date  . Anxiety   . Back pain   . Boil 05/14/2016  . Breast pain 05/11/2015  . Contraceptive management 08/30/2013  . GERD (gastroesophageal reflux disease)   . LLQ pain 11/04/2014  . OAB (overactive bladder) 04/20/2013  . Reflux   . Smoker 05/14/2016  . Soreness breast 10/25/2013   Left breast sore UOQ, no masses  . Urinary frequency 11/04/2014    Patient Active Problem List   Diagnosis Date Noted  . Smoker 05/14/2016  . Boil 05/14/2016  . Breast pain 05/11/2015  . LLQ pain 11/04/2014  . Urinary frequency 11/04/2014  . Pyelonephritis 04/18/2014  . Soreness breast 10/25/2013  . Contraceptive management 08/30/2013  . OAB (overactive bladder) 04/20/2013  . Anxiety 04/05/2013    History reviewed. No pertinent surgical history.  OB History    Gravida Para Term Preterm AB Living   2 2 1 1   2    SAB TAB Ectopic Multiple Live Births           2       Home Medications    Prior to Admission medications   Medication Sig Start Date End Date Taking? Authorizing Provider  alprazolam Prudy Feeler(XANAX) 2 MG tablet Take 2 mg by mouth 4 (four) times daily.   Yes [provider]  HYDROcodone-acetaminophen (NORCO) 10-325 MG per tablet Take 1 tablet by mouth 4 (four) times daily.    Yes [provider]  Multiple Vitamin (MULTIVITAMIN WITH MINERALS) TABS Take 2 tablets by mouth daily.    Yes [provider]  RaNITidine HCl (ACID REDUCER  PO) Take by mouth 2 (two) times daily.   Yes [provider]  simvastatin (ZOCOR) 20 MG tablet TAKE 1 TABLET(20 MG) BY MOUTH DAILY 06/09/17  Yes Cyril MourningGriffin, Jennifer A, NP  doxycycline (VIBRAMYCIN) 100 MG capsule Take 1 capsule (100 mg total) by mouth 2 (two) times daily. One po bid x 7 days 07/04/17 07/18/17  Nyomie Ehrlich, Barbara CowerJason, MD    Family History Family History  Problem Relation Age of Onset  . Cancer Maternal Grandmother   . Cancer Maternal Grandfather   . CAD Father   . Stroke Father   . Liver disease Father   . Alcohol abuse Father   . Other Mother        brain tumor  . Asthma Son   . Bronchitis Son   . ADD / ADHD Son     Social History Social History  Substance Use Topics  . Smoking status: Current Every Day Smoker    Packs/day: 0.50    Years: 15.00    Types: Cigarettes  . Smokeless tobacco: Never Used  . Alcohol use No     Allergies   Amoxicillin; Cephalosporins; Septra [bactrim]; Ciprocinonide [fluocinolone]; and Penicillins   Review of Systems Review of Systems  Gastrointestinal: Positive for abdominal pain.  All other systems reviewed and are negative.  Physical Exam Updated Vital Signs BP (!) 128/96   Pulse 94   Temp 98.1 F (36.7 C)   Resp 17   Ht 5\' 1"  (1.549 m)   Wt 68 kg (150 lb)   LMP 06/16/2017   SpO2 99%   BMI 28.34 kg/m   Physical Exam  Constitutional: She appears well-developed and well-nourished.  HENT:  Head: Normocephalic and atraumatic.  Eyes: Conjunctivae and EOM are normal.  Neck: Normal range of motion.  Cardiovascular: Normal rate and regular rhythm.   Pulmonary/Chest: No stridor. No respiratory distress.  Abdominal: She exhibits no distension.  Genitourinary: There is no tenderness on the right labia. There is no tenderness on the left labia. Cervix exhibits motion tenderness. Right adnexum displays no mass and no tenderness. Left adnexum displays no mass and no tenderness. Vaginal discharge found.  Neurological: She is  alert.  Skin: Skin is warm and dry.  Nursing note and vitals reviewed.    ED Treatments / Results  Labs (all labs ordered are listed, but only abnormal results are displayed) Labs Reviewed  WET PREP, GENITAL - Abnormal; Notable for the following:       Result Value   WBC, Wet Prep HPF POC FEW (*)    All other components within normal limits  URINALYSIS, ROUTINE W REFLEX MICROSCOPIC - Abnormal; Notable for the following:    APPearance HAZY (*)    All other components within normal limits  RPR  HIV ANTIBODY (ROUTINE TESTING)  POC URINE PREG, ED  GC/CHLAMYDIA PROBE AMP ([place]) NOT AT [facility]    EKG  EKG Interpretation None       Radiology Ct Renal Stone Study  Result Date: 07/04/2017 CLINICAL DATA:  31 year old female with lower abdominal pain. EXAM: CT ABDOMEN AND PELVIS WITHOUT CONTRAST TECHNIQUE: Multidetector CT imaging of the abdomen and pelvis was performed following the standard protocol without IV contrast. COMPARISON:  Abdominal CT dated 12/29/2013 FINDINGS: Evaluation of this exam is limited in the absence of intravenous contrast. Lower chest: The visualized lung bases are clear. No intra-abdominal free air. Trace free fluid may be present within the pelvis. Hepatobiliary: No focal liver abnormality is seen. No gallstones, gallbladder wall thickening, or biliary dilatation. Pancreas: Unremarkable. No pancreatic ductal dilatation or surrounding inflammatory changes. Spleen: Normal in size without focal abnormality. Adrenals/Urinary Tract: The adrenal glands are unremarkable. There is an area of parenchymal atrophy and cortical scarring in the upper pole of the left kidney. There is a 3 mm cortical for parenchymal focus of calcification. There is no hydronephrosis or nephrolithiasis. The right kidney is unremarkable. The visualized ureters and urinary bladder appear unremarkable as well. Stomach/Bowel: There are small scattered colonic diverticula without active inflammatory  changes. There is no evidence of bowel obstruction or active inflammation. Normal appendix. Vascular/Lymphatic: The abdominal aorta and IVC are grossly unremarkable on this noncontrast study. No portal venous gas identified. There is no adenopathy. Reproductive: The uterus is anteverted and grossly unremarkable. Bilateral ovarian follicles noted. The left ovary is mildly enlarged. An area of increased density within the left ovary likely representing hemorrhage or proteinaceous content probably within a follicle or cyst. Further evaluation with pelvic ultrasound is recommended. Other: Small fat containing umbilical hernia. Musculoskeletal: There is left hemi sacralization of the L5. No acute osseous pathology. Faint 10 mm sclerotic lesion in the left posterior sacral bone appears similar to the study of 2015 most consistent with benign etiology. IMPRESSION: 1. Probable left ovarian hemorrhagic or proteinaceous cyst/follicle. Further evaluation with  pelvic ultrasound recommended. 2. No bowel obstruction or active inflammation.  Normal appendix. 3. Small scattered colonic diverticula. 4. No hydronephrosis or nephrolithiasis. Electronically Signed   By: Elgie CollardArash  Radparvar M.D.   On: 07/04/2017 21:50    Procedures Procedures (including critical care time)  Medications Ordered in ED Medications  HYDROcodone-acetaminophen (NORCO/VICODIN) 5-325 MG per tablet 1 tablet (1 tablet Oral Given 07/04/17 2047)  azithromycin (ZITHROMAX) tablet 2,000 mg (2,000 mg Oral Given 07/04/17 2158)  doxycycline (VIBRA-TABS) tablet 100 mg (100 mg Oral Given 07/04/17 2158)     Initial Impression / Assessment and Plan / ED Course  I have reviewed the triage vital signs and the nursing notes.  Pertinent labs & imaging results that were available during my care of the patient were reviewed by me and considered in my medical decision making (see chart for details).     Treating for PID, even though patient thinks it very unlikely. I  had advised a follow-up ultrasound tomorrow morning however patient states that she is seeing her oncologist on Monday and will discuss it with them as far as evaluating for a cyst. I think this is appropriate timing with us we'll cancel the outpatient ultrasound as even if this were a TOA or other complication of PID she would likely be trialed on outpatient antibiotics first anyway.  Final Clinical Impressions(s) / ED Diagnoses   Final diagnoses:  Pelvic pain  Cyst of left ovary    New Prescriptions Discharge Medication List as of 07/04/2017 10:08 PM    START taking these medications   Details  doxycycline (VIBRAMYCIN) 100 MG capsule Take 1 capsule (100 mg total) by mouth 2 (two) times daily. One po bid x 7 days, Starting Fri 07/04/2017, Until Fri 07/18/2017, Print         Samentha Perham, Barbara CowerJason, MD 07/05/17 1330

## 2017-07-06 LAB — RPR: RPR Ser Ql: NONREACTIVE

## 2017-07-06 LAB — HIV ANTIBODY (ROUTINE TESTING W REFLEX): HIV Screen 4th Generation wRfx: NONREACTIVE

## 2017-07-07 ENCOUNTER — Ambulatory Visit (INDEPENDENT_AMBULATORY_CARE_PROVIDER_SITE_OTHER): Payer: Medicaid Other | Admitting: Obstetrics & Gynecology

## 2017-07-07 ENCOUNTER — Encounter: Payer: Self-pay | Admitting: Obstetrics & Gynecology

## 2017-07-07 ENCOUNTER — Ambulatory Visit: Payer: Medicaid Other | Admitting: Adult Health

## 2017-07-07 ENCOUNTER — Other Ambulatory Visit (HOSPITAL_COMMUNITY)
Admission: RE | Admit: 2017-07-07 | Discharge: 2017-07-07 | Disposition: A | Discharge status: Home / Self Care | Payer: Medicaid Other | Source: Ambulatory Visit | Attending: Obstetrics & Gynecology | Admitting: Obstetrics & Gynecology

## 2017-07-07 ENCOUNTER — Encounter (INDEPENDENT_AMBULATORY_CARE_PROVIDER_SITE_OTHER): Payer: Medicaid Other | Admitting: Obstetrics & Gynecology

## 2017-07-07 VITALS — BP 120/72 | HR 92 | Ht 61.0 in | Wt 146.0 lb

## 2017-07-07 DIAGNOSIS — Z01419 Encounter for gynecological examination (general) (routine) without abnormal findings: Secondary | ICD-10-CM | POA: Diagnosis not present

## 2017-07-07 DIAGNOSIS — Z0000 Encounter for general adult medical examination without abnormal findings: Secondary | ICD-10-CM | POA: Diagnosis not present

## 2017-07-07 DIAGNOSIS — Z124 Encounter for screening for malignant neoplasm of cervix: Secondary | ICD-10-CM | POA: Diagnosis not present

## 2017-07-07 LAB — GC/CHLAMYDIA PROBE AMP (~~LOC~~) NOT AT ARMC
Chlamydia: NEGATIVE
NEISSERIA GONORRHEA: NEGATIVE

## 2017-07-07 NOTE — Progress Notes (Signed)
Patient ID: Rebecca [name], female   DOB: [birth year], [age] y.o.   MRN: [id number] Subjective:     Rebecca [name] is a 31 y.o. female here for a routine exam.  Patient's last menstrual period was 06/16/2017. [id number] Birth Control Method:  None stopped birth control pills last month Menstrual Calendar(currently): Regular monthly on pills  Current complaints: Patient went to the ED 3 days ago for some lower pelvic pain and was found to have a functional cyst on her left ovary otherwise workup was negative.  GC and chlamydia were negative as was her wet prep  Current acute medical issues:  Benzodiazepine and narcotic use chronically addressed with patient since she states she isn't showing getting pregnant   Recent Gynecologic History Patient's last menstrual period was 06/16/2017. Last Pap: 2016,  normal Last mammogram: na,    Past Medical History:  Diagnosis Date  . Anxiety   . Back pain   . Boil 05/14/2016  . Breast pain 05/11/2015  . Contraceptive management 08/30/2013  . GERD (gastroesophageal reflux disease)   . LLQ pain 11/04/2014  . OAB (overactive bladder) 04/20/2013  . Reflux   . Smoker 05/14/2016  . Soreness breast 10/25/2013   Left breast sore UOQ, no masses  . Urinary frequency 11/04/2014    No past surgical history on file.  OB History    Gravida Para Term Preterm AB Living   2 2 1 1   2    SAB TAB Ectopic Multiple Live Births           2      Social History   Social History  . Marital status: Married    Spouse name: N/A  . Number of children: N/A  . Years of education: N/A   Social History Main Topics  . Smoking status: Current Every Day Smoker    Packs/day: 0.50    Years: 15.00    Types: Cigarettes  . Smokeless tobacco: Never Used  . Alcohol use No  . Drug use: No  . Sexual activity: Yes    Birth control/ protection: None   Other Topics Concern  . Not on file   Social History Narrative  . No narrative on file    Family History  Problem  Relation Age of Onset  . Cancer Maternal Grandmother   . Cancer Maternal Grandfather   . CAD Father   . Stroke Father   . Liver disease Father   . Alcohol abuse Father   . Other Mother        brain tumor  . Asthma Son   . Bronchitis Son   . ADD / ADHD Son      Current Outpatient Prescriptions:  .  alprazolam (XANAX) 2 MG tablet, Take 2 mg by mouth 4 (four) times daily., Disp: , Rfl:  .  doxycycline (VIBRAMYCIN) 100 MG capsule, Take 1 capsule (100 mg total) by mouth 2 (two) times daily. One po bid x 7 days, Disp: 28 capsule, Rfl: 0 .  HYDROcodone-acetaminophen (NORCO) 10-325 MG per tablet, Take 1 tablet by mouth 4 (four) times daily. , Disp: , Rfl:  .  Multiple Vitamin (MULTIVITAMIN WITH MINERALS) TABS, Take 2 tablets by mouth daily. , Disp: , Rfl:  .  RaNITidine HCl (ACID REDUCER PO), Take by mouth 2 (two) times daily., Disp: , Rfl:  .  simvastatin (ZOCOR) 20 MG tablet, TAKE 1 TABLET(20 MG) BY MOUTH DAILY, Disp: 30 tablet, Rfl: 6  Review of Systems  Review of Systems  Constitutional: Negative for fever, chills, weight loss, malaise/fatigue and diaphoresis.  HENT: Negative for hearing loss, ear pain, nosebleeds, congestion, sore throat, neck pain, tinnitus and ear discharge.   Eyes: Negative for blurred vision, double vision, photophobia, pain, discharge and redness.  Respiratory: Negative for cough, hemoptysis, sputum production, shortness of breath, wheezing and stridor.   Cardiovascular: Negative for chest pain, palpitations, orthopnea, claudication, leg swelling and PND.  Gastrointestinal: negative for abdominal pain. Negative for heartburn, nausea, vomiting, diarrhea, constipation, blood in stool and melena.  Genitourinary: Negative for dysuria, urgency, frequency, hematuria and flank pain.  Musculoskeletal: Negative for myalgias, back pain, joint pain and falls.  Skin: Negative for itching and rash.  Neurological: Negative for dizziness, tingling, tremors, sensory change,  speech change, focal weakness, seizures, loss of consciousness, weakness and headaches.  Endo/Heme/Allergies: Negative for environmental allergies and polydipsia. Does not bruise/bleed easily.  Psychiatric/Behavioral: Negative for depression, suicidal ideas, hallucinations, memory loss and substance abuse. The patient is not nervous/anxious and does not have insomnia.        Objective:  Last menstrual period 06/16/2017.   Physical Exam  Vitals reviewed. Constitutional: She is oriented to person, place, and time. She appears well-developed and well-nourished.  HENT:  Head: Normocephalic and atraumatic.        Right Ear: External ear normal.  Left Ear: External ear normal.  Nose: Nose normal.  Mouth/Throat: Oropharynx is clear and moist.  Eyes: Conjunctivae and EOM are normal. Pupils are equal, round, and reactive to light. Right eye exhibits no discharge. Left eye exhibits no discharge. No scleral icterus.  Neck: Normal range of motion. Neck supple. No tracheal deviation present. No thyromegaly present.  Cardiovascular: Normal rate, regular rhythm, normal heart sounds and intact distal pulses.  Exam reveals no gallop and no friction rub.   No murmur heard. Respiratory: Effort normal and breath sounds normal. No respiratory distress. She has no wheezes. She has no rales. She exhibits no tenderness.  GI: Soft. Bowel sounds are normal. She exhibits no distension and no mass. There is no tenderness. There is no rebound and no guarding.  Genitourinary:  Breasts no masses skin changes or nipple changes bilaterally      Vulva is normal without lesions Vagina is pink moist without discharge Cervix normal in appearance and pap is done Uterus is normal size shape and contour Adnexa is negative with normal sized ovaries   Musculoskeletal: Normal range of motion. She exhibits no edema and no tenderness.  Neurological: She is alert and oriented to person, place, and time. She has normal reflexes.  She displays normal reflexes. No cranial nerve deficit. She exhibits normal muscle tone. Coordination normal.  Skin: Skin is warm and dry. No rash noted. No erythema. No pallor.  Psychiatric: She has a normal mood and affect. Her behavior is normal. Judgment and thought content normal.       Medications Ordered at today's visit: No orders of the defined types were placed in this encounter.   Other orders placed at today's visit: No orders of the defined types were placed in this encounter.     Assessment:    Healthy female exam.    Plan:    Follow up in: prn months.     Return in about 1 year (around 07/07/2018), or if symptoms worsen or fail to improve.

## 2017-07-10 LAB — CYTOLOGY - PAP
CHLAMYDIA, DNA PROBE: NEGATIVE
Diagnosis: NEGATIVE
HPV: NOT DETECTED
Neisseria Gonorrhea: NEGATIVE

## 2017-09-04 ENCOUNTER — Other Ambulatory Visit: Payer: Self-pay | Admitting: Adult Health

## 2017-09-22 ENCOUNTER — Encounter: Payer: Self-pay | Admitting: Adult Health

## 2017-09-22 ENCOUNTER — Ambulatory Visit (INDEPENDENT_AMBULATORY_CARE_PROVIDER_SITE_OTHER): Payer: Medicaid Other | Admitting: Adult Health

## 2017-09-22 VITALS — BP 128/90 | HR 83 | Ht 61.0 in | Wt 149.4 lb

## 2017-09-22 DIAGNOSIS — E7800 Pure hypercholesterolemia, unspecified: Secondary | ICD-10-CM | POA: Diagnosis not present

## 2017-09-22 DIAGNOSIS — L739 Follicular disorder, unspecified: Secondary | ICD-10-CM | POA: Diagnosis not present

## 2017-09-22 MED ORDER — DOXYCYCLINE HYCLATE 100 MG PO TABS: 100.0000 mg | ORAL_TABLET | Freq: Two times a day (BID) | ORAL | 1 refills | Status: DC

## 2017-09-22 NOTE — Patient Instructions (Signed)
Get labs  F/U prn

## 2017-09-22 NOTE — Progress Notes (Signed)
Subjective:     Patient ID: Rebecca StablerShirley C [name], female   DOB: [birth year], 31 y.o.   MRN: [id number]  HPI [name] is a 31 year old white female in with spot in right inner thigh, ?boil it is smaller that it was.And she needs refills on zocor, but needs labs first.   Review of Systems Has spot right inner thigh,?boil Reviewed past medical,surgical, social and family history. Reviewed medications and allergies.     Objective:   Physical Exam BP 128/90 (BP Location: Left Arm, Patient Position: Sitting, Cuff Size: Small)   Pulse 83   Ht 5\' 1"  (1.549 m)   Wt 149 lb 6.4 oz (67.8 kg)   LMP 09/09/2017   BMI 28.23 kg/m Skin warm and dry, has 1 cm area right inner thigh that is purplish and resolving, but tender,looks like hair follicle was infected.Will rx doxycycline and do not squeeze and will get labs fasting in near future.     Assessment:     1. Folliculitis   2. Elevated cholesterol       Plan:     Rx doxycycline 10 0mg  take 1 bid #28 with 1 refill Do not squeeze  Check CMP and lipids Follow up prn

## 2017-10-30 NOTE — Progress Notes (Signed)
This encounter was created in error - please disregard.

## 2017-11-13 ENCOUNTER — Telehealth: Payer: Self-pay | Admitting: Adult Health

## 2017-11-13 MED ORDER — NITROFURANTOIN MONOHYD MACRO 100 MG PO CAPS: 100.0000 mg | ORAL_CAPSULE | Freq: Two times a day (BID) | ORAL | 0 refills | Status: DC

## 2017-11-13 NOTE — Telephone Encounter (Signed)
Left message [place] [facility] [place]

## 2018-04-11 ENCOUNTER — Other Ambulatory Visit: Payer: Self-pay

## 2018-04-11 ENCOUNTER — Encounter (HOSPITAL_COMMUNITY): Payer: Self-pay | Admitting: Emergency Medicine

## 2018-04-11 ENCOUNTER — Emergency Department (HOSPITAL_COMMUNITY)
Admission: EM | Admit: 2018-04-11 | Discharge: 2018-04-11 | Disposition: A | Discharge status: Home / Self Care | Payer: Medicaid Other | Attending: Emergency Medicine | Admitting: Emergency Medicine

## 2018-04-11 ENCOUNTER — Emergency Department (HOSPITAL_COMMUNITY): Payer: Medicaid Other

## 2018-04-11 DIAGNOSIS — R1030 Lower abdominal pain, unspecified: Secondary | ICD-10-CM | POA: Diagnosis present

## 2018-04-11 DIAGNOSIS — F17210 Nicotine dependence, cigarettes, uncomplicated: Secondary | ICD-10-CM | POA: Diagnosis not present

## 2018-04-11 DIAGNOSIS — R82998 Other abnormal findings in urine: Secondary | ICD-10-CM | POA: Diagnosis not present

## 2018-04-11 DIAGNOSIS — K5730 Diverticulosis of large intestine without perforation or abscess without bleeding: Secondary | ICD-10-CM | POA: Insufficient documentation

## 2018-04-11 LAB — CBC
HCT: 40.9 % (ref 36.0–46.0)
Hemoglobin: 13.3 g/dL (ref 12.0–15.0)
MCH: 29.6 pg (ref 26.0–34.0)
MCHC: 32.5 g/dL (ref 30.0–36.0)
MCV: 91.1 fL (ref 78.0–100.0)
PLATELETS: 209 10*3/uL (ref 150–400)
RBC: 4.49 MIL/uL (ref 3.87–5.11)
RDW: 13.5 % (ref 11.5–15.5)
WBC: 9.3 10*3/uL (ref 4.0–10.5)

## 2018-04-11 LAB — COMPREHENSIVE METABOLIC PANEL
ALK PHOS: 107 U/L (ref 38–126)
ALT: 43 U/L (ref 14–54)
AST: 28 U/L (ref 15–41)
Albumin: 3.7 g/dL (ref 3.5–5.0)
Anion gap: 7 (ref 5–15)
BILIRUBIN TOTAL: 0.6 mg/dL (ref 0.3–1.2)
BUN: 6 mg/dL (ref 6–20)
CALCIUM: 9.1 mg/dL (ref 8.9–10.3)
CO2: 25 mmol/L (ref 22–32)
CREATININE: 0.67 mg/dL (ref 0.44–1.00)
Chloride: 104 mmol/L (ref 101–111)
GFR calc Af Amer: >60 mL/min (ref >60)
GLUCOSE: 99 mg/dL (ref 65–99)
Potassium: 3.5 mmol/L (ref 3.5–5.1)
Sodium: 136 mmol/L (ref 135–145)
TOTAL PROTEIN: 6.5 g/dL (ref 6.5–8.1)

## 2018-04-11 LAB — URINALYSIS, ROUTINE W REFLEX MICROSCOPIC
BILIRUBIN URINE: NEGATIVE
Glucose, UA: NEGATIVE mg/dL
Hgb urine dipstick: NEGATIVE
KETONES UR: NEGATIVE mg/dL
LEUKOCYTES UA: NEGATIVE
Nitrite: NEGATIVE
PH: 7 (ref 5.0–8.0)
PROTEIN: NEGATIVE mg/dL
Specific Gravity, Urine: 1.01 (ref 1.005–1.030)

## 2018-04-11 LAB — LIPASE, BLOOD: Lipase: 30 U/L (ref 11–51)

## 2018-04-11 LAB — HCG, QUANTITATIVE, PREGNANCY: hCG, Beta Chain, Quant, S: <1 m[IU]/mL (ref <5)

## 2018-04-11 LAB — POC URINE PREG, ED: PREG TEST UR: NEGATIVE

## 2018-04-11 MED ORDER — ONDANSETRON 4 MG PO TBDP: 4.0000 mg | ORAL_TABLET | Freq: Three times a day (TID) | ORAL | 0 refills | Status: DC | PRN

## 2018-04-11 MED ORDER — NAPROXEN 250 MG PO TABS: 250.0000 mg | ORAL_TABLET | Freq: Two times a day (BID) | ORAL | 0 refills | Status: DC | PRN

## 2018-04-11 NOTE — ED Provider Notes (Signed)
[facility] EMERGENCY DEPARTMENT Provider Note   CSN: [id number] Arrival date & time: 04/11/18  1520     History   Chief Complaint Chief Complaint  Patient presents with  . Back Pain  . Abdominal Pain    HPI Rebecca [name] is a 32 y.o. female.  HPI  Pt was seen at 1550.  Per pt, c/o gradual onset and persistence of constant suprapubic abd "pain" for the past 3 days.  Has been associated with nausea and "very yellow foul smelling urine."  Describes the abd pain as "aching" with radiation into her lower back. Pt states she is also concerned she may be pregnant.  Denies vomiting/diarrhea, no fevers, no rash, no CP/SOB, no black or blood in stools, no hematuria, no vaginal bleeding/discharge.      Past Medical History:  Diagnosis Date  . Anxiety   . Back pain   . Boil 05/14/2016  . Breast pain 05/11/2015  . Contraceptive management 08/30/2013  . GERD (gastroesophageal reflux disease)   . LLQ pain 11/04/2014  . OAB (overactive bladder) 04/20/2013  . Reflux   . Smoker 05/14/2016  . Soreness breast 10/25/2013   Left breast sore UOQ, no masses  . Urinary frequency 11/04/2014    Patient Active Problem List   Diagnosis Date Noted  . Folliculitis 09/22/2017  . Elevated cholesterol 09/22/2017  . Smoker 05/14/2016  . Boil 05/14/2016  . Breast pain 05/11/2015  . LLQ pain 11/04/2014  . Urinary frequency 11/04/2014  . Pyelonephritis 04/18/2014  . Soreness breast 10/25/2013  . Contraceptive management 08/30/2013  . OAB (overactive bladder) 04/20/2013  . Anxiety 04/05/2013    History reviewed. No pertinent surgical history.   OB History    Gravida  2   Para  2   Term  1   Preterm  1   AB      Living  2     SAB      TAB      Ectopic      Multiple      Live Births  2            Home Medications    Prior to Admission medications   Medication Sig Start Date End Date Taking? Authorizing Provider  Multiple Vitamin (MULTIVITAMIN WITH MINERALS) TABS Take  2 tablets by mouth daily.    Yes [provider]  omeprazole (PRILOSEC) 20 MG capsule Take 20 mg by mouth daily.   Yes [provider]  simvastatin (ZOCOR) 20 MG tablet TAKE 1 TABLET(20 MG) BY MOUTH DAILY 09/10/17  Yes Adline Potter, NP    Family History Family History  Problem Relation Age of Onset  . Cancer Maternal Grandmother   . Cancer Maternal Grandfather   . CAD Father   . Stroke Father   . Liver disease Father   . Alcohol abuse Father   . Other Mother        brain tumor  . Asthma Son   . Bronchitis Son   . ADD / ADHD Son     Social History Social History   Tobacco Use  . Smoking status: Current Every Day Smoker    Packs/day: 0.50    Years: 15.00    Pack years: 7.50    Types: Cigarettes  . Smokeless tobacco: Never Used  Substance Use Topics  . Alcohol use: No  . Drug use: No     Allergies   Amoxicillin; Cephalosporins; Septra [bactrim]; Ciprocinonide [fluocinolone]; and Penicillins  Review of Systems Review of Systems ROS: Statement: All systems negative except as marked or noted in the HPI; Constitutional: Negative for fever and chills. ; ; Eyes: Negative for eye pain, redness and discharge. ; ; ENMT: Negative for ear pain, hoarseness, nasal congestion, sinus pressure and sore throat. ; ; Cardiovascular: Negative for chest pain, palpitations, diaphoresis, dyspnea and peripheral edema. ; ; Respiratory: Negative for cough, wheezing and stridor. ; ; Gastrointestinal: +nausea, abd pain. Negative for vomiting, diarrhea, blood in stool, hematemesis, jaundice and rectal bleeding. . ; ; Genitourinary: +dysuria. Negative for hematuria. ; ; GYN:  No pelvic pain, no vaginal bleeding, no vaginal discharge, no vulvar pain. ;;   Musculoskeletal: +LBP. Negative for neck pain. Negative for swelling and trauma.; ; Skin: Negative for pruritus, rash, abrasions, blisters, bruising and skin lesion.; ; Neuro: Negative for headache, lightheadedness and neck  stiffness. Negative for weakness, altered level of consciousness, altered mental status, extremity weakness, paresthesias, involuntary movement, seizure and syncope.       Physical Exam Updated Vital Signs BP 118/80 (BP Location: Left Arm)   Pulse 62   Temp 97.6 F (36.4 C) (Oral)   Resp 15   Ht  (1.549 m)   Wt 62.6 kg (138 lb)   LMP 03/11/2018 (Exact Date)   SpO2 98%   BMI 26.07 kg/m   Physical Exam 1555: Physical examination:  Nursing notes reviewed; Vital signs and O2 SAT reviewed;  Constitutional: Well developed, Well nourished, Well hydrated, In no acute distress; Head:  Normocephalic, atraumatic; Eyes: EOMI, PERRL, No scleral icterus; ENMT: Mouth and pharynx normal, Mucous membranes moist; Neck: Supple, Full range of motion, No lymphadenopathy; Cardiovascular: Regular rate and rhythm, No gallop; Respiratory: Breath sounds clear & equal bilaterally, No wheezes.  Speaking full sentences with ease, Normal respiratory effort/excursion; Chest: Nontender, Movement normal; Abdomen: Soft, +suprapubic tenderness to palp. No rebound or guarding.  Nondistended, Normal bowel sounds; Genitourinary: No CVA tenderness; Spine:  No midline CS, TS, LS tenderness. +TTP bilat lumbar paraspinal muscles.;; Extremities: Peripheral pulses normal, No tenderness, No edema, No calf edema or asymmetry.; Neuro: AA&Ox3, Major CN grossly intact.  Speech clear. No gross focal motor or sensory deficits in extremities.; Skin: Color normal, Warm, Dry.   ED Treatments / Results  Labs (all labs ordered are listed, but only abnormal results are displayed)   EKG None  Radiology   Procedures Procedures (including critical care time)  Medications Ordered in ED Medications - No data to display   Initial Impression / Assessment and Plan / ED Course  I have reviewed the triage vital signs and the nursing notes.  Pertinent labs & imaging results that were available during my care of the patient were  reviewed by me and considered in my medical decision making (see chart for details).  MDM Reviewed: previous chart, nursing note and vitals Reviewed previous: labs Interpretation: labs    Results for orders placed or performed during the hospital encounter of 04/11/18  Lipase, blood  Result Value Ref Range   Lipase 30 11 - 51 U/L  Comprehensive metabolic panel  Result Value Ref Range   Sodium 136 135 - 145 mmol/L   Potassium 3.5 3.5 - 5.1 mmol/L   Chloride 104 101 - 111 mmol/L   CO2 25 22 - 32 mmol/L   Glucose, Bld 99 65 - 99 mg/dL   BUN 6 6 - 20 mg/dL   Creatinine, Ser 1.61 0.44 - 1.00 mg/dL   Calcium 9.1 8.9 - 09.6 mg/dL  Total Protein 6.5 6.5 - 8.1 g/dL   Albumin 3.7 3.5 - 5.0 g/dL   AST 28 15 - 41 U/L   ALT 43 14 - 54 U/L   Alkaline Phosphatase 107 38 - 126 U/L   Total Bilirubin 0.6 0.3 - 1.2 mg/dL   GFR calc non Af Amer >60 >60 mL/min   GFR calc Af Amer >60 >60 mL/min   Anion gap 7 5 - 15  CBC  Result Value Ref Range   WBC 9.3 4.0 - 10.5 K/uL   RBC 4.49 3.87 - 5.11 MIL/uL   Hemoglobin 13.3 12.0 - 15.0 g/dL   HCT 16.1 09.6 - 04.5 %   MCV 91.1 78.0 - 100.0 fL   MCH 29.6 26.0 - 34.0 pg   MCHC 32.5 30.0 - 36.0 g/dL   RDW 40.9 81.1 - 91.4 %   Platelets 209 150 - 400 K/uL  Urinalysis, Routine w reflex microscopic  Result Value Ref Range   Color, Urine YELLOW YELLOW   APPearance CLEAR CLEAR   Specific Gravity, Urine 1.010 1.005 - 1.030   pH 7.0 5.0 - 8.0   Glucose, UA NEGATIVE NEGATIVE mg/dL   Hgb urine dipstick NEGATIVE NEGATIVE   Bilirubin Urine NEGATIVE NEGATIVE   Ketones, ur NEGATIVE NEGATIVE mg/dL   Protein, ur NEGATIVE NEGATIVE mg/dL   Nitrite NEGATIVE NEGATIVE   Leukocytes, UA NEGATIVE NEGATIVE  hCG, quantitative, pregnancy  Result Value Ref Range   hCG, Beta Chain, Quant, S <1 <5 mIU/mL  POC Urine Pregnancy, ED (do NOT order at [facility])  Result Value Ref Range   Preg Test, Ur NEGATIVE NEGATIVE   Ct Renal Stone Study Result Date: 04/11/2018 CLINICAL  DATA:  Back pain, lower abdominal pain for 2 days, negative pregnancy test, smoker EXAM: CT ABDOMEN AND PELVIS WITHOUT CONTRAST TECHNIQUE: Multidetector CT imaging of the abdomen and pelvis was performed following the standard protocol without IV contrast. Sagittal and coronal MPR images reconstructed from axial data set. Oral contrast not administered for this indication. COMPARISON:  07/04/2017 FINDINGS: Lower chest: Dependent bibasilar atelectasis Hepatobiliary: Gallbladder and liver normal appearance Pancreas: Normal appearance Spleen: Normal appearance Adrenals/Urinary Tract: Tiny nonobstructing calculus at upper pole LEFT kidney. Abnormal axis of RIGHT kidney again noted. Adrenal glands, kidneys, ureters, and bladder otherwise normal appearance. Stomach/Bowel: Normal appendix. Minimal diverticulosis of descending colon. No CT evidence of acute diverticulitis. Stomach and bowel loops otherwise normal appearance. Vascular/Lymphatic: Small LEFT pelvic phleboliths. Vascular structures otherwise unremarkable. No adenopathy. Reproductive: Unremarkable uterus and ovaries for age. Other: No free air or free fluid. No hernia or acute inflammatory process. Musculoskeletal: Osseous structures unremarkable. IMPRESSION: Minimal diverticulosis of descending colon. Tiny nonobstructing calculus LEFT kidney. No acute intra-abdominal or intrapelvic abnormalities. Electronically Signed   By: Ulyses Southward M.D.   On: 04/11/2018 18:15    1840:  Pt now refuses pelvic exam and wants to go home. She is requesting discharge now and states she has an OB/GYN MD she can call on Monday for f/u this week. Workup is reassuring otherwise. Dx and testing d/w pt and family.  Questions answered.  Verb understanding, agreeable to d/c home with outpt f/u.   Final Clinical Impressions(s) / ED Diagnoses   Final diagnoses:  None    ED Discharge Orders    None       Samuel Jester, DO 04/17/18 [id number]

## 2018-04-11 NOTE — Discharge Instructions (Signed)
Take the prescriptions as directed.  Apply moist heat to the area(s) of discomfort, for 15 minutes at a time, several times per day for the next few days.  Do not fall asleep on a heating pack.  Call your regular medical doctor and your OB/GYN doctor on Monday to schedule a follow up appointment this week.  Return to the Emergency Department immediately if worsening.

## 2018-04-11 NOTE — ED Triage Notes (Signed)
Pt c/o back pain and lower abdominal pain x 2 days. Possible positive home pregnancy test (states one line was darker than the other). Denies vaginal bleeding. Pt also reports nausea with one episode of diarrhea yesterday.

## 2018-04-14 ENCOUNTER — Encounter: Payer: Self-pay | Admitting: Women's Health

## 2018-04-14 ENCOUNTER — Ambulatory Visit: Payer: Medicaid Other | Admitting: Women's Health

## 2018-04-14 VITALS — BP 120/80 | HR 99 | Ht 60.0 in | Wt 134.6 lb

## 2018-04-14 DIAGNOSIS — R102 Pelvic and perineal pain: Secondary | ICD-10-CM | POA: Diagnosis not present

## 2018-04-14 DIAGNOSIS — M545 Low back pain, unspecified: Secondary | ICD-10-CM

## 2018-04-14 NOTE — Progress Notes (Addendum)
   GYN VISIT Patient name: Rebecca [name] MRN [id number]  Date of birth: September 08, 1986 Chief Complaint:   Follow-up (ER- pelvic pain)  History of Present Illness:   Rebecca [name] is a 32 y.o. [id number] Caucasian female being seen today for f/u ED visit 5/11 for low back and pelvic pain, now x 6days. Reports frequent urination, which she has a h/o overactive bladder and had to stop meds d/t insurance, as well as strong ammonia smell. Denies abnormal discharge, itching/odor/irritation.  Off COCs x almost 1 year, trying to conceive x 2-71mths, period is now about 3-4 days late. Took HPT x 2 prior to ED visit that were faintly positive, BHCG at hospital neg. Taking pnv. Denies n/v, fever/chills.     Had CT renal stone CT in ED, tiny Lt nonobstructing renal calculus, minimal diverticulosis of descening colon  Patient's last menstrual period was 03/12/2018. The current method of family planning is none. Last pap 2018. Results were:  normal Review of Systems:   Pertinent items are noted in HPI Denies fever/chills, dizziness, headaches, visual disturbances, fatigue, shortness of breath, chest pain, abdominal pain, vomiting, abnormal vaginal discharge/itching/odor/irritation, problems with periods, bowel movements, urination, or intercourse unless otherwise stated above.  Pertinent History Reviewed:  Reviewed past medical,surgical, social, obstetrical and family history.  Reviewed problem list, medications and allergies. Physical Assessment:   Vitals:   04/14/18 1543  BP: 120/80  Pulse: 99  Weight: 134 lb 9.6 oz (61.1 kg)  Height: 5' (1.524 m)  Body mass index is 26.29 kg/m.       Physical Examination:   General appearance: alert, well appearing, and in no distress  Mental status: alert, oriented to person, place, and time  Skin: warm & dry   Cardiovascular: normal heart rate noted  Respiratory: normal respiratory effort, no distress  Abdomen: soft, non-tender   Pelvic: VULVA: normal  appearing vulva with no masses, tenderness or lesions, VAGINA: normal appearing vagina with normal color and discharge, no lesions, CERVIX: normal appearing cervix without discharge or lesions, UTERUS: uterus is normal size, shape, consistency and nontender, ADNEXA: bilateral tenderness, Lt >Rt  Extremities: no edema   No results found for this or any previous visit (from the past 24 hour(s)).  Assessment & Plan:  1) Low back/pelvic pain with bilateral adnexal pain> will send Nuswab plus and urine cx. Offered pelvic u/s which she wants to do, to call as soon as next period starts (if it does) to schedule u/s for when period goes off  2) Trying to conceive> if no period in 1wk, take another HPT, if + let us know. Continue pnv.  Meds: No orders of the defined types were placed in this encounter.   Orders Placed This Encounter  Procedures  . Urine Culture  . NuSwab Vaginitis Plus (VG+)    Return for will call w/ next period for pelvic u/s and f/u.  Cheral Marker CNM, [facility] 04/14/2018 4:21 PM

## 2018-04-14 NOTE — Patient Instructions (Addendum)
Call when next period starts to schedule ultrasound and follow-up visit for when next period goes off  If you are trying to get pregnant:   Have sex every other day on days 7-24 of your cycle (Day 1 is the 1st day of your period)  Heimdal before sex  Lay with your hips elevated on pillows for 20-17mins after sex  Do not smoke or drink alcohol  Lose weight if you are overweight  Take a prenatal vitamin with at least of folic acid  Decrease stress in your life  For Him:   Wear boxers instead of briefs  Avoid hot baths/jacuzzi  Vit C supplement  Do not smoke or drink alcohol  Lose weight if you are overweight  Keep in mind that it can be normal to take up to a year to become pregnant 80-90% of women will become pregnant within 1 year of trying

## 2018-04-16 ENCOUNTER — Telehealth: Payer: Self-pay | Admitting: Women's Health

## 2018-04-16 ENCOUNTER — Other Ambulatory Visit: Payer: Self-pay | Admitting: Women's Health

## 2018-04-16 ENCOUNTER — Telehealth: Payer: Self-pay | Admitting: *Deleted

## 2018-04-16 LAB — URINE CULTURE

## 2018-04-16 MED ORDER — CEPHALEXIN 500 MG PO CAPS: 500.0000 mg | ORAL_CAPSULE | Freq: Four times a day (QID) | ORAL | 0 refills | Status: DC

## 2018-04-16 NOTE — Telephone Encounter (Signed)
LM for pt to return call. Urine cx +GBS, symptomatic, will treat.  Cheral Marker, CNM, [facility] 04/16/2018 3:49 PM

## 2018-04-16 NOTE — Telephone Encounter (Signed)
Spoke w/ pt on phone. Notified her of urine cx positive for GBS, is allergic to pcn-hives, cephalosporins- n/v. Discussed trying keflex w/ food, if does cause n/v let us know.  Cheral Marker, CNM, [facility] 04/16/2018 4:31 PM

## 2018-04-17 LAB — NUSWAB VAGINITIS PLUS (VG+)
CANDIDA ALBICANS, NAA: NEGATIVE
Candida glabrata, NAA: NEGATIVE
Chlamydia trachomatis, NAA: NEGATIVE
NEISSERIA GONORRHOEAE, NAA: NEGATIVE
Trich vag by NAA: NEGATIVE

## 2018-04-20 ENCOUNTER — Telehealth: Payer: Self-pay | Admitting: Women's Health

## 2018-04-21 ENCOUNTER — Other Ambulatory Visit: Payer: Self-pay | Admitting: Women's Health

## 2018-04-21 MED ORDER — CLINDAMYCIN HCL 300 MG PO CAPS: 300.0000 mg | ORAL_CAPSULE | Freq: Three times a day (TID) | ORAL | 0 refills | Status: DC

## 2018-04-21 NOTE — Telephone Encounter (Signed)
LMOVM that clindamycin was sent.

## 2018-06-23 ENCOUNTER — Other Ambulatory Visit: Payer: Self-pay | Admitting: Adult Health

## 2018-08-25 ENCOUNTER — Ambulatory Visit: Payer: Medicaid Other | Admitting: Advanced Practice Midwife

## 2018-08-25 ENCOUNTER — Encounter (INDEPENDENT_AMBULATORY_CARE_PROVIDER_SITE_OTHER): Payer: Self-pay

## 2018-08-25 ENCOUNTER — Encounter: Payer: Self-pay | Admitting: Advanced Practice Midwife

## 2018-08-25 VITALS — BP 117/81 | HR 89 | Ht 60.0 in | Wt 145.0 lb

## 2018-08-25 DIAGNOSIS — N939 Abnormal uterine and vaginal bleeding, unspecified: Secondary | ICD-10-CM

## 2018-08-25 DIAGNOSIS — Z3202 Encounter for pregnancy test, result negative: Secondary | ICD-10-CM

## 2018-08-25 DIAGNOSIS — R102 Pelvic and perineal pain: Secondary | ICD-10-CM

## 2018-08-25 LAB — POCT URINALYSIS DIPSTICK
GLUCOSE UA: NEGATIVE
Ketones, UA: NEGATIVE
LEUKOCYTES UA: NEGATIVE
Nitrite, UA: NEGATIVE
Protein, UA: NEGATIVE
RBC UA: NEGATIVE

## 2018-08-25 LAB — POCT URINE PREGNANCY: PREG TEST UR: NEGATIVE

## 2018-08-25 MED ORDER — METRONIDAZOLE 1 % EX GEL: Freq: Every day | CUTANEOUS | 0 refills | Status: DC

## 2018-08-25 MED ORDER — PNV PRENATAL PLUS MULTIVITAMIN 27-1 MG PO TABS: 1.0000 | ORAL_TABLET | Freq: Every day | ORAL | 11 refills | Status: DC

## 2018-08-25 NOTE — Progress Notes (Signed)
[facility] Visit  Patient name: Rebecca [name] MRN [id number]  Date of birth: December 30, 1985  CC & HPI:  Rebecca [name] is a 33 y.o. Caucasian female presenting today for what she thought was a pap, not needed, so changed to a gyn visit. Bled a few days early this mo nth (9/18, normslly on yhr 21st) . For 2 days (normally bleeds 4 days) . Then a few days ago,  had some bright red bleeding after sex that lasted about an hour.  Also has some vaginal/pelvic/lower back pressure. Not on [facility]  Pertinent History Reviewed:  Medical & Surgical Hx:   Past Medical History:  Diagnosis Date  . Anxiety   . Back pain   . Boil 05/14/2016  . Breast pain 05/11/2015  . Contraceptive management 08/30/2013  . GERD (gastroesophageal reflux disease)   . LLQ pain 11/04/2014  . OAB (overactive bladder) 04/20/2013  . Reflux   . Smoker 05/14/2016  . Soreness breast 10/25/2013   Left breast sore UOQ, no masses  . Urinary frequency 11/04/2014   History reviewed. No pertinent surgical history. Family History  Problem Relation Age of Onset  . Cancer Maternal Grandmother   . Cancer Maternal Grandfather   . CAD Father   . Stroke Father   . Liver disease Father   . Alcohol abuse Father   . Other Mother        brain tumor  . Asthma Son   . Bronchitis Son   . ADD / ADHD Son     Current Outpatient Medications:  .  omeprazole (PRILOSEC) 20 MG capsule, TAKE 1 CAPSULE BY MOUTH EVERY DAY AS NEEDED, Disp: 30 capsule, Rfl: 3 .  simvastatin (ZOCOR) 20 MG tablet, TAKE 1 TABLET(20 MG) BY MOUTH DAILY, Disp: 30 tablet, Rfl: 0 .  metroNIDAZOLE (METROGEL) 1 % gel, Apply topically daily., Disp: 45 g, Rfl: 0 .  Prenatal Vit-Fe Fumarate-FA (PNV PRENATAL PLUS MULTIVITAMIN) 27-1 MG TABS, Take 1 tablet by mouth daily., Disp: 30 tablet, Rfl: 11 Social History: Reviewed -  reports that she has been smoking cigarettes. She has a 7.50 pack-year smoking history. She has never used smokeless tobacco.  Review of Systems:    Constitutional: Negative for fever and chills Eyes: Negative for visual disturbances Respiratory: Negative for shortness of breath, dyspnea Cardiovascular: Negative for chest pain or palpitations  Gastrointestinal: Negative for vomiting, diarrhea and constipation; no abdominal pain Genitourinary: Negative for dysuria and urgency, vaginal irritation or itching Musculoskeletal: Negative for back pain, joint pain, myalgias  Neurological: Negative for dizziness and headaches    Objective Findings:    Physical Examination: Vitals:   08/25/18 1540  BP: 117/81  Pulse: 89   General appearance - well appearing, and in no distress Mental status - alert, oriented to person, place, and time Chest:  Normal respiratory effort Heart - normal rate and regular rhythm Abdomen:  Soft, nontender Pelvic: SSE:  Discharge scant, looks normal.  cx sl friable. Nuswab collected. Wet prep neg Musculoskeletal:  Normal range of motion without pain Extremities:  No edema    Results for orders placed or performed in visit on 08/25/18 (from the past 24 hour(s))  POCT urine pregnancy   Collection Time: 08/25/18  4:35 PM  Result Value Ref Range   Preg Test, Ur Negative Negative  POCT Urinalysis Dipstick   Collection Time: 08/25/18  4:36 PM  Result Value Ref Range   Color, UA     Clarity, UA  Glucose, UA Negative Negative   Bilirubin, UA     Ketones, UA neg    Spec Grav, UA     Blood, UA neg    pH, UA     Protein, UA Negative Negative   Urobilinogen, UA     Nitrite, UA neg    Leukocytes, UA Negative Negative   Appearance     Odor        Assessment & Plan:  A:   Normal DC w/sl friable cx, ? Anovulatory cycle P:  Metrogel  PNV   No follow-ups on file.  Rebecca [name] CNM 08/26/2018 2:09 PM

## 2018-08-26 NOTE — Progress Notes (Signed)
error 

## 2018-08-27 ENCOUNTER — Other Ambulatory Visit: Payer: Self-pay | Admitting: Adult Health

## 2018-08-27 ENCOUNTER — Telehealth: Payer: Self-pay | Admitting: Advanced Practice Midwife

## 2018-08-27 MED ORDER — METRONIDAZOLE 0.75 % VA GEL: 1.0000 | Freq: Every day | VAGINAL | 0 refills | Status: AC

## 2018-08-27 NOTE — Addendum Note (Signed)
Addended by: Jacklyn Shell on: 08/27/2018 04:50 PM   Modules accepted: Orders

## 2018-08-27 NOTE — Telephone Encounter (Signed)
done

## 2018-08-27 NOTE — Telephone Encounter (Signed)
Pt needs metrogel changed to pill form. Insurance not covering it.

## 2018-08-27 NOTE — Telephone Encounter (Signed)
Pt called and took script to pharmacy and her Ins does not cover told pt she needed to call us to get something else, also need refill on Cholesterol meds , and they told her to call us for refill

## 2018-08-29 LAB — NUSWAB VAGINITIS PLUS (VG+)
Candida albicans, NAA: NEGATIVE
Candida glabrata, NAA: NEGATIVE
Chlamydia trachomatis, NAA: NEGATIVE
Neisseria gonorrhoeae, NAA: NEGATIVE
Trich vag by NAA: NEGATIVE

## 2018-09-30 ENCOUNTER — Other Ambulatory Visit: Payer: Self-pay | Admitting: Adult Health

## 2018-10-09 ENCOUNTER — Telehealth: Payer: Self-pay | Admitting: *Deleted

## 2018-10-09 DIAGNOSIS — N3001 Acute cystitis with hematuria: Secondary | ICD-10-CM | POA: Diagnosis not present

## 2018-10-09 DIAGNOSIS — R110 Nausea: Secondary | ICD-10-CM | POA: Diagnosis not present

## 2018-10-09 DIAGNOSIS — N898 Other specified noninflammatory disorders of vagina: Secondary | ICD-10-CM | POA: Diagnosis not present

## 2018-10-09 DIAGNOSIS — R300 Dysuria: Secondary | ICD-10-CM | POA: Diagnosis not present

## 2018-10-09 NOTE — Telephone Encounter (Signed)
Spoke with pt. Pt is pregnant, maybe around 6-8 weeks. Was seen at Urgent care. Pt has UTI. Was prescribed Cephalexin 500 mg, Zofran 8 mg, and Pyridium 200 mg. Pt has appt here 11/25. Pt is also on Simvastatin and Omeprazole. Spoke with Selena Batten, CNM who advised not to take Zofran or Simvastatin. Pt voiced understanding. JSY

## 2018-10-26 ENCOUNTER — Other Ambulatory Visit: Payer: Self-pay

## 2018-10-26 ENCOUNTER — Encounter: Payer: Self-pay | Admitting: Adult Health

## 2018-10-26 ENCOUNTER — Ambulatory Visit: Payer: Medicaid Other | Admitting: Adult Health

## 2018-10-26 VITALS — BP 122/76 | HR 75 | Ht 60.0 in | Wt 133.0 lb

## 2018-10-26 DIAGNOSIS — Z3201 Encounter for pregnancy test, result positive: Secondary | ICD-10-CM | POA: Diagnosis not present

## 2018-10-26 DIAGNOSIS — Z3A01 Less than 8 weeks gestation of pregnancy: Secondary | ICD-10-CM

## 2018-10-26 DIAGNOSIS — O3680X0 Pregnancy with inconclusive fetal viability, not applicable or unspecified: Secondary | ICD-10-CM

## 2018-10-26 DIAGNOSIS — N926 Irregular menstruation, unspecified: Secondary | ICD-10-CM

## 2018-10-26 LAB — POCT URINE PREGNANCY: Preg Test, Ur: POSITIVE — AB

## 2018-10-26 NOTE — Patient Instructions (Signed)
First Trimester of Pregnancy The first trimester of pregnancy is from week 1 until the end of week 13 (months 1 through 3). A week after a sperm fertilizes an egg, the egg will implant on the wall of the uterus. This embryo will begin to develop into a baby. Genes from you and your partner will form the baby. The female genes will determine whether the baby will be a boy or a girl. At 6-8 weeks, the eyes and face will be formed, and the heartbeat can be seen on ultrasound. At the end of 12 weeks, all the baby's organs will be formed. Now that you are pregnant, you will want to do everything you can to have a healthy baby. Two of the most important things are to get good prenatal care and to follow your health care provider's instructions. Prenatal care is all the medical care you receive before the baby's birth. This care will help prevent, find, and treat any problems during the pregnancy and childbirth. Body changes during your first trimester Your body goes through many changes during pregnancy. The changes vary from woman to woman.  You may gain or lose a couple of pounds at first.  You may feel sick to your stomach (nauseous) and you may throw up (vomit). If the vomiting is uncontrollable, call your health care provider.  You may tire easily.  You may develop headaches that can be relieved by medicines. All medicines should be approved by your health care provider.  You may urinate more often. Painful urination may mean you have a bladder infection.  You may develop heartburn as a result of your pregnancy.  You may develop constipation because certain hormones are causing the muscles that push stool through your intestines to slow down.  You may develop hemorrhoids or swollen veins (varicose veins).  Your breasts may begin to grow larger and become tender. Your nipples may stick out more, and the tissue that surrounds them (areola) may become darker.  Your gums may bleed and may be  sensitive to brushing and flossing.  Dark spots or blotches (chloasma, mask of pregnancy) may develop on your face. This will likely fade after the baby is born.  Your menstrual periods will stop.  You may have a loss of appetite.  You may develop cravings for certain kinds of food.  You may have changes in your emotions from day to day, such as being excited to be pregnant or being concerned that something may go wrong with the pregnancy and baby.  You may have more vivid and strange dreams.  You may have changes in your hair. These can include thickening of your hair, rapid growth, and changes in texture. Some women also have hair loss during or after pregnancy, or hair that feels dry or thin. Your hair will most likely return to normal after your baby is born.  What to expect at prenatal visits During a routine prenatal visit:  You will be weighed to make sure you and the baby are growing normally.  Your blood pressure will be taken.  Your abdomen will be measured to track your baby's growth.  The fetal heartbeat will be listened to between weeks 10 and 14 of your pregnancy.  Test results from any previous visits will be discussed.  Your health care provider may ask you:  How you are feeling.  If you are feeling the baby move.  If you have had any abnormal symptoms, such as leaking fluid, bleeding, severe headaches,   or abdominal cramping.  If you are using any tobacco products, including cigarettes, chewing tobacco, and electronic cigarettes.  If you have any questions.  Other tests that may be performed during your first trimester include:  Blood tests to find your blood type and to check for the presence of any previous infections. The tests will also be used to check for low iron levels (anemia) and protein on red blood cells (Rh antibodies). Depending on your risk factors, or if you previously had diabetes during pregnancy, you may have tests to check for high blood  sugar that affects pregnant women (gestational diabetes).  Urine tests to check for infections, diabetes, or protein in the urine.  An ultrasound to confirm the proper growth and development of the baby.  Fetal screens for spinal cord problems (spina bifida) and Down syndrome.  HIV (human immunodeficiency virus) testing. Routine prenatal testing includes screening for HIV, unless you choose not to have this test.  You may need other tests to make sure you and the baby are doing well.  Follow these instructions at home: Medicines  Follow your health care provider's instructions regarding medicine use. Specific medicines may be either safe or unsafe to take during pregnancy.  Take a prenatal vitamin that contains at least 600 micrograms (mcg) of folic acid.  If you develop constipation, try taking a stool softener if your health care provider approves. Eating and drinking  Eat a balanced diet that includes fresh fruits and vegetables, whole grains, good sources of protein such as meat, eggs, or tofu, and low-fat dairy. Your health care provider will help you determine the amount of weight gain that is right for you.  Avoid raw meat and uncooked cheese. These carry germs that can cause birth defects in the baby.  Eating four or five small meals rather than three large meals a day may help relieve nausea and vomiting. If you start to feel nauseous, eating a few soda crackers can be helpful. Drinking liquids between meals, instead of during meals, also seems to help ease nausea and vomiting.  Limit foods that are high in fat and processed sugars, such as fried and sweet foods.  To prevent constipation: ? Eat foods that are high in fiber, such as fresh fruits and vegetables, whole grains, and beans. ? Drink enough fluid to keep your urine clear or pale yellow. Activity  Exercise only as directed by your health care provider. Most women can continue their usual exercise routine during  pregnancy. Try to exercise for 30 minutes at least 5 days a week. Exercising will help you: ? Control your weight. ? Stay in shape. ? Be prepared for labor and delivery.  Experiencing pain or cramping in the lower abdomen or lower back is a good sign that you should stop exercising. Check with your health care provider before continuing with normal exercises.  Try to avoid standing for long periods of time. Move your legs often if you must stand in one place for a long time.  Avoid heavy lifting.  Wear low-heeled shoes and practice good posture.  You may continue to have sex unless your health care provider tells you not to. Relieving pain and discomfort  Wear a good support bra to relieve breast tenderness.  Take warm sitz baths to soothe any pain or discomfort caused by hemorrhoids. Use hemorrhoid cream if your health care provider approves.  Rest with your legs elevated if you have leg cramps or low back pain.  If you develop   varicose veins in your legs, wear support hose. Elevate your feet for 15 minutes, 3-4 times a day. Limit salt in your diet. Prenatal care  Schedule your prenatal visits by the twelfth week of pregnancy. They are usually scheduled monthly at first, then more often in the last 2 months before delivery.  Write down your questions. Take them to your prenatal visits.  Keep all your prenatal visits as told by your health care provider. This is important. Safety  Wear your seat belt at all times when driving.  Make a list of emergency phone numbers, including numbers for family, friends, the hospital, and police and fire departments. General instructions  Ask your health care provider for a referral to a local prenatal education class. Begin classes no later than the beginning of month 6 of your pregnancy.  Ask for help if you have counseling or nutritional needs during pregnancy. Your health care provider can offer advice or refer you to specialists for help  with various needs.  Do not use hot tubs, steam rooms, or saunas.  Do not douche or use tampons or scented sanitary pads.  Do not cross your legs for long periods of time.  Avoid cat litter boxes and soil used by cats. These carry germs that can cause birth defects in the baby and possibly loss of the fetus by miscarriage or stillbirth.  Avoid all smoking, herbs, alcohol, and medicines not prescribed by your health care provider. Chemicals in these products affect the formation and growth of the baby.  Do not use any products that contain nicotine or tobacco, such as cigarettes and e-cigarettes. If you need help quitting, ask your health care provider. You may receive counseling support and other resources to help you quit.  Schedule a dentist appointment. At home, brush your teeth with a soft toothbrush and be gentle when you floss. Contact a health care provider if:  You have dizziness.  You have mild pelvic cramps, pelvic pressure, or nagging pain in the abdominal area.  You have persistent nausea, vomiting, or diarrhea.  You have a bad smelling vaginal discharge.  You have pain when you urinate.  You notice increased swelling in your face, hands, legs, or ankles.  You are exposed to fifth disease or chickenpox.  You are exposed to German measles (rubella) and have never had it. Get help right away if:  You have a fever.  You are leaking fluid from your vagina.  You have spotting or bleeding from your vagina.  You have severe abdominal cramping or pain.  You have rapid weight gain or loss.  You vomit blood or material that looks like coffee grounds.  You develop a severe headache.  You have shortness of breath.  You have any kind of trauma, such as from a fall or a car accident. Summary  The first trimester of pregnancy is from week 1 until the end of week 13 (months 1 through 3).  Your body goes through many changes during pregnancy. The changes vary from  woman to woman.  You will have routine prenatal visits. During those visits, your health care provider will examine you, discuss any test results you may have, and talk with you about how you are feeling. This information is not intended to replace advice given to you by your health care provider. Make sure you discuss any questions you have with your health care provider. Document Released: 11/12/2001 Document Revised: 10/30/2016 Document Reviewed: 10/30/2016 Elsevier Interactive Patient Education  2018 [employer]  

## 2018-10-26 NOTE — Progress Notes (Signed)
  Subjective:     Patient ID: Rebecca [name], female   DOB: [birth year], 32 y.o.   MRN: [id number]  HPI Rebecca [name] is a 32 year old white female, separated in for UPT, has missed a period and had 5+HPTs. She was on Zocor, xanax and hydrocodone and has weaned off, hydrocodone  and xanax and stopped Zocor. She works at [employer].  PCP is Dr Sherwood GamblerFusco.   Review of Systems +missed period with 5 +HPTs Not sleeping well since stopping xanax and hydrocodone  Feels tingling in pelvic area Reviewed past medical,surgical, social and family history. Reviewed medications and allergies.     Objective:   Physical Exam BP 122/76 (BP Location: Right Arm, Patient Position: Sitting, Cuff Size: Normal)   Pulse 75   Ht 5' (1.524 m)   Wt 133 lb (60.3 kg)   LMP 09/09/2018   BMI 25.97 kg/m UPT+, about 6-5 weeks by LMP with EDD 06/16/19.Skin warm and dry. Neck: mid line trachea, normal thyroid, good ROM, no lymphadenopathy noted. Lungs: clear to ausculation bilaterally. Cardiovascular: regular rate and rhythm.Abdomen is soft and non tender. She [facility] and ab[facility] after hours call service.     Assessment:     1. Positive pregnancy test   2. Less than [redacted] weeks gestation of pregnancy   3. Encounter to determine fetal viability of pregnancy, single or unspecified fetus       Plan:    Return in 2 weeks for dating US and and 4 weeks for new OB Decrease smoking Ok to take benadryl and unisom for sleep Review handout on First trimester and by [facility]

## 2018-11-02 ENCOUNTER — Ambulatory Visit (INDEPENDENT_AMBULATORY_CARE_PROVIDER_SITE_OTHER): Payer: Medicaid Other

## 2018-11-02 DIAGNOSIS — O3680X0 Pregnancy with inconclusive fetal viability, not applicable or unspecified: Secondary | ICD-10-CM

## 2018-11-02 NOTE — Progress Notes (Signed)
US 7+5 wks,single IUP w/ ys,positive fht 145 bpm,normal ovaries bilat,crl 12.12 mm

## 2018-11-09 ENCOUNTER — Ambulatory Visit: Payer: Medicaid Other | Admitting: *Deleted

## 2018-11-09 ENCOUNTER — Encounter: Payer: Self-pay | Admitting: Women's Health

## 2018-11-09 ENCOUNTER — Ambulatory Visit (INDEPENDENT_AMBULATORY_CARE_PROVIDER_SITE_OTHER): Payer: Medicaid Other | Admitting: Women's Health

## 2018-11-09 ENCOUNTER — Other Ambulatory Visit: Payer: Self-pay | Admitting: Women's Health

## 2018-11-09 VITALS — BP 114/72 | HR 80 | Wt 135.0 lb

## 2018-11-09 DIAGNOSIS — Z331 Pregnant state, incidental: Secondary | ICD-10-CM

## 2018-11-09 DIAGNOSIS — O99331 Smoking (tobacco) complicating pregnancy, first trimester: Secondary | ICD-10-CM

## 2018-11-09 DIAGNOSIS — F418 Other specified anxiety disorders: Secondary | ICD-10-CM

## 2018-11-09 DIAGNOSIS — F17200 Nicotine dependence, unspecified, uncomplicated: Secondary | ICD-10-CM | POA: Diagnosis not present

## 2018-11-09 DIAGNOSIS — O09211 Supervision of pregnancy with history of pre-term labor, first trimester: Secondary | ICD-10-CM

## 2018-11-09 DIAGNOSIS — Z1389 Encounter for screening for other disorder: Secondary | ICD-10-CM

## 2018-11-09 DIAGNOSIS — Z3682 Encounter for antenatal screening for nuchal translucency: Secondary | ICD-10-CM

## 2018-11-09 DIAGNOSIS — O26891 Other specified pregnancy related conditions, first trimester: Secondary | ICD-10-CM

## 2018-11-09 DIAGNOSIS — Z3A08 8 weeks gestation of pregnancy: Secondary | ICD-10-CM

## 2018-11-09 DIAGNOSIS — N898 Other specified noninflammatory disorders of vagina: Secondary | ICD-10-CM | POA: Diagnosis not present

## 2018-11-09 DIAGNOSIS — Z3481 Encounter for supervision of other normal pregnancy, first trimester: Secondary | ICD-10-CM

## 2018-11-09 DIAGNOSIS — O09899 Supervision of other high risk pregnancies, unspecified trimester: Secondary | ICD-10-CM | POA: Insufficient documentation

## 2018-11-09 DIAGNOSIS — Z3490 Encounter for supervision of normal pregnancy, unspecified, unspecified trimester: Secondary | ICD-10-CM | POA: Insufficient documentation

## 2018-11-09 DIAGNOSIS — O09219 Supervision of pregnancy with history of pre-term labor, unspecified trimester: Secondary | ICD-10-CM

## 2018-11-09 LAB — POCT URINALYSIS DIPSTICK OB
Blood, UA: NEGATIVE
Glucose, UA: NEGATIVE
Ketones, UA: NEGATIVE
LEUKOCYTES UA: NEGATIVE
Nitrite, UA: NEGATIVE
POC,PROTEIN,UA: NEGATIVE

## 2018-11-09 LAB — POCT WET PREP (WET MOUNT)
CLUE CELLS WET PREP WHIFF POC: NEGATIVE
Trichomonas Wet Prep HPF POC: ABSENT

## 2018-11-09 MED ORDER — ESCITALOPRAM OXALATE 10 MG PO TABS: 10.0000 mg | ORAL_TABLET | Freq: Every day | ORAL | 6 refills | Status: DC

## 2018-11-09 MED ORDER — DOXYLAMINE-PYRIDOXINE 10-10 MG PO TBEC: DELAYED_RELEASE_TABLET | ORAL | 6 refills | Status: DC

## 2018-11-09 NOTE — Patient Instructions (Signed)
Doran StablerShirley C Goodman, I greatly value your feedback.  If you receive a survey following your visit with us today, we appreciate you taking the time to fill it out.  Thanks, Joellyn HaffKim Booker, CNM, WHNP-BC   Nausea & Vomiting  Have saltine crackers or pretzels by your bed and eat a few bites before you raise your head out of bed in the morning  Eat small frequent meals throughout the day instead of large meals  Drink plenty of fluids throughout the day to stay hydrated, just don't drink a lot of fluids with your meals.  This can make your stomach fill up faster making you feel sick  Do not brush your teeth right after you eat  Products with real ginger are good for nausea, like ginger ale and ginger hard candy Make sure it says made with real ginger!  Sucking on sour candy like lemon heads is also good for nausea  If your prenatal vitamins make you nauseated, take them at night so you will sleep through the nausea  Sea Bands  If you feel like you need medicine for the nausea & vomiting please let us know  If you are unable to keep any fluids or food down please let us know   Constipation  Drink plenty of fluid, preferably water, throughout the day  Eat foods high in fiber such as fruits, vegetables, and grains  Exercise, such as walking, is a good way to keep your bowels regular  Drink warm fluids, especially warm prune juice, or decaf coffee  Eat a 1/2 cup of real oatmeal (not instant), 1/2 cup applesauce, and 1/2-1 cup warm prune juice every day  If needed, you may take Colace (docusate sodium) stool softener once or twice a day to help keep the stool soft. If you are pregnant, wait until you are out of your first trimester (12-14 weeks of pregnancy)  If you still are having problems with constipation, you may take Miralax once daily as needed to help keep your bowels regular.  If you are pregnant, wait until you are out of your first trimester (12-14 weeks of pregnancy)  Tips to  Help You Sleep Better:   Get into a bedtime routine, try to do the same thing every night before going to bed to try to help your body wind down  Warm baths  Avoid caffeine for at least 3 hours before going to sleep   Keep your room at a slightly cooler temperature, can try running a fan  Turn off TV, lights, phone, electronics  Lots of pillows if needed to help you get comfortable  Lavender scented items can help you sleep. You can place lavender essential oil on a cotton ball and place under your pillowcase, or place in a diffuser. [employer] has a lavender scented sleep line (plug-ins, sprays, etc). Look in the pillow aisle for lavender scented pillows.   If none of the above things help, you can try 1/2 to 1 tablet of benadryl, unisom, or tylenol pm. Do not take this every night, only when you really need it.      First Trimester of Pregnancy The first trimester of pregnancy is from week 1 until the end of week 12 (months 1 through 3). A week after a sperm fertilizes an egg, the egg will implant on the wall of the uterus. This embryo will begin to develop into a baby. Genes from you and your partner are forming the baby. The female genes determine whether the  baby is a boy or a girl. At 6-8 weeks, the eyes and face are formed, and the heartbeat can be seen on ultrasound. At the end of 12 weeks, all the baby's organs are formed.  Now that you are pregnant, you will want to do everything you can to have a healthy baby. Two of the most important things are to get good prenatal care and to follow your health care provider's instructions. Prenatal care is all the medical care you receive before the baby's birth. This care will help prevent, find, and treat any problems during the pregnancy and childbirth. BODY CHANGES Your body goes through many changes during pregnancy. The changes vary from woman to woman.   You may gain or lose a couple of pounds at first.  You may feel sick to your stomach  (nauseous) and throw up (vomit). If the vomiting is uncontrollable, call your health care provider.  You may tire easily.  You may develop headaches that can be relieved by medicines approved by your health care provider.  You may urinate more often. Painful urination may mean you have a bladder infection.  You may develop heartburn as a result of your pregnancy.  You may develop constipation because certain hormones are causing the muscles that push waste through your intestines to slow down.  You may develop hemorrhoids or swollen, bulging veins (varicose veins).  Your breasts may begin to grow larger and become tender. Your nipples may stick out more, and the tissue that surrounds them (areola) may become darker.  Your gums may bleed and may be sensitive to brushing and flossing.  Dark spots or blotches (chloasma, mask of pregnancy) may develop on your face. This will likely fade after the baby is born.  Your menstrual periods will stop.  You may have a loss of appetite.  You may develop cravings for certain kinds of food.  You may have changes in your emotions from day to day, such as being excited to be pregnant or being concerned that something may go wrong with the pregnancy and baby.  You may have more vivid and strange dreams.  You may have changes in your hair. These can include thickening of your hair, rapid growth, and changes in texture. Some women also have hair loss during or after pregnancy, or hair that feels dry or thin. Your hair will most likely return to normal after your baby is born. WHAT TO EXPECT AT YOUR PRENATAL VISITS During a routine prenatal visit:  You will be weighed to make sure you and the baby are growing normally.  Your blood pressure will be taken.  Your abdomen will be measured to track your baby's growth.  The fetal heartbeat will be listened to starting around week 10 or 12 of your pregnancy.  Test results from any previous visits will  be discussed. Your health care provider may ask you:  How you are feeling.  If you are feeling the baby move.  If you have had any abnormal symptoms, such as leaking fluid, bleeding, severe headaches, or abdominal cramping.  If you have any questions. Other tests that may be performed during your first trimester include:  Blood tests to find your blood type and to check for the presence of any previous infections. They will also be used to check for low iron levels (anemia) and Rh antibodies. Later in the pregnancy, blood tests for diabetes will be done along with other tests if problems develop.  Urine tests to check  for infections, diabetes, or protein in the urine.  An ultrasound to confirm the proper growth and development of the baby.  An amniocentesis to check for possible genetic problems.  Fetal screens for spina bifida and Down syndrome.  You may need other tests to make sure you and the baby are doing well. HOME CARE INSTRUCTIONS  Medicines  Follow your health care provider's instructions regarding medicine use. Specific medicines may be either safe or unsafe to take during pregnancy.  Take your prenatal vitamins as directed.  If you develop constipation, try taking a stool softener if your health care provider approves. Diet  Eat regular, well-balanced meals. Choose a variety of foods, such as meat or vegetable-based protein, fish, milk and low-fat dairy products, vegetables, fruits, and whole grain breads and cereals. Your health care provider will help you determine the amount of weight gain that is right for you.  Avoid raw meat and uncooked cheese. These carry germs that can cause birth defects in the baby.  Eating four or five small meals rather than three large meals a day may help relieve nausea and vomiting. If you start to feel nauseous, eating a few soda crackers can be helpful. Drinking liquids between meals instead of during meals also seems to help nausea  and vomiting.  If you develop constipation, eat more high-fiber foods, such as fresh vegetables or fruit and whole grains. Drink enough fluids to keep your urine clear or pale yellow. Activity and Exercise  Exercise only as directed by your health care provider. Exercising will help you:  Control your weight.  Stay in shape.  Be prepared for labor and delivery.  Experiencing pain or cramping in the lower abdomen or low back is a good sign that you should stop exercising. Check with your health care provider before continuing normal exercises.  Try to avoid standing for long periods of time. Move your legs often if you must stand in one place for a long time.  Avoid heavy lifting.  Wear low-heeled shoes, and practice good posture.  You may continue to have sex unless your health care provider directs you otherwise. Relief of Pain or Discomfort  Wear a good support bra for breast tenderness.   Take warm sitz baths to soothe any pain or discomfort caused by hemorrhoids. Use hemorrhoid cream if your health care provider approves.   Rest with your legs elevated if you have leg cramps or low back pain.  If you develop varicose veins in your legs, wear support hose. Elevate your feet for 15 minutes, 3-4 times a day. Limit salt in your diet. Prenatal Care  Schedule your prenatal visits by the twelfth week of pregnancy. They are usually scheduled monthly at first, then more often in the last 2 months before delivery.  Write down your questions. Take them to your prenatal visits.  Keep all your prenatal visits as directed by your health care provider. Safety  Wear your seat belt at all times when driving.  Make a list of emergency phone numbers, including numbers for family, friends, the hospital, and police and fire departments. General Tips  Ask your health care provider for a referral to a local prenatal education class. Begin classes no later than at the beginning of month 6  of your pregnancy.  Ask for help if you have counseling or nutritional needs during pregnancy. Your health care provider can offer advice or refer you to specialists for help with various needs.  Do not use hot tubs,  steam rooms, or saunas.  Do not douche or use tampons or scented sanitary pads.  Do not cross your legs for long periods of time.  Avoid cat litter boxes and soil used by cats. These carry germs that can cause birth defects in the baby and possibly loss of the fetus by miscarriage or stillbirth.  Avoid all smoking, herbs, alcohol, and medicines not prescribed by your health care provider. Chemicals in these affect the formation and growth of the baby.  Schedule a dentist appointment. At home, brush your teeth with a soft toothbrush and be gentle when you floss. SEEK MEDICAL CARE IF:   You have dizziness.  You have mild pelvic cramps, pelvic pressure, or nagging pain in the abdominal area.  You have persistent nausea, vomiting, or diarrhea.  You have a bad smelling vaginal discharge.  You have pain with urination.  You notice increased swelling in your face, hands, legs, or ankles. SEEK IMMEDIATE MEDICAL CARE IF:   You have a fever.  You are leaking fluid from your vagina.  You have spotting or bleeding from your vagina.  You have severe abdominal cramping or pain.  You have rapid weight gain or loss.  You vomit blood or material that looks like coffee grounds.  You are exposed to [place] measles and have never had them.  You are exposed to fifth disease or chickenpox.  You develop a severe headache.  You have shortness of breath.  You have any kind of trauma, such as from a fall or a car accident. Document Released: 11/12/2001 Document Revised: 04/04/2014 Document Reviewed: 09/28/2013 [facility] Patient Information 2015 [place], [place]. This information is not intended to replace advice given to you by your health care provider. Make sure you discuss any  questions you have with your health care provider.

## 2018-11-09 NOTE — Progress Notes (Signed)
INITIAL OBSTETRICAL VISIT Patient name: Rebecca [name] MRN [id number]  Date of birth: Sep 27, 1986 Chief Complaint:   Initial Prenatal Visit (trouble sleeping)  History of Present Illness:   Rebecca [name] is a 32 y.o. 603P1102 Caucasian female at 5559w5d by LMP c/w 7wk u/s, with an Estimated Date of Delivery: 06/16/19 being seen today for her initial obstetrical visit.   Her obstetrical history is significant for 36wk PTB d/t PPROM, then term SVB.   Today she reports nausea- requests meds. Trouble sleeping. Vaginal d/c, no itching/odor/irritation, occ has odor. Smokes 1ppd.  Dep/anx- mainly anxiety, was on xanax in past, has been off for about 123yr, does not feel she is doing well being off meds, would like to get back on something. Denies SI/HI.  Patient's last menstrual period was 09/09/2018. Last pap 07/07/17. Results were: normal Review of Systems:   Pertinent items are noted in HPI Denies cramping/contractions, leakage of fluid, vaginal bleeding, abnormal vaginal discharge w/ itching/odor/irritation, headaches, visual changes, shortness of breath, chest pain, abdominal pain, severe nausea/vomiting, or problems with urination or bowel movements unless otherwise stated above.  Pertinent History Reviewed:  Reviewed past medical,surgical, social, obstetrical and family history.  Reviewed problem list, medications and allergies. OB History  Gravida Para Term Preterm AB Living  3 2 1 1   2   SAB TAB Ectopic Multiple Live Births          2    # Outcome Date GA Lbr Len/2nd Weight Sex Delivery Anes PTL Lv  3 Current           2 Term 04/25/07 7878w0d  6 lb 11 oz (3.033 kg) M Vag-Spont Other Y LIV  1 Preterm 12/19/00 2819w0d  4 lb 11 oz (2.126 kg) F Vag-Spont Other N LIV     Complications: Preterm premature rupture of membranes (PPROM) delivered, current hospitalization   Physical Assessment:   Vitals:   11/09/18 0945  BP: 114/72  Pulse: 80  Weight: 135 lb (61.2 kg)  Body mass index is  26.37 kg/m.       Physical Examination:  General appearance - well appearing, and in no distress  Mental status - alert, oriented to person, place, and time  Psych:  She has a normal mood and affect  Skin - warm and dry, normal color, no suspicious lesions noted  Chest - effort normal, all lung fields clear to auscultation bilaterally  Heart - normal rate and regular rhythm  Abdomen - soft, nontender  Extremities:  No swelling or varicosities noted  Pelvic - VULVA: normal appearing vulva with no masses, tenderness or lesions  VAGINA: normal appearing vagina with normal color and normal discharge, no lesions  CERVIX: normal appearing cervix without discharge or lesions, no CMT  Thin prep pap is not done  Fetal Heart Rate (bpm): +u/s via informal transabdominal u/s  Results for orders placed or performed in visit on 11/09/18 (from the past 24 hour(s))  POC Urinalysis Dipstick OB   Collection Time: 11/09/18 10:15 AM  Result Value Ref Range   Color, UA     Clarity, UA     Glucose, UA Negative Negative   Bilirubin, UA     Ketones, UA neg    Spec Grav, UA     Blood, UA neg    pH, UA     POC,PROTEIN,UA Negative Negative, Trace, Small (1+), Moderate (2+), Large (3+), 4+   Urobilinogen, UA     Nitrite, UA neg    Leukocytes, UA  Negative Negative   Appearance     Odor    POCT Wet Prep [facility])   Collection Time: 11/09/18  1:50 PM  Result Value Ref Range   Source Wet Prep POC vaginal    WBC, Wet Prep HPF POC few    Bacteria Wet Prep HPF POC Few Few   BACTERIA WET PREP MORPHOLOGY POC     Clue Cells Wet Prep HPF POC None None   Clue Cells Wet Prep Whiff POC Negative Whiff    Yeast Wet Prep HPF POC None None   KOH Wet Prep POC     Trichomonas Wet Prep HPF POC Absent Absent    Assessment & Plan:  1) Low-Risk Pregnancy G3P1102 at [redacted]w[redacted]d with an Estimated Date of Delivery: 06/16/19   2) Initial OB visit  3) Smoker> Smokes 1pp/day, counseled x 3-71mins, advised cessation, discussed  risks to fetus while pregnant, to infant pp, and to herself. Offered [facility]uitline[facility], accepted, referral sent.     4) H/O 36wk PTB d/t PPROM> discussed Makena vs prometrium, would rather do Makena, will wait til next visit to order/discuss further  5) Trouble sleeping> gave printed prevention/relief measures   6) Vaginal d/c> normal wet prep, check gc/ct  7) Dep/anx> rx Lexapro, understands can take a few weeks to notice improvement  Meds:  Meds ordered this encounter  Medications  . Doxylamine-Pyridoxine (DICLEGIS) 10-10 MG TBEC    Sig: 2 tabs q hs, if sx persist add 1 tab q am on day 3, if sx persist add 1 tab q afternoon on day 4    Dispense:  100 tablet    Refill:  6    Order Specific Question:   Supervising Provider    Answer:   Despina Hidden, LUTHER H [[id number]]  . escitalopram (LEXAPRO) 10 MG tablet    Sig: Take 1 tablet (10 mg total) by mouth daily.    Dispense:  30 tablet    Refill:  6    Order Specific Question:   Supervising Provider    Answer:   Lazaro Arms [[id number]]    Initial labs obtained Continue prenatal vitamins Reviewed n/v relief measures and warning s/s to report Reviewed recommended weight gain based on pre-gravid BMI Encouraged well-balanced diet Genetic Screening discussed Integrated Screen: requested Cystic fibrosis screening discussed requested Ultrasound discussed; fetal survey: requested CCNC completed>spoke w/ Alvino Chapel  Follow-up: Return in about 30 days (around 12/09/2018) for LROB, US:NT+1stIT.   Orders Placed This Encounter  Procedures  . GC/Chlamydia Probe Amp  . Urine Culture  . US Fetal Nuchal Translucency Measurement  . Obstetric Panel, Including HIV  . Urinalysis, Routine w reflex microscopic  . Pain Management Screening Profile (10S)  . Cystic Fibrosis Mutation 97  . POC Urinalysis Dipstick OB  . POCT Wet Prep [facility])    Cheral Marker CNM, [facility] 11/09/2018 1:58 PM

## 2018-11-10 ENCOUNTER — Encounter: Payer: Self-pay | Admitting: Women's Health

## 2018-11-10 DIAGNOSIS — O9989 Other specified diseases and conditions complicating pregnancy, childbirth and the puerperium: Secondary | ICD-10-CM

## 2018-11-10 DIAGNOSIS — Z283 Underimmunization status: Secondary | ICD-10-CM | POA: Insufficient documentation

## 2018-11-10 DIAGNOSIS — O09899 Supervision of other high risk pregnancies, unspecified trimester: Secondary | ICD-10-CM | POA: Insufficient documentation

## 2018-11-10 LAB — PMP SCREEN PROFILE (10S), URINE
Amphetamine Scrn, Ur: NEGATIVE ng/mL
BARBITURATE SCREEN URINE: NEGATIVE ng/mL
BENZODIAZEPINE SCREEN, URINE: NEGATIVE ng/mL
CANNABINOIDS UR QL SCN: NEGATIVE ng/mL
Cocaine (Metab) Scrn, Ur: NEGATIVE ng/mL
Creatinine(Crt), U: 27 mg/dL (ref 20.0–300.0)
Methadone Screen, Urine: NEGATIVE ng/mL
OXYCODONE+OXYMORPHONE UR QL SCN: NEGATIVE ng/mL
Opiate Scrn, Ur: NEGATIVE ng/mL
Ph of Urine: 6.8 (ref 4.5–8.9)
Phencyclidine Qn, Ur: NEGATIVE ng/mL
Propoxyphene Scrn, Ur: NEGATIVE ng/mL

## 2018-11-10 LAB — OBSTETRIC PANEL, INCLUDING HIV
Antibody Screen: NEGATIVE
BASOS ABS: 0.1 10*3/uL (ref 0.0–0.2)
Basos: 0 %
EOS (ABSOLUTE): 0.1 10*3/uL (ref 0.0–0.4)
Eos: 1 %
HIV Screen 4th Generation wRfx: NONREACTIVE
Hematocrit: 39.2 % (ref 34.0–46.6)
Hemoglobin: 13.6 g/dL (ref 11.1–15.9)
Hepatitis B Surface Ag: NEGATIVE
Immature Grans (Abs): 0.1 10*3/uL (ref 0.0–0.1)
Immature Granulocytes: 1 %
Lymphocytes Absolute: 3.4 10*3/uL — ABNORMAL HIGH (ref 0.7–3.1)
Lymphs: 24 %
MCH: 30.2 pg (ref 26.6–33.0)
MCHC: 34.7 g/dL (ref 31.5–35.7)
MCV: 87 fL (ref 79–97)
Monocytes Absolute: 0.7 10*3/uL (ref 0.1–0.9)
Monocytes: 5 %
Neutrophils Absolute: 9.9 10*3/uL — ABNORMAL HIGH (ref 1.4–7.0)
Neutrophils: 69 %
Platelets: 236 10*3/uL (ref 150–450)
RBC: 4.5 x10E6/uL (ref 3.77–5.28)
RDW: 12.6 % (ref 12.3–15.4)
RPR Ser Ql: NONREACTIVE
Rh Factor: POSITIVE
Rubella Antibodies, IGG: <0.9 index — ABNORMAL LOW (ref >0.99)
WBC: 14.2 10*3/uL — ABNORMAL HIGH (ref 3.4–10.8)

## 2018-11-10 LAB — URINALYSIS, ROUTINE W REFLEX MICROSCOPIC
Bilirubin, UA: NEGATIVE
GLUCOSE, UA: NEGATIVE
KETONES UA: NEGATIVE
Leukocytes, UA: NEGATIVE
Nitrite, UA: NEGATIVE
Protein, UA: NEGATIVE
RBC, UA: NEGATIVE
SPEC GRAV UA: 1.005 (ref 1.005–1.030)
Urobilinogen, Ur: 0.2 mg/dL (ref 0.2–1.0)
pH, UA: 7 (ref 5.0–7.5)

## 2018-11-11 LAB — URINE CULTURE

## 2018-11-11 LAB — GC/CHLAMYDIA PROBE AMP
Chlamydia trachomatis, NAA: NEGATIVE
Neisseria gonorrhoeae by PCR: NEGATIVE

## 2018-11-16 ENCOUNTER — Ambulatory Visit: Payer: Medicaid Other | Admitting: *Deleted

## 2018-11-16 LAB — CYSTIC FIBROSIS MUTATION 97: Interpretation: NOT DETECTED

## 2018-11-27 ENCOUNTER — Telehealth: Payer: Self-pay | Admitting: *Deleted

## 2018-11-27 NOTE — Telephone Encounter (Signed)
Patient states she is at work and is having occasional sharp shooting pain in her vaginal area down her leg. No bleeding or discharge is noted.  She has been off work since Monday and today is her first day back.  States she is drinking plenty of water and emptying her bladder frequently.  Advised patient to take breaks frequently and continue to push fluids. If she developed more severe cramping or bleeding was noted, to call us or [facility]  Pt verbalized understanding.

## 2018-12-02 NOTE — L&D Delivery Note (Addendum)
Delivery Note At 2:26 PM a viable female was delivered via Vaginal, Spontaneous (Presentation: ROA). Fluid: AROM, light meconium just prior to delivery.   APGAR: 9, 9; weight pending but appears AGA.  Placenta status: Spontaneous, intact.  Cord: 3 vessels with the following complications: none.    Anesthesia:  None Episiotomy: None Lacerations: 1st degree;Perineal and periurethral Suture Repair: 3.0 vicryl rapide(perineal) 4.0 monocryl (periurethral) Est. Blood Loss (mL):  250cc  Mom to postpartum.  Baby to Couplet care / Skin to Skin.  Gerlene Fee, DO Family Medicine Resident, PGY-1 06/23/2019, 3:26 PM  Attestation: I was present for the entire delivery and assisted with the laceration repairs.  Lambert Mody. Juleen China, DO OB/GYN Fellow

## 2018-12-07 ENCOUNTER — Other Ambulatory Visit: Payer: Self-pay

## 2018-12-07 ENCOUNTER — Ambulatory Visit (INDEPENDENT_AMBULATORY_CARE_PROVIDER_SITE_OTHER): Payer: Medicaid Other | Admitting: Women's Health

## 2018-12-07 ENCOUNTER — Encounter: Payer: Self-pay | Admitting: Women's Health

## 2018-12-07 ENCOUNTER — Other Ambulatory Visit: Payer: Medicaid Other

## 2018-12-07 VITALS — BP 125/82 | HR 96 | Wt 144.0 lb

## 2018-12-07 DIAGNOSIS — O09219 Supervision of pregnancy with history of pre-term labor, unspecified trimester: Secondary | ICD-10-CM

## 2018-12-07 DIAGNOSIS — Z3A12 12 weeks gestation of pregnancy: Secondary | ICD-10-CM

## 2018-12-07 DIAGNOSIS — Z1389 Encounter for screening for other disorder: Secondary | ICD-10-CM

## 2018-12-07 DIAGNOSIS — G2581 Restless legs syndrome: Secondary | ICD-10-CM

## 2018-12-07 DIAGNOSIS — Z3481 Encounter for supervision of other normal pregnancy, first trimester: Secondary | ICD-10-CM

## 2018-12-07 DIAGNOSIS — O09899 Supervision of other high risk pregnancies, unspecified trimester: Secondary | ICD-10-CM

## 2018-12-07 DIAGNOSIS — Z331 Pregnant state, incidental: Secondary | ICD-10-CM

## 2018-12-07 DIAGNOSIS — O09211 Supervision of pregnancy with history of pre-term labor, first trimester: Secondary | ICD-10-CM

## 2018-12-07 LAB — POCT URINALYSIS DIPSTICK OB
Blood, UA: NEGATIVE
Glucose, UA: NEGATIVE
KETONES UA: NEGATIVE
Leukocytes, UA: NEGATIVE
Nitrite, UA: NEGATIVE
POC,PROTEIN,UA: NEGATIVE

## 2018-12-07 LAB — POCT HEMOGLOBIN: Hemoglobin: 13 g/dL (ref 11–14.6)

## 2018-12-07 NOTE — Patient Instructions (Addendum)
Rebecca [name], I greatly value your feedback.  If you receive a survey following your visit with [place] today, we appreciate you taking the time to fill it out.  Thanks, [name], CNM, WHNP-BC  Tips to Help You Sleep Better:   Get into a bedtime routine, try to do the same thing every night before going to bed to try to help your body wind down  Warm baths  Avoid caffeine for at least 3 hours before going to sleep   Keep your room at a slightly cooler temperature, can try running a fan  Turn off TV, lights, phone, electronics  Lots of pillows if needed to help you get comfortable  Lavender scented items can help you sleep. You can place lavender essential oil on a cotton ball and place under your pillowcase, or place in a diffuser. [employer] has a lavender scented sleep line (plug-ins, sprays, etc). Look in the pillow aisle for lavender scented pillows.   If none of the above things help, you can try 1/2 to 1 tablet of benadryl, unisom, or tylenol pm. Do not take this every night, only when you really need it.      Second Trimester of Pregnancy The second trimester is from week 14 through week 27 (months 4 through 6). The second trimester is often a time when you feel your best. Your body has adjusted to being pregnant, and you begin to feel better physically. Usually, morning sickness has lessened or quit completely, you may have more energy, and you may have an increase in appetite. The second trimester is also a time when the fetus is growing rapidly. At the end of the sixth month, the fetus is about 9 inches long and weighs about 1 pounds. You will likely begin to feel the baby move (quickening) between 16 and 20 weeks of pregnancy. Body changes during your second trimester Your body continues to go through many changes during your second trimester. The changes vary from woman to woman.  Your weight will continue to increase. You will notice your lower abdomen bulging out.  You  may begin to get stretch marks on your hips, abdomen, and breasts.  You may develop headaches that can be relieved by medicines. The medicines should be approved by your health care provider.  You may urinate more often because the fetus is pressing on your bladder.  You may develop or continue to have heartburn as a result of your pregnancy.  You may develop constipation because certain hormones are causing the muscles that push waste through your intestines to slow down.  You may develop hemorrhoids or swollen, bulging veins (varicose veins).  You may have back pain. This is caused by: ? Weight gain. ? Pregnancy hormones that are relaxing the joints in your pelvis. ? A shift in weight and the muscles that support your balance.  Your breasts will continue to grow and they will continue to become tender.  Your gums may bleed and may be sensitive to brushing and flossing.  Dark spots or blotches (chloasma, mask of pregnancy) may develop on your face. This will likely fade after the baby is born.  A dark line from your belly button to the pubic area (linea nigra) may appear. This will likely fade after the baby is born.  You may have changes in your hair. These can include thickening of your hair, rapid growth, and changes in texture. Some women also have hair loss during or after pregnancy, or hair that  feels dry or thin. Your hair will most likely return to normal after your baby is born.  What to expect at prenatal visits During a routine prenatal visit:  You will be weighed to make sure you and the fetus are growing normally.  Your blood pressure will be taken.  Your abdomen will be measured to track your baby's growth.  The fetal heartbeat will be listened to.  Any test results from the previous visit will be discussed.  Your health care provider may ask you:  How you are feeling.  If you are feeling the baby move.  If you have had any abnormal symptoms, such as  leaking fluid, bleeding, severe headaches, or abdominal cramping.  If you are using any tobacco products, including cigarettes, chewing tobacco, and electronic cigarettes.  If you have any questions.  Other tests that may be performed during your second trimester include:  Blood tests that check for: ? Low iron levels (anemia). ? High blood sugar that affects pregnant women (gestational diabetes) between 6624 and 28 weeks. ? Rh antibodies. This is to check for a protein on red blood cells (Rh factor).  Urine tests to check for infections, diabetes, or protein in the urine.  An ultrasound to confirm the proper growth and development of the baby.  An amniocentesis to check for possible genetic problems.  Fetal screens for spina bifida and Down syndrome.  HIV (human immunodeficiency virus) testing. Routine prenatal testing includes screening for HIV, unless you choose not to have this test.  Follow these instructions at home: Medicines  Follow your health care provider's instructions regarding medicine use. Specific medicines may be either safe or unsafe to take during pregnancy.  Take a prenatal vitamin that contains at least 600 micrograms (mcg) of folic acid.  If you develop constipation, try taking a stool softener if your health care provider approves. Eating and drinking  Eat a balanced diet that includes fresh fruits and vegetables, whole grains, good sources of protein such as meat, eggs, or tofu, and low-fat dairy. Your health care provider will help you determine the amount of weight gain that is right for you.  Avoid raw meat and uncooked cheese. These carry germs that can cause birth defects in the baby.  If you have low calcium intake from food, talk to your health care provider about whether you should take a daily calcium supplement.  Limit foods that are high in fat and processed sugars, such as fried and sweet foods.  To prevent constipation: ? Drink enough fluid  to keep your urine clear or pale yellow. ? Eat foods that are high in fiber, such as fresh fruits and vegetables, whole grains, and beans. Activity  Exercise only as directed by your health care provider. Most women can continue their usual exercise routine during pregnancy. Try to exercise for 30 minutes at least 5 days a week. Stop exercising if you experience uterine contractions.  Avoid heavy lifting, wear low heel shoes, and practice good posture.  A sexual relationship may be continued unless your health care provider directs you otherwise. Relieving pain and discomfort  Wear a good support bra to prevent discomfort from breast tenderness.  Take warm sitz baths to soothe any pain or discomfort caused by hemorrhoids. Use hemorrhoid cream if your health care provider approves.  Rest with your legs elevated if you have leg cramps or low back pain.  If you develop varicose veins, wear support hose. Elevate your feet for 15 minutes, 3-4 times  a day. Limit salt in your diet. Prenatal Care  Write down your questions. Take them to your prenatal visits.  Keep all your prenatal visits as told by your health care provider. This is important. Safety  Wear your seat belt at all times when driving.  Make a list of emergency phone numbers, including numbers for family, friends, the hospital, and police and fire departments. General instructions  Ask your health care provider for a referral to a local prenatal education class. Begin classes no later than the beginning of month 6 of your pregnancy.  Ask for help if you have counseling or nutritional needs during pregnancy. Your health care provider can offer advice or refer you to specialists for help with various needs.  Do not use hot tubs, steam rooms, or saunas.  Do not douche or use tampons or scented sanitary pads.  Do not cross your legs for long periods of time.  Avoid cat litter boxes and soil used by cats. These carry germs  that can cause birth defects in the baby and possibly loss of the fetus by miscarriage or stillbirth.  Avoid all smoking, herbs, alcohol, and unprescribed drugs. Chemicals in these products can affect the formation and growth of the baby.  Do not use any products that contain nicotine or tobacco, such as cigarettes and e-cigarettes. If you need help quitting, ask your health care provider.  Visit your dentist if you have not gone yet during your pregnancy. Use a soft toothbrush to brush your teeth and be gentle when you floss. Contact a health care provider if:  You have dizziness.  You have mild pelvic cramps, pelvic pressure, or nagging pain in the abdominal area.  You have persistent nausea, vomiting, or diarrhea.  You have a bad smelling vaginal discharge.  You have pain when you urinate. Get help right away if:  You have a fever.  You are leaking fluid from your vagina.  You have spotting or bleeding from your vagina.  You have severe abdominal cramping or pain.  You have rapid weight gain or weight loss.  You have shortness of breath with chest pain.  You notice sudden or extreme swelling of your face, hands, ankles, feet, or legs.  You have not felt your baby move in over an hour.  You have severe headaches that do not go away when you take medicine.  You have vision changes. Summary  The second trimester is from week 14 through week 27 (months 4 through 6). It is also a time when the fetus is growing rapidly.  Your body goes through many changes during pregnancy. The changes vary from woman to woman.  Avoid all smoking, herbs, alcohol, and unprescribed drugs. These chemicals affect the formation and growth your baby.  Do not use any tobacco products, such as cigarettes, chewing tobacco, and e-cigarettes. If you need help quitting, ask your health care provider.  Contact your health care provider if you have any questions. Keep all prenatal visits as told by  your health care provider. This is important. This information is not intended to replace advice given to you by your health care provider. Make sure you discuss any questions you have with your health care provider. Document Released: 11/12/2001 Document Revised: 04/25/2016 Document Reviewed: 01/19/2013 [employer]vier Interactive Patient Education  2017 [employer]

## 2018-12-07 NOTE — Progress Notes (Signed)
   LOW-RISK PREGNANCY VISIT Patient name: Rebecca [name] MRN [id number]  Date of birth: 1986-08-11 Chief Complaint:   Routine Prenatal Visit  History of Present Illness:   Rebecca [name] is a 33 y.o. [id number] female at 4648w5d with an Estimated Date of Delivery: 06/16/19 being seen today for ongoing management of a low-risk pregnancy. Late for NT u/s this am, rescheduled for next Monday. Today she reports restless leg Lt side; trouble sleeping; stopped Lexapro-made her have bad dreams.  . Vag. Bleeding: None.  Movement: Present. denies leaking of fluid. Review of Systems:   Pertinent items are noted in HPI Denies abnormal vaginal discharge w/ itching/odor/irritation, headaches, visual changes, shortness of breath, chest pain, abdominal pain, severe nausea/vomiting, or problems with urination or bowel movements unless otherwise stated above. Pertinent History Reviewed:  Reviewed past medical,surgical, social, obstetrical and family history.  Reviewed problem list, medications and allergies. Physical Assessment:   Vitals:   12/07/18 0911  BP: 125/82  Pulse: 96  Weight: 144 lb (65.3 kg)  Body mass index is 28.12 kg/m.        Physical Examination:   General appearance: Well appearing, and in no distress  Mental status: Alert, oriented to person, place, and time  Skin: Warm & dry  Cardiovascular: Normal heart rate noted  Respiratory: Normal respiratory effort, no distress  Abdomen: Soft, gravid, nontender  Pelvic: Cervical exam deferred         Extremities: Edema: None  Fetal Status: Fetal Heart Rate (bpm): 156   Movement: Present    Results for orders placed or performed in visit on 12/07/18 (from the past 24 hour(s))  POC Urinalysis Dipstick OB   Collection Time: 12/07/18  9:11 AM  Result Value Ref Range   Color, UA     Clarity, UA     Glucose, UA Negative Negative   Bilirubin, UA     Ketones, UA neg    Spec Grav, UA     Blood, UA neg    pH, UA     POC,PROTEIN,UA  Negative Negative, Trace, Small (1+), Moderate (2+), Large (3+), 4+   Urobilinogen, UA     Nitrite, UA neg    Leukocytes, UA Negative Negative   Appearance     Odor    POCT hemoglobin   Collection Time: 12/07/18  9:41 AM  Result Value Ref Range   Hemoglobin 13.0 11 - 14.6 g/dL    Assessment & Plan:  1) Low-risk pregnancy G3P1102 at 2248w5d with an Estimated Date of Delivery: 06/16/19   2) H/O PTB, wants Makena, ordered today, to begin at 16wks  3) Restless legs> Hgb normal   4) Trouble sleeping> gave printed prevention/relief measures   Meds: No orders of the defined types were placed in this encounter.  Labs/procedures today: none  Plan:  Continue routine obstetrical care   Reviewed: Preterm labor symptoms and general obstetric precautions including but not limited to vaginal bleeding, contractions, leaking of fluid and fetal movement were reviewed in detail with the patient.  All questions were answered  Follow-up: Return for Monday for , US:NT+1stIT (no visit), then 1/29 for LROB, 2nd IT, and Makena.  Orders Placed This Encounter  Procedures  . POC Urinalysis Dipstick OB  . POCT hemoglobin   Cheral MarkerKimberly R Caitlen Worth CNM, [facility]NP-BC 12/07/2018 9:41 AM

## 2018-12-14 ENCOUNTER — Other Ambulatory Visit: Payer: Self-pay

## 2018-12-14 ENCOUNTER — Ambulatory Visit (INDEPENDENT_AMBULATORY_CARE_PROVIDER_SITE_OTHER): Payer: Medicaid Other

## 2018-12-14 DIAGNOSIS — Z1379 Encounter for other screening for genetic and chromosomal anomalies: Secondary | ICD-10-CM | POA: Diagnosis not present

## 2018-12-14 DIAGNOSIS — O09899 Supervision of other high risk pregnancies, unspecified trimester: Secondary | ICD-10-CM

## 2018-12-14 DIAGNOSIS — Z3481 Encounter for supervision of other normal pregnancy, first trimester: Secondary | ICD-10-CM

## 2018-12-14 DIAGNOSIS — O09219 Supervision of pregnancy with history of pre-term labor, unspecified trimester: Secondary | ICD-10-CM

## 2018-12-14 DIAGNOSIS — Z3682 Encounter for antenatal screening for nuchal translucency: Secondary | ICD-10-CM | POA: Diagnosis not present

## 2018-12-14 NOTE — Progress Notes (Signed)
[place] 13+5 wks,measurements c/w dates,normal ovaries bilat,crl 713.34 mm,NB present,NT 2 mm,anterior placenta gr 0,fhr 160 bpm

## 2018-12-16 LAB — INTEGRATED 1
Crown Rump Length: 71.3 mm
Gest. Age on Collection Date: 13.1 weeks
Maternal Age at EDD: 33.7 yr
Nuchal Translucency (NT): 2 mm
Number of Fetuses: 1
PAPP-A Value: 885 ng/mL
Weight: 139 [lb_av]

## 2018-12-30 ENCOUNTER — Encounter: Payer: Self-pay | Admitting: Advanced Practice Midwife

## 2018-12-30 ENCOUNTER — Ambulatory Visit (INDEPENDENT_AMBULATORY_CARE_PROVIDER_SITE_OTHER): Payer: Medicaid Other | Admitting: Advanced Practice Midwife

## 2018-12-30 VITALS — BP 121/75 | HR 92 | Wt 144.0 lb

## 2018-12-30 DIAGNOSIS — Z3482 Encounter for supervision of other normal pregnancy, second trimester: Secondary | ICD-10-CM

## 2018-12-30 DIAGNOSIS — Z331 Pregnant state, incidental: Secondary | ICD-10-CM

## 2018-12-30 DIAGNOSIS — O09892 Supervision of other high risk pregnancies, second trimester: Secondary | ICD-10-CM

## 2018-12-30 DIAGNOSIS — Z3A16 16 weeks gestation of pregnancy: Secondary | ICD-10-CM | POA: Diagnosis not present

## 2018-12-30 DIAGNOSIS — Z1379 Encounter for other screening for genetic and chromosomal anomalies: Secondary | ICD-10-CM | POA: Diagnosis not present

## 2018-12-30 DIAGNOSIS — Z363 Encounter for antenatal screening for malformations: Secondary | ICD-10-CM

## 2018-12-30 DIAGNOSIS — O09212 Supervision of pregnancy with history of pre-term labor, second trimester: Secondary | ICD-10-CM

## 2018-12-30 DIAGNOSIS — Z1389 Encounter for screening for other disorder: Secondary | ICD-10-CM

## 2018-12-30 LAB — POCT URINALYSIS DIPSTICK OB
Blood, UA: NEGATIVE
Glucose, UA: NEGATIVE
Ketones, UA: NEGATIVE
Leukocytes, UA: NEGATIVE
Nitrite, UA: NEGATIVE
POC,PROTEIN,UA: NEGATIVE

## 2018-12-30 MED ORDER — HYDROXYPROGESTERONE CAPROATE 275 MG/1.1ML ~~LOC~~ SOAJ
275.0000 mg | Freq: Once | SUBCUTANEOUS | Status: AC
  Administered 2018-12-30: 275 mg via SUBCUTANEOUS

## 2018-12-30 NOTE — Patient Instructions (Addendum)
Rebecca [name], I greatly value your feedback.  If you receive a survey following your visit with [place] today, we appreciate you taking the time to fill it out.  Thanks, Cathie Beams, CNM     Second Trimester of Pregnancy The second trimester is from week 14 through week 27 (months 4 through 6). The second trimester is often a time when you feel your best. Your body has adjusted to being pregnant, and you begin to feel better physically. Usually, morning sickness has lessened or quit completely, you may have more energy, and you may have an increase in appetite. The second trimester is also a time when the fetus is growing rapidly. At the end of the sixth month, the fetus is about 9 inches long and weighs about 1 pounds. You will likely begin to feel the baby move (quickening) between 16 and 20 weeks of pregnancy. Body changes during your second trimester Your body continues to go through many changes during your second trimester. The changes vary from woman to woman.  Your weight will continue to increase. You will notice your lower abdomen bulging out.  You may begin to get stretch marks on your hips, abdomen, and breasts.  You may develop headaches that can be relieved by medicines. The medicines should be approved by your health care provider.  You may urinate more often because the fetus is pressing on your bladder.  You may develop or continue to have heartburn as a result of your pregnancy.  You may develop constipation because certain hormones are causing the muscles that push waste through your intestines to slow down.  You may develop hemorrhoids or swollen, bulging veins (varicose veins).  You may have back pain. This is caused by: ? Weight gain. ? Pregnancy hormones that are relaxing the joints in your pelvis. ? A shift in weight and the muscles that support your balance.  Your breasts will continue to grow and they will continue to become tender.  Your gums may  bleed and may be sensitive to brushing and flossing.  Dark spots or blotches (chloasma, mask of pregnancy) may develop on your face. This will likely fade after the baby is born.  A dark line from your belly button to the pubic area (linea nigra) may appear. This will likely fade after the baby is born.  You may have changes in your hair. These can include thickening of your hair, rapid growth, and changes in texture. Some women also have hair loss during or after pregnancy, or hair that feels dry or thin. Your hair will most likely return to normal after your baby is born.  What to expect at prenatal visits During a routine prenatal visit:  You will be weighed to make sure you and the fetus are growing normally.  Your blood pressure will be taken.  Your abdomen will be measured to track your baby's growth.  The fetal heartbeat will be listened to.  Any test results from the previous visit will be discussed.  Your health care provider may ask you:  How you are feeling.  If you are feeling the baby move.  If you have had any abnormal symptoms, such as leaking fluid, bleeding, severe headaches, or abdominal cramping.  If you are using any tobacco products, including cigarettes, chewing tobacco, and electronic cigarettes.  If you have any questions.  Other tests that may be performed during your second trimester include:  Blood tests that check for: ? Low iron levels (anemia). ?  High blood sugar that affects pregnant women (gestational diabetes) between 20 and 28 weeks. ? Rh antibodies. This is to check for a protein on red blood cells (Rh factor).  Urine tests to check for infections, diabetes, or protein in the urine.  An ultrasound to confirm the proper growth and development of the baby.  An amniocentesis to check for possible genetic problems.  Fetal screens for spina bifida and Down syndrome.  HIV (human immunodeficiency virus) testing. Routine prenatal testing  includes screening for HIV, unless you choose not to have this test.  Follow these instructions at home: Medicines  Follow your health care provider's instructions regarding medicine use. Specific medicines may be either safe or unsafe to take during pregnancy.  Take a prenatal vitamin that contains at least 600 micrograms (mcg) of folic acid.  If you develop constipation, try taking a stool softener if your health care provider approves. Eating and drinking  Eat a balanced diet that includes fresh fruits and vegetables, whole grains, good sources of protein such as meat, eggs, or tofu, and low-fat dairy. Your health care provider will help you determine the amount of weight gain that is right for you.  Avoid raw meat and uncooked cheese. These carry germs that can cause birth defects in the baby.  If you have low calcium intake from food, talk to your health care provider about whether you should take a daily calcium supplement.  Limit foods that are high in fat and processed sugars, such as fried and sweet foods.  To prevent constipation: ? Drink enough fluid to keep your urine clear or pale yellow. ? Eat foods that are high in fiber, such as fresh fruits and vegetables, whole grains, and beans. Activity  Exercise only as directed by your health care provider. Most women can continue their usual exercise routine during pregnancy. Try to exercise for 30 minutes at least 5 days a week. Stop exercising if you experience uterine contractions.  Avoid heavy lifting, wear low heel shoes, and practice good posture.  A sexual relationship may be continued unless your health care provider directs you otherwise. Relieving pain and discomfort  Wear a good support bra to prevent discomfort from breast tenderness.  Take warm sitz baths to soothe any pain or discomfort caused by hemorrhoids. Use hemorrhoid cream if your health care provider approves.  Rest with your legs elevated if you have  leg cramps or low back pain.  If you develop varicose veins, wear support hose. Elevate your feet for 15 minutes, 3-4 times a day. Limit salt in your diet. Prenatal Care  Write down your questions. Take them to your prenatal visits.  Keep all your prenatal visits as told by your health care provider. This is important. Safety  Wear your seat belt at all times when driving.  Make a list of emergency phone numbers, including numbers for family, friends, the hospital, and police and fire departments. General instructions  Ask your health care provider for a referral to a local prenatal education class. Begin classes no later than the beginning of month 6 of your pregnancy.  Ask for help if you have counseling or nutritional needs during pregnancy. Your health care provider can offer advice or refer you to specialists for help with various needs.  Do not use hot tubs, steam rooms, or saunas.  Do not douche or use tampons or scented sanitary pads.  Do not cross your legs for long periods of time.  Avoid cat litter boxes and  soil used by cats. These carry germs that can cause birth defects in the baby and possibly loss of the fetus by miscarriage or stillbirth.  Avoid all smoking, herbs, alcohol, and unprescribed drugs. Chemicals in these products can affect the formation and growth of the baby.  Do not use any products that contain nicotine or tobacco, such as cigarettes and e-cigarettes. If you need help quitting, ask your health care provider.  Visit your dentist if you have not gone yet during your pregnancy. Use a soft toothbrush to brush your teeth and be gentle when you floss. Contact a health care provider if:  You have dizziness.  You have mild pelvic cramps, pelvic pressure, or nagging pain in the abdominal area.  You have persistent nausea, vomiting, or diarrhea.  You have a bad smelling vaginal discharge.  You have pain when you urinate. Get help right away if:  You  have a fever.  You are leaking fluid from your vagina.  You have spotting or bleeding from your vagina.  You have severe abdominal cramping or pain.  You have rapid weight gain or weight loss.  You have shortness of breath with chest pain.  You notice sudden or extreme swelling of your face, hands, ankles, feet, or legs.  You have not felt your baby move in over an hour.  You have severe headaches that do not go away when you take medicine.  You have vision changes. Summary  The second trimester is from week 14 through week 27 (months 4 through 6). It is also a time when the fetus is growing rapidly.  Your body goes through many changes during pregnancy. The changes vary from woman to woman.  Avoid all smoking, herbs, alcohol, and unprescribed drugs. These chemicals affect the formation and growth your baby.  Do not use any tobacco products, such as cigarettes, chewing tobacco, and e-cigarettes. If you need help quitting, ask your health care provider.  Contact your health care provider if you have any questions. Keep all prenatal visits as told by your health care provider. This is important. This information is not intended to replace advice given to you by your health care provider. Make sure you discuss any questions you have with your health care provider.      CHILDBIRTH CLASSES [phone] is the phone number for Pregnancy Classes or hospital tours at [facility].   You will be referred to  [email or web address] for more information on childbirth classes  At this site you may register for classes. You may sign up for a waiting list if classes are full. Please SIGN UP FOR THIS!.   When the waiting list becomes long, sometimes new classes can be added.    Restless Legs Syndrome Restless legs syndrome is a condition that causes uncomfortable feelings or sensations in the  legs, especially while sitting or lying down. The sensations usually cause an overwhelming urge to move the legs. The arms can also sometimes be affected. The condition can range from mild to severe. The symptoms often interfere with a person's ability to sleep. What are the causes? The cause of this condition is not known. What increases the risk? The following factors may make you more likely to develop this condition:  Being older than 50.  Pregnancy.  Being a woman. In general, the condition is more common in women than in men.  A family history of the condition.  Having iron deficiency.  Overuse of caffeine, nicotine, or alcohol.  Certain medical  conditions, such as kidney disease, Parkinson's disease, or nerve damage.  Certain medicines, such as those for high blood pressure, nausea, colds, allergies, depression, and some heart conditions. What are the signs or symptoms? The main symptom of this condition is uncomfortable sensations in the legs, such as:  Pulling.  Tingling.  Prickling.  Throbbing.  Crawling.  Burning. Usually, the sensations:  Affect both sides of the body.  Are worse when you sit or lie down.  Are worse at night. These may wake you up or make it difficult to fall asleep.  Make you have a strong urge to move your legs.  Are temporarily relieved by moving your legs. The arms can also be affected, but this is rare. People who have this condition often have tiredness during the day because of their lack of sleep at night. How is this diagnosed? This condition may be diagnosed based on:  Your symptoms.  Blood tests. In some cases, you may be monitored in a sleep lab by a specialist (a sleep study). This can detect any disruptions in your sleep. How is this treated? This condition is treated by managing the symptoms. This may include:  Lifestyle changes, such as exercising, using relaxation techniques, and avoiding caffeine, alcohol, or  tobacco.  Medicines. Anti-seizure medicines may be tried first. Follow these instructions at home:     General instructions  Take over-the-counter and prescription medicines only as told by your health care provider.  Use methods to help relieve the uncomfortable sensations, such as: ? Massaging your legs. ? Walking or stretching. ? Taking a cold or hot bath.  Keep all follow-up visits as told by your health care provider. This is important. Lifestyle  Practice good sleep habits. For example, go to bed and get up at the same time every day. Most adults should get 7-9 hours of sleep each night.  Exercise regularly. Try to get at least 30 minutes of exercise most days of the week.  Practice ways of relaxing, such as yoga or meditation.  Avoid caffeine and alcohol.  Do not use any products that contain nicotine or tobacco, such as cigarettes and e-cigarettes. If you need help quitting, ask your health care provider. Contact a health care provider if:  Your symptoms get worse or they do not improve with treatment. Summary  Restless legs syndrome is a condition that causes uncomfortable feelings or sensations in the legs, especially while sitting or lying down.  The symptoms often interfere with a person's ability to sleep.  This condition is treated by managing the symptoms. You may need to make lifestyle changes or take medicines. This information is not intended to replace advice given to you by your health care provider. Make sure you discuss any questions you have with your health care provider. Document Released: 11/08/2002 Document Revised: 12/08/2017 Document Reviewed: 12/08/2017 Elsevier Interactive Patient Education  2019 [employer].

## 2018-12-30 NOTE — Progress Notes (Signed)
LOW-RISK PREGNANCY VISIT Patient name: Rebecca [name] MRN [id number]  Date of birth: [birth year] Chief Complaint:   Routine Prenatal Visit (17P and 2nd IT today; having trouble sleeping; ? restless leg )  History of Present Illness:   Rebecca [name] is a 33 y.o. [id number] female at 5642w0d with an Estimated Date of Delivery: 06/16/19 being seen today for ongoing management of a low-risk pregnancy.  Today she reports difficulty sleeping d/t restless leg. Tips given. Contractions: Not present. Vag. Bleeding: None.  Movement: Present. denies leaking of fluid. Review of Systems:   Pertinent items are noted in HPI Denies abnormal vaginal discharge w/ itching/odor/irritation, headaches, visual changes, shortness of breath, chest pain, abdominal pain, severe nausea/vomiting, or problems with urination or bowel movements unless otherwise stated above.  Pertinent History Reviewed:  Medical & Surgical Hx:   Past Medical History:  Diagnosis Date  . Anxiety   . Back pain   . Boil 05/14/2016  . Breast pain 05/11/2015  . Contraceptive management 08/30/2013  . GERD (gastroesophageal reflux disease)   . LLQ pain 11/04/2014  . OAB (overactive bladder) 04/20/2013  . Reflux   . Smoker 05/14/2016  . Soreness breast 10/25/2013   Left breast sore UOQ, no masses  . Urinary frequency 11/04/2014   Past Surgical History:  Procedure Laterality Date  . NO PAST SURGERIES     Family History  Problem Relation Age of Onset  . Cancer Maternal Grandmother   . Cancer Maternal Grandfather   . CAD Father   . Stroke Father   . Liver disease Father   . Alcohol abuse Father   . Other Mother        brain tumor  . Asthma Son   . Bronchitis Son   . ADD / ADHD Son     Current Outpatient Medications:  .  Doxylamine-Pyridoxine (DICLEGIS) 10-10 MG TBEC, 2 tabs q hs, if sx persist add 1 tab q am on day 3, if sx persist add 1 tab q afternoon on day 4 (Patient taking differently: as needed. 2 tabs q hs, if sx persist add  1 tab q am on day 3, if sx persist add 1 tab q afternoon on day 4), Disp: 100 tablet, Rfl: 6 .  Multiple Vitamin (MULTIVITAMIN) tablet, Take 1 tablet by mouth daily., Disp: , Rfl:  .  omeprazole (PRILOSEC) 20 MG capsule, TAKE 1 CAPSULE BY MOUTH EVERY DAY AS NEEDED, Disp: 30 capsule, Rfl: 3 .  Prenatal Vit-Fe Fumarate-FA (PNV PRENATAL PLUS MULTIVITAMIN) 27-1 MG TABS, Take 1 tablet by mouth daily., Disp: 30 tablet, Rfl: 11 Social History: Reviewed -  reports that she has been smoking cigarettes. She has a 15.00 pack-year smoking history. She has never used smokeless tobacco.  Physical Assessment:   Vitals:   12/30/18 0958  BP: 121/75  Pulse: 92  Weight: 144 lb (65.3 kg)  Body mass index is 28.12 kg/m.        Physical Examination:   General appearance: Well appearing, and in no distress  Mental status: Alert, oriented to person, place, and time  Skin: Warm & dry  Cardiovascular: Normal heart rate noted  Respiratory: Normal respiratory effort, no distress  Abdomen: Soft, gravid, nontender  Pelvic: Cervical exam deferred         Extremities: Edema: Trace  Fetal Status: Fetal Heart Rate (bpm): 152   Movement: Present    Results for orders placed or performed in visit on 12/30/18 (from the past 24 hour(s))  POC Urinalysis  Dipstick OB   Collection Time: 12/30/18  9:59 AM  Result Value Ref Range   Color, UA     Clarity, UA     Glucose, UA Negative Negative   Bilirubin, UA     Ketones, UA neg    Spec Grav, UA     Blood, UA neg    pH, UA     POC,PROTEIN,UA Negative Negative, Trace, Small (1+), Moderate (2+), Large (3+), 4+   Urobilinogen, UA     Nitrite, UA neg    Leukocytes, UA Negative Negative   Appearance     Odor      Assessment & Plan:  1) Low-risk pregnancy [id number] at [redacted]w[redacted]d with an Estimated Date of Delivery: 06/16/19   2) Hx PTD, start Makena today   Labs/procedures/US today: 2nd IT  Plan:  Continue routine obstetrical care    Follow-up: Return for 17P Weekly, 2  weeks for anatomy scan/LROB.  Orders Placed This Encounter  Procedures  . US OB Comp + 14 Wk  . INTEGRATED 2  . POC Urinalysis Dipstick OB   Jacklyn Shell CNM 12/30/2018 10:12 AM

## 2019-01-01 LAB — INTEGRATED 2
AFP MoM: 1.01
Alpha-Fetoprotein: 30.1 ng/mL
Crown Rump Length: 71.3 mm
DIA MoM: 1.58
DIA Value: 281 pg/mL
Estriol, Unconjugated: 0.98 ng/mL
Gest. Age on Collection Date: 13.1 weeks
Gestational Age: 15.4 weeks
Maternal Age at EDD: 33.7 yr
NUMBER OF FETUSES: 1
Nuchal Translucency (NT): 2 mm
Nuchal Translucency MoM: 1.23
PAPP-A MoM: 0.67
PAPP-A Value: 885 ng/mL
Test Results:: NEGATIVE
WEIGHT: 144 [lb_av]
Weight: 139 [lb_av]
hCG MoM: 0.32
hCG Value: 14.4 IU/mL
uE3 MoM: 1.25

## 2019-01-06 ENCOUNTER — Ambulatory Visit (INDEPENDENT_AMBULATORY_CARE_PROVIDER_SITE_OTHER): Payer: Medicaid Other | Admitting: Women's Health

## 2019-01-06 VITALS — BP 111/77 | HR 93 | Ht 60.0 in | Wt 144.0 lb

## 2019-01-06 DIAGNOSIS — O26892 Other specified pregnancy related conditions, second trimester: Secondary | ICD-10-CM | POA: Diagnosis not present

## 2019-01-06 DIAGNOSIS — Z331 Pregnant state, incidental: Secondary | ICD-10-CM

## 2019-01-06 DIAGNOSIS — N898 Other specified noninflammatory disorders of vagina: Secondary | ICD-10-CM | POA: Diagnosis not present

## 2019-01-06 DIAGNOSIS — Z3482 Encounter for supervision of other normal pregnancy, second trimester: Secondary | ICD-10-CM | POA: Diagnosis not present

## 2019-01-06 DIAGNOSIS — Z1389 Encounter for screening for other disorder: Secondary | ICD-10-CM

## 2019-01-06 DIAGNOSIS — Z3A17 17 weeks gestation of pregnancy: Secondary | ICD-10-CM

## 2019-01-06 DIAGNOSIS — O09212 Supervision of pregnancy with history of pre-term labor, second trimester: Secondary | ICD-10-CM

## 2019-01-06 LAB — POCT URINALYSIS DIPSTICK OB
Blood, UA: NEGATIVE
Glucose, UA: NEGATIVE
Ketones, UA: NEGATIVE
LEUKOCYTES UA: NEGATIVE
NITRITE UA: NEGATIVE
PROTEIN: NEGATIVE

## 2019-01-06 LAB — POCT WET PREP (WET MOUNT)
Clue Cells Wet Prep Whiff POC: NEGATIVE
Trichomonas Wet Prep HPF POC: ABSENT

## 2019-01-06 MED ORDER — HYDROXYPROGESTERONE CAPROATE 275 MG/1.1ML ~~LOC~~ SOAJ
275.0000 mg | Freq: Once | SUBCUTANEOUS | Status: AC
  Administered 2019-01-06: 275 mg via SUBCUTANEOUS

## 2019-01-06 NOTE — Progress Notes (Signed)
Work-in LOW-RISK PREGNANCY VISIT Patient name: Rebecca [name] MRN [id number]  Date of birth: [birth year] Chief Complaint:   work-in-ob (yellow color vaginal discharge/ 17p)  History of Present Illness:   Rebecca [name] is a [age] y.o. [id number] female at 1939w0d with an Estimated Date of Delivery: 06/16/19 being seen today for ongoing management of a low-risk pregnancy.  Today she is being seen as a work-in for report of stringy yellow odorous d/c since yesterday, some cramping. No itching/irritation.  .  .   . denies leaking of fluid. Review of Systems:   Pertinent items are noted in HPI Denies abnormal vaginal discharge w/ itching/odor/irritation, headaches, visual changes, shortness of breath, chest pain, abdominal pain, severe nausea/vomiting, or problems with urination or bowel movements unless otherwise stated above. Pertinent History Reviewed:  Reviewed past medical,surgical, social, obstetrical and family history.  Reviewed problem list, medications and allergies. Physical Assessment:   Vitals:   01/06/19 0926  BP: 111/77  Pulse: 93  Weight: 144 lb (65.3 kg)  Height: 5' (1.524 m)  Body mass index is 28.12 kg/m.        Physical Examination:   General appearance: Well appearing, and in no distress  Mental status: Alert, oriented to person, place, and time  Skin: Warm & dry  Cardiovascular: Normal heart rate noted  Respiratory: Normal respiratory effort, no distress  Abdomen: Soft, gravid, nontender  Pelvic: spec exam: cx visually long/closed, mucousy d/c at cervix, nonodorous         Extremities:  no edema  Fetal Status:        FHR 155  Results for orders placed or performed in visit on 01/06/19 (from the past 24 hour(s))  POC Urinalysis Dipstick OB   Collection Time: 01/06/19  9:30 AM  Result Value Ref Range   Color, UA     Clarity, UA     Glucose, UA Negative Negative   Bilirubin, UA     Ketones, UA neg    Spec Grav, UA     Blood, UA neg    pH, UA     POC,PROTEIN,UA Negative Negative, Trace, Small (1+), Moderate (2+), Large (3+), 4+   Urobilinogen, UA     Nitrite, UA neg    Leukocytes, UA Negative Negative   Appearance     Odor    POCT Wet Prep Mellody Drown([place]et [place])   Collection Time: 01/06/19  9:49 AM  Result Value Ref Range   Source Wet Prep POC vag    WBC, Wet Prep HPF POC many    Bacteria Wet Prep HPF POC Few Few   BACTERIA WET PREP MORPHOLOGY POC     Clue Cells Wet Prep HPF POC None None   Clue Cells Wet Prep Whiff POC Negative Whiff    Yeast Wet Prep HPF POC None None   KOH Wet Prep POC     Trichomonas Wet Prep HPF POC Absent Absent    Assessment & Plan:  1) Low-risk pregnancy G3P1102 at 4339w0d with an Estimated Date of Delivery: 06/16/19   2) Vaginal d/c pregnancy, wet prep normal except many wbc's, will send gc/ct  3) H/O PTB> weekly Makena   Meds:  Meds ordered this encounter  Medications  . HYDROXYprogesterone Caproate SOAJ 275 mg   Labs/procedures today: Makena  Plan:  Continue routine obstetrical care   Reviewed: Preterm labor symptoms and general obstetric precautions including but not limited to vaginal bleeding, contractions, leaking of fluid and fetal movement were reviewed in detail with the  patient.  All questions were answered  Follow-up: Return in about 1 week (around 01/13/2019) for 17p, As scheduled 2/10 for next ob appt.  Orders Placed This Encounter  Procedures  . GC/Chlamydia Probe Amp  . POC Urinalysis Dipstick OB  . POCT Wet Prep [facility] [place])   Cheral Marker CNM, [facility] 01/06/2019 9:50 AM

## 2019-01-09 LAB — GC/CHLAMYDIA PROBE AMP
Chlamydia trachomatis, NAA: NEGATIVE
Neisseria gonorrhoeae by PCR: NEGATIVE

## 2019-01-11 ENCOUNTER — Encounter: Payer: Self-pay | Admitting: Obstetrics & Gynecology

## 2019-01-11 ENCOUNTER — Ambulatory Visit (INDEPENDENT_AMBULATORY_CARE_PROVIDER_SITE_OTHER): Payer: Medicaid Other

## 2019-01-11 ENCOUNTER — Other Ambulatory Visit: Payer: Self-pay

## 2019-01-11 ENCOUNTER — Ambulatory Visit (INDEPENDENT_AMBULATORY_CARE_PROVIDER_SITE_OTHER): Payer: Medicaid Other | Admitting: Obstetrics & Gynecology

## 2019-01-11 VITALS — BP 129/74 | HR 94 | Wt 145.0 lb

## 2019-01-11 DIAGNOSIS — Z363 Encounter for antenatal screening for malformations: Secondary | ICD-10-CM

## 2019-01-11 DIAGNOSIS — Z3402 Encounter for supervision of normal first pregnancy, second trimester: Secondary | ICD-10-CM

## 2019-01-11 DIAGNOSIS — O09899 Supervision of other high risk pregnancies, unspecified trimester: Secondary | ICD-10-CM

## 2019-01-11 DIAGNOSIS — Z8751 Personal history of pre-term labor: Secondary | ICD-10-CM

## 2019-01-11 DIAGNOSIS — Z3A17 17 weeks gestation of pregnancy: Secondary | ICD-10-CM

## 2019-01-11 DIAGNOSIS — O09219 Supervision of pregnancy with history of pre-term labor, unspecified trimester: Secondary | ICD-10-CM

## 2019-01-11 DIAGNOSIS — O09212 Supervision of pregnancy with history of pre-term labor, second trimester: Secondary | ICD-10-CM | POA: Diagnosis not present

## 2019-01-11 DIAGNOSIS — Z3482 Encounter for supervision of other normal pregnancy, second trimester: Secondary | ICD-10-CM

## 2019-01-11 DIAGNOSIS — Z331 Pregnant state, incidental: Secondary | ICD-10-CM

## 2019-01-11 DIAGNOSIS — Z1389 Encounter for screening for other disorder: Secondary | ICD-10-CM

## 2019-01-11 LAB — POCT URINALYSIS DIPSTICK OB
Blood, UA: NEGATIVE
Glucose, UA: NEGATIVE
Ketones, UA: NEGATIVE
Leukocytes, UA: NEGATIVE
Nitrite, UA: NEGATIVE
POC,PROTEIN,UA: NEGATIVE

## 2019-01-11 MED ORDER — HYDROXYPROGESTERONE CAPROATE 275 MG/1.1ML ~~LOC~~ SOAJ
275.0000 mg | SUBCUTANEOUS | Status: AC
  Administered 2019-01-11 – 2019-03-22 (×6): 275 mg via SUBCUTANEOUS

## 2019-01-11 NOTE — Progress Notes (Signed)
   LOW-RISK PREGNANCY VISIT Patient name: [name] MRN [id number]  Date of birth: [birth year] Chief Complaint:   Routine Prenatal Visit (u/s today; 17P)  History of Present Illness:   Rebecca [name] is a 33 y.o. [id number] female at [redacted]w[redacted]d with an Estimated Date of Delivery: 06/16/19 being seen today for ongoing management of a low-risk pregnancy.  Today she reports no complaints.  . Vag. Bleeding: None.  Movement: Present. denies leaking of fluid. Review of Systems:   Pertinent items are noted in HPI Denies abnormal vaginal discharge w/ itching/odor/irritation, headaches, visual changes, shortness of breath, chest pain, abdominal pain, severe nausea/vomiting, or problems with urination or bowel movements unless otherwise stated above. Pertinent History Reviewed:  Reviewed past medical,surgical, social, obstetrical and family history.  Reviewed problem list, medications and allergies. Physical Assessment:   Vitals:   01/11/19 1158  BP: 129/74  Pulse: 94  Weight: 145 lb (65.8 kg)  Body mass index is 28.32 kg/m.        Physical Examination:   General appearance: Well appearing, and in no distress  Mental status: Alert, oriented to person, place, and time  Skin: Warm & dry  Cardiovascular: Normal heart rate noted  Respiratory: Normal respiratory effort, no distress  Abdomen: Soft, gravid, nontender  Pelvic: Cervical exam deferred         Extremities: Edema: None  Fetal Status:     Movement: Present    Results for orders placed or performed in visit on 01/11/19 (from the past 24 hour(s))  POC Urinalysis Dipstick OB   Collection Time: 01/11/19 12:01 PM  Result Value Ref Range   Color, UA     Clarity, UA     Glucose, UA Negative Negative   Bilirubin, UA     Ketones, UA neg    Spec Grav, UA     Blood, UA neg    pH, UA     POC,PROTEIN,UA Negative Negative, Trace, Small (1+), Moderate (2+), Large (3+), 4+   Urobilinogen, UA     Nitrite, UA neg    Leukocytes, UA  Negative Negative   Appearance     Odor      Assessment & Plan:  1) Low-risk pregnancy [id number] at [redacted]w[redacted]d with an Estimated Date of Delivery: 06/16/19   2) normal sonogram,    Meds:  Meds ordered this encounter  Medications  . HYDROXYprogesterone Caproate SOAJ 275 mg   Labs/procedures today: sonogram  Plan:  Continue routine obstetrical care   Reviewed: Preterm labor symptoms and general obstetric precautions including but not limited to vaginal bleeding, contractions, leaking of fluid and fetal movement were reviewed in detail with the patient.  All questions were answered  Follow-up: Return in about 1 week (around 01/18/2019) for 17P.  Orders Placed This Encounter  Procedures  . POC Urinalysis Dipstick OB   Amaryllis Dyke Rabiah Goeser  01/11/2019 12:08 PM

## 2019-01-11 NOTE — Progress Notes (Signed)
[place] 17+5 wks,breech,cx 3.5 cm,anterior placenta gr 0,normal ovaries bilat,svp of fluid 5 cm,fhr 157 bpm,efw 212 g 51%,anatomy complete,no obvious abnormalities

## 2019-01-11 NOTE — Progress Notes (Signed)
Pt given Makena 275mg SQ left arm without complications.  

## 2019-01-15 ENCOUNTER — Other Ambulatory Visit: Payer: Self-pay | Admitting: Adult Health

## 2019-01-18 ENCOUNTER — Ambulatory Visit (INDEPENDENT_AMBULATORY_CARE_PROVIDER_SITE_OTHER): Payer: Medicaid Other

## 2019-01-18 VITALS — BP 103/71 | HR 94 | Ht 60.0 in | Wt 147.0 lb

## 2019-01-18 DIAGNOSIS — Z331 Pregnant state, incidental: Secondary | ICD-10-CM

## 2019-01-18 DIAGNOSIS — O09212 Supervision of pregnancy with history of pre-term labor, second trimester: Secondary | ICD-10-CM | POA: Diagnosis not present

## 2019-01-18 DIAGNOSIS — O09892 Supervision of other high risk pregnancies, second trimester: Secondary | ICD-10-CM

## 2019-01-18 DIAGNOSIS — Z1389 Encounter for screening for other disorder: Secondary | ICD-10-CM

## 2019-01-18 LAB — POCT URINALYSIS DIPSTICK OB
Blood, UA: NEGATIVE
Glucose, UA: NEGATIVE
Ketones, UA: NEGATIVE
Leukocytes, UA: NEGATIVE
Nitrite, UA: NEGATIVE
POC,PROTEIN,UA: NEGATIVE

## 2019-01-18 MED ORDER — HYDROXYPROGESTERONE CAPROATE 275 MG/1.1ML ~~LOC~~ SOAJ
275.0000 mg | Freq: Once | SUBCUTANEOUS | Status: AC
  Administered 2019-01-18: 275 mg via SUBCUTANEOUS

## 2019-01-18 NOTE — Progress Notes (Signed)
Pt here makena 275mg  SQ left arm. Tolerated well. Return 1 week for next injection. Pad CMA

## 2019-01-19 ENCOUNTER — Telehealth: Payer: Self-pay | Admitting: Women's Health

## 2019-01-19 NOTE — Telephone Encounter (Signed)
Patient called, is 18 weeks and a few days.  She needs a note from [place] stating what kind of dental work she can have done being that she's pregnant.  Fax # [phone] attn dental clinic   [phone] pt #

## 2019-01-19 NOTE — Telephone Encounter (Signed)
Patient informed dental waiver sent.

## 2019-01-25 ENCOUNTER — Ambulatory Visit (INDEPENDENT_AMBULATORY_CARE_PROVIDER_SITE_OTHER): Payer: Medicaid Other | Admitting: *Deleted

## 2019-01-25 ENCOUNTER — Encounter: Payer: Self-pay | Admitting: *Deleted

## 2019-01-25 ENCOUNTER — Other Ambulatory Visit: Payer: Self-pay

## 2019-01-25 VITALS — BP 120/74 | HR 86 | Ht 60.0 in | Wt 147.0 lb

## 2019-01-25 DIAGNOSIS — O09219 Supervision of pregnancy with history of pre-term labor, unspecified trimester: Secondary | ICD-10-CM | POA: Diagnosis not present

## 2019-01-25 DIAGNOSIS — Z331 Pregnant state, incidental: Secondary | ICD-10-CM

## 2019-01-25 DIAGNOSIS — O09899 Supervision of other high risk pregnancies, unspecified trimester: Secondary | ICD-10-CM

## 2019-01-25 DIAGNOSIS — Z1389 Encounter for screening for other disorder: Secondary | ICD-10-CM

## 2019-01-25 LAB — POCT URINALYSIS DIPSTICK OB
Blood, UA: NEGATIVE
Glucose, UA: NEGATIVE
Ketones, UA: NEGATIVE
LEUKOCYTES UA: NEGATIVE
NITRITE UA: NEGATIVE
PROTEIN: NEGATIVE

## 2019-01-25 NOTE — Progress Notes (Signed)
Pt given Makena 275mg  SQ right arm without complications. Denies LOF or vaginal bleeding, cramping. Advised to return in 1 week for next injection.

## 2019-02-01 ENCOUNTER — Ambulatory Visit (INDEPENDENT_AMBULATORY_CARE_PROVIDER_SITE_OTHER): Payer: Medicaid Other | Admitting: *Deleted

## 2019-02-01 DIAGNOSIS — Z1389 Encounter for screening for other disorder: Secondary | ICD-10-CM

## 2019-02-01 DIAGNOSIS — O09212 Supervision of pregnancy with history of pre-term labor, second trimester: Secondary | ICD-10-CM

## 2019-02-01 DIAGNOSIS — Z331 Pregnant state, incidental: Secondary | ICD-10-CM

## 2019-02-01 LAB — POCT URINALYSIS DIPSTICK OB
Glucose, UA: NEGATIVE
Ketones, UA: NEGATIVE
Leukocytes, UA: NEGATIVE
Nitrite, UA: NEGATIVE
POC,PROTEIN,UA: NEGATIVE
RBC UA: NEGATIVE

## 2019-02-01 MED ORDER — HYDROXYPROGESTERONE CAPROATE 275 MG/1.1ML ~~LOC~~ SOAJ: 275.0000 mg | Freq: Once | SUBCUTANEOUS | Status: DC

## 2019-02-01 NOTE — Telephone Encounter (Signed)
Note sent to nurse. 

## 2019-02-01 NOTE — Progress Notes (Signed)
Pt here for makena 275 mg SQ given lt arm. Tolerated well. Return 1 week for next injection.Pad CMA.

## 2019-02-08 ENCOUNTER — Encounter: Payer: Self-pay | Admitting: Women's Health

## 2019-02-08 ENCOUNTER — Ambulatory Visit (INDEPENDENT_AMBULATORY_CARE_PROVIDER_SITE_OTHER): Payer: Medicaid Other | Admitting: Women's Health

## 2019-02-08 ENCOUNTER — Encounter: Payer: Self-pay | Admitting: *Deleted

## 2019-02-08 VITALS — BP 117/79 | HR 85 | Wt 153.0 lb

## 2019-02-08 DIAGNOSIS — Z331 Pregnant state, incidental: Secondary | ICD-10-CM

## 2019-02-08 DIAGNOSIS — F418 Other specified anxiety disorders: Secondary | ICD-10-CM

## 2019-02-08 DIAGNOSIS — O09212 Supervision of pregnancy with history of pre-term labor, second trimester: Secondary | ICD-10-CM

## 2019-02-08 DIAGNOSIS — O09219 Supervision of pregnancy with history of pre-term labor, unspecified trimester: Secondary | ICD-10-CM

## 2019-02-08 DIAGNOSIS — Z1389 Encounter for screening for other disorder: Secondary | ICD-10-CM

## 2019-02-08 DIAGNOSIS — Z3A21 21 weeks gestation of pregnancy: Secondary | ICD-10-CM

## 2019-02-08 DIAGNOSIS — O09899 Supervision of other high risk pregnancies, unspecified trimester: Secondary | ICD-10-CM

## 2019-02-08 DIAGNOSIS — Z3402 Encounter for supervision of normal first pregnancy, second trimester: Secondary | ICD-10-CM

## 2019-02-08 LAB — POCT URINALYSIS DIPSTICK OB
Blood, UA: NEGATIVE
GLUCOSE, UA: NEGATIVE
Ketones, UA: NEGATIVE
Leukocytes, UA: NEGATIVE
Nitrite, UA: NEGATIVE
POC,PROTEIN,UA: NEGATIVE

## 2019-02-08 MED ORDER — HYDROXYPROGESTERONE CAPROATE 275 MG/1.1ML ~~LOC~~ SOAJ
275.0000 mg | Freq: Once | SUBCUTANEOUS | Status: AC
  Administered 2019-02-08: 275 mg via SUBCUTANEOUS

## 2019-02-08 NOTE — Progress Notes (Signed)
   LOW-RISK PREGNANCY VISIT Patient name: Rebecca [name] MRN [id number]  Date of birth: 03-06-1986 Chief Complaint:   Routine Prenatal Visit (17p)  History of Present Illness:   Rebecca [name] is a 33 y.o. [id number] female at [redacted]w[redacted]d with an Estimated Date of Delivery: 06/16/19 being seen today for ongoing management of a low-risk pregnancy.  Today she reports a lot of pain w/ Makena auto-injectors. Contractions: Not present.  .  Movement: Present. denies leaking of fluid. Review of Systems:   Pertinent items are noted in HPI Denies abnormal vaginal discharge w/ itching/odor/irritation, headaches, visual changes, shortness of breath, chest pain, abdominal pain, severe nausea/vomiting, or problems with urination or bowel movements unless otherwise stated above. Pertinent History Reviewed:  Reviewed past medical,surgical, social, obstetrical and family history.  Reviewed problem list, medications and allergies. Physical Assessment:   Vitals:   02/08/19 0839  BP: 117/79  Pulse: 85  Weight: 153 lb (69.4 kg)  Body mass index is 29.88 kg/m.        Physical Examination:   General appearance: Well appearing, and in no distress  Mental status: Alert, oriented to person, place, and time  Skin: Warm & dry  Cardiovascular: Normal heart rate noted  Respiratory: Normal respiratory effort, no distress  Abdomen: Soft, gravid, nontender  Pelvic: Cervical exam deferred         Extremities: Edema: None  Fetal Status: Fetal Heart Rate (bpm): 159   Movement: Present    Results for orders placed or performed in visit on 02/08/19 (from the past 24 hour(s))  POC Urinalysis Dipstick OB   Collection Time: 02/08/19  8:38 AM  Result Value Ref Range   Color, UA     Clarity, UA     Glucose, UA Negative Negative   Bilirubin, UA     Ketones, UA neg    Spec Grav, UA     Blood, UA neg    pH, UA     POC,PROTEIN,UA Negative Negative, Trace, Small (1+), Moderate (2+), Large (3+), 4+   Urobilinogen, UA      Nitrite, UA neg    Leukocytes, UA Negative Negative   Appearance     Odor      Assessment & Plan:  1) Low-risk pregnancy [id number] at [redacted]w[redacted]d with an Estimated Date of Delivery: 06/16/19   2) H/O PTB, on weekly Makena, sent message to T[facility] to see if can switch to IM from auto-injector d/t pain   Meds:  Meds ordered this encounter  Medications  . HYDROXYprogesterone Caproate SOAJ 275 mg   Labs/procedures today: Makena  Plan:  Continue routine obstetrical care   Reviewed: Preterm labor symptoms and general obstetric precautions including but not limited to vaginal bleeding, contractions, leaking of fluid and fetal movement were reviewed in detail with the patient.  All questions were answered  Follow-up: Return in about 4 weeks (around 03/08/2019) for LROB, Weekly 17P.  Orders Placed This Encounter  Procedures  . POC Urinalysis Dipstick OB   Cheral Marker CNM, [facility] 02/08/2019 8:57 AM

## 2019-02-08 NOTE — Patient Instructions (Signed)
Rebecca [name], I greatly value your feedback.  If you receive a survey following your visit with [place] today, we appreciate you taking the time to fill it out.  Thanks, [name], CNM, [facility]  [facility] HAS [facility]!!! It is now [facility] ([address] [place], [place] [address]) Entrance located off of [address] Free 24/7 valet parking     Second Trimester of Pregnancy The second trimester is from week 14 through week 27 (months 4 through 6). The second trimester is often a time when you feel your best. Your body has adjusted to being pregnant, and you begin to feel better physically. Usually, morning sickness has lessened or quit completely, you may have more energy, and you may have an increase in appetite. The second trimester is also a time when the fetus is growing rapidly. At the end of the sixth month, the fetus is about 9 inches long and weighs about 1 pounds. You will likely begin to feel the baby move (quickening) between 16 and 20 weeks of pregnancy. Body changes during your second trimester Your body continues to go through many changes during your second trimester. The changes vary from woman to woman.  Your weight will continue to increase. You will notice your lower abdomen bulging out.  You may begin to get stretch marks on your hips, abdomen, and breasts.  You may develop headaches that can be relieved by medicines. The medicines should be approved by your health care provider.  You may urinate more often because the fetus is pressing on your bladder.  You may develop or continue to have heartburn as a result of your pregnancy.  You may develop constipation because certain hormones are causing the muscles that push waste through your intestines to slow down.  You may develop hemorrhoids or swollen, bulging veins (varicose veins).  You may have back pain. This is caused by: ? Weight gain. ? Pregnancy hormones that are  relaxing the joints in your pelvis. ? A shift in weight and the muscles that support your balance.  Your breasts will continue to grow and they will continue to become tender.  Your gums may bleed and may be sensitive to brushing and flossing.  Dark spots or blotches (chloasma, mask of pregnancy) may develop on your face. This will likely fade after the baby is born.  A dark line from your belly button to the pubic area (linea nigra) may appear. This will likely fade after the baby is born.  You may have changes in your hair. These can include thickening of your hair, rapid growth, and changes in texture. Some women also have hair loss during or after pregnancy, or hair that feels dry or thin. Your hair will most likely return to normal after your baby is born.  What to expect at prenatal visits During a routine prenatal visit:  You will be weighed to make sure you and the fetus are growing normally.  Your blood pressure will be taken.  Your abdomen will be measured to track your baby's growth.  The fetal heartbeat will be listened to.  Any test results from the previous visit will be discussed.  Your health care provider may ask you:  How you are feeling.  If you are feeling the baby move.  If you have had any abnormal symptoms, such as leaking fluid, bleeding, severe headaches, or abdominal cramping.  If you are using any tobacco products, including cigarettes, chewing  tobacco, and electronic cigarettes.  If you have any questions.  Other tests that may be performed during your second trimester include:  Blood tests that check for: ? Low iron levels (anemia). ? High blood sugar that affects pregnant women (gestational diabetes) between 28 and 28 weeks. ? Rh antibodies. This is to check for a protein on red blood cells (Rh factor).  Urine tests to check for infections, diabetes, or protein in the urine.  An ultrasound to confirm the proper growth and development of the  baby.  An amniocentesis to check for possible genetic problems.  Fetal screens for spina bifida and Down syndrome.  HIV (human immunodeficiency virus) testing. Routine prenatal testing includes screening for HIV, unless you choose not to have this test.  Follow these instructions at home: Medicines  Follow your health care provider's instructions regarding medicine use. Specific medicines may be either safe or unsafe to take during pregnancy.  Take a prenatal vitamin that contains at least 600 micrograms (mcg) of folic acid.  If you develop constipation, try taking a stool softener if your health care provider approves. Eating and drinking  Eat a balanced diet that includes fresh fruits and vegetables, whole grains, good sources of protein such as meat, eggs, or tofu, and low-fat dairy. Your health care provider will help you determine the amount of weight gain that is right for you.  Avoid raw meat and uncooked cheese. These carry germs that can cause birth defects in the baby.  If you have low calcium intake from food, talk to your health care provider about whether you should take a daily calcium supplement.  Limit foods that are high in fat and processed sugars, such as fried and sweet foods.  To prevent constipation: ? Drink enough fluid to keep your urine clear or pale yellow. ? Eat foods that are high in fiber, such as fresh fruits and vegetables, whole grains, and beans. Activity  Exercise only as directed by your health care provider. Most women can continue their usual exercise routine during pregnancy. Try to exercise for 30 minutes at least 5 days a week. Stop exercising if you experience uterine contractions.  Avoid heavy lifting, wear low heel shoes, and practice good posture.  A sexual relationship may be continued unless your health care provider directs you otherwise. Relieving pain and discomfort  Wear a good support bra to prevent discomfort from breast  tenderness.  Take warm sitz baths to soothe any pain or discomfort caused by hemorrhoids. Use hemorrhoid cream if your health care provider approves.  Rest with your legs elevated if you have leg cramps or low back pain.  If you develop varicose veins, wear support hose. Elevate your feet for 15 minutes, 3-4 times a day. Limit salt in your diet. Prenatal Care  Write down your questions. Take them to your prenatal visits.  Keep all your prenatal visits as told by your health care provider. This is important. Safety  Wear your seat belt at all times when driving.  Make a list of emergency phone numbers, including numbers for family, friends, the hospital, and police and fire departments. General instructions  Ask your health care provider for a referral to a local prenatal education class. Begin classes no later than the beginning of month 6 of your pregnancy.  Ask for help if you have counseling or nutritional needs during pregnancy. Your health care provider can offer advice or refer you to specialists for help with various needs.  Do not use  hot tubs, steam rooms, or saunas.  Do not douche or use tampons or scented sanitary pads.  Do not cross your legs for long periods of time.  Avoid cat litter boxes and soil used by cats. These carry germs that can cause birth defects in the baby and possibly loss of the fetus by miscarriage or stillbirth.  Avoid all smoking, herbs, alcohol, and unprescribed drugs. Chemicals in these products can affect the formation and growth of the baby.  Do not use any products that contain nicotine or tobacco, such as cigarettes and e-cigarettes. If you need help quitting, ask your health care provider.  Visit your dentist if you have not gone yet during your pregnancy. Use a soft toothbrush to brush your teeth and be gentle when you floss. Contact a health care provider if:  You have dizziness.  You have mild pelvic cramps, pelvic pressure, or  nagging pain in the abdominal area.  You have persistent nausea, vomiting, or diarrhea.  You have a bad smelling vaginal discharge.  You have pain when you urinate. Get help right away if:  You have a fever.  You are leaking fluid from your vagina.  You have spotting or bleeding from your vagina.  You have severe abdominal cramping or pain.  You have rapid weight gain or weight loss.  You have shortness of breath with chest pain.  You notice sudden or extreme swelling of your face, hands, ankles, feet, or legs.  You have not felt your baby move in over an hour.  You have severe headaches that do not go away when you take medicine.  You have vision changes. Summary  The second trimester is from week 14 through week 27 (months 4 through 6). It is also a time when the fetus is growing rapidly.  Your body goes through many changes during pregnancy. The changes vary from woman to woman.  Avoid all smoking, herbs, alcohol, and unprescribed drugs. These chemicals affect the formation and growth your baby.  Do not use any tobacco products, such as cigarettes, chewing tobacco, and e-cigarettes. If you need help quitting, ask your health care provider.  Contact your health care provider if you have any questions. Keep all prenatal visits as told by your health care provider. This is important. This information is not intended to replace advice given to you by your health care provider. Make sure you discuss any questions you have with your health care provider. Document Released: 11/12/2001 Document Revised: 04/25/2016 Document Reviewed: 01/19/2013 Elsevier Interactive Patient Education  2017 [employer].

## 2019-02-15 ENCOUNTER — Encounter: Payer: Self-pay | Admitting: *Deleted

## 2019-02-15 ENCOUNTER — Other Ambulatory Visit: Payer: Self-pay

## 2019-02-15 ENCOUNTER — Ambulatory Visit (INDEPENDENT_AMBULATORY_CARE_PROVIDER_SITE_OTHER): Payer: Medicaid Other | Admitting: *Deleted

## 2019-02-15 VITALS — BP 107/73 | HR 97

## 2019-02-15 DIAGNOSIS — O09219 Supervision of pregnancy with history of pre-term labor, unspecified trimester: Principal | ICD-10-CM

## 2019-02-15 DIAGNOSIS — Z3A21 21 weeks gestation of pregnancy: Secondary | ICD-10-CM

## 2019-02-15 DIAGNOSIS — Z1389 Encounter for screening for other disorder: Secondary | ICD-10-CM

## 2019-02-15 DIAGNOSIS — O09212 Supervision of pregnancy with history of pre-term labor, second trimester: Secondary | ICD-10-CM | POA: Diagnosis not present

## 2019-02-15 DIAGNOSIS — Z331 Pregnant state, incidental: Secondary | ICD-10-CM

## 2019-02-15 DIAGNOSIS — O09899 Supervision of other high risk pregnancies, unspecified trimester: Secondary | ICD-10-CM

## 2019-02-15 LAB — POCT URINALYSIS DIPSTICK OB
Blood, UA: NEGATIVE
Glucose, UA: NEGATIVE
Ketones, UA: NEGATIVE
Leukocytes, UA: NEGATIVE
Nitrite, UA: NEGATIVE
POC,PROTEIN,UA: NEGATIVE

## 2019-02-15 MED ORDER — HYDROXYPROGESTERONE CAPROATE 250 MG/ML IM OIL
250.0000 mg | TOPICAL_OIL | Freq: Once | INTRAMUSCULAR | Status: AC
  Administered 2019-02-15: 250 mg via INTRAMUSCULAR

## 2019-02-15 NOTE — Progress Notes (Signed)
17P given in right deltoid with no complications.

## 2019-02-22 ENCOUNTER — Other Ambulatory Visit: Payer: Self-pay

## 2019-02-22 ENCOUNTER — Ambulatory Visit (INDEPENDENT_AMBULATORY_CARE_PROVIDER_SITE_OTHER): Payer: Medicaid Other

## 2019-02-22 VITALS — BP 119/79 | HR 91 | Ht 60.0 in | Wt 155.0 lb

## 2019-02-22 DIAGNOSIS — O09212 Supervision of pregnancy with history of pre-term labor, second trimester: Secondary | ICD-10-CM | POA: Diagnosis not present

## 2019-02-22 DIAGNOSIS — Z1389 Encounter for screening for other disorder: Secondary | ICD-10-CM

## 2019-02-22 DIAGNOSIS — Z8751 Personal history of pre-term labor: Secondary | ICD-10-CM

## 2019-02-22 DIAGNOSIS — Z331 Pregnant state, incidental: Secondary | ICD-10-CM

## 2019-02-22 LAB — POCT URINALYSIS DIPSTICK OB
Blood, UA: NEGATIVE
Glucose, UA: NEGATIVE
Ketones, UA: NEGATIVE
Leukocytes, UA: NEGATIVE
Nitrite, UA: NEGATIVE
POC,PROTEIN,UA: NEGATIVE

## 2019-02-22 MED ORDER — HYDROXYPROGESTERONE CAPROATE 275 MG/1.1ML ~~LOC~~ SOAJ
275.0000 mg | Freq: Once | SUBCUTANEOUS | Status: AC
  Administered 2019-02-22: 275 mg via SUBCUTANEOUS

## 2019-02-22 NOTE — Progress Notes (Signed)
Pt here for Makena 275 mg SQ left arm. Tolerated well. Return 1 week for next injection. Pad CMA

## 2019-03-01 ENCOUNTER — Encounter: Payer: Self-pay | Admitting: *Deleted

## 2019-03-01 ENCOUNTER — Other Ambulatory Visit: Payer: Self-pay

## 2019-03-01 ENCOUNTER — Ambulatory Visit (INDEPENDENT_AMBULATORY_CARE_PROVIDER_SITE_OTHER): Payer: Medicaid Other | Admitting: *Deleted

## 2019-03-01 VITALS — BP 118/74 | HR 112 | Wt 155.0 lb

## 2019-03-01 DIAGNOSIS — Z331 Pregnant state, incidental: Secondary | ICD-10-CM

## 2019-03-01 DIAGNOSIS — Z3482 Encounter for supervision of other normal pregnancy, second trimester: Secondary | ICD-10-CM

## 2019-03-01 DIAGNOSIS — Z1389 Encounter for screening for other disorder: Secondary | ICD-10-CM

## 2019-03-01 DIAGNOSIS — O09212 Supervision of pregnancy with history of pre-term labor, second trimester: Secondary | ICD-10-CM | POA: Diagnosis not present

## 2019-03-01 DIAGNOSIS — O09219 Supervision of pregnancy with history of pre-term labor, unspecified trimester: Secondary | ICD-10-CM

## 2019-03-01 DIAGNOSIS — O09899 Supervision of other high risk pregnancies, unspecified trimester: Secondary | ICD-10-CM

## 2019-03-01 LAB — POCT URINALYSIS DIPSTICK OB
Blood, UA: NEGATIVE
Glucose, UA: NEGATIVE
Ketones, UA: NEGATIVE
Nitrite, UA: NEGATIVE
POC,PROTEIN,UA: NEGATIVE

## 2019-03-01 MED ORDER — HYDROXYPROGESTERONE CAPROATE 250 MG/ML IM OIL
250.0000 mg | TOPICAL_OIL | Freq: Once | INTRAMUSCULAR | Status: AC
  Administered 2019-03-01: 250 mg via INTRAMUSCULAR

## 2019-03-01 NOTE — Progress Notes (Signed)
17P given in right deltoid with no complications. Pt to return in 1 week for next injection.

## 2019-03-08 ENCOUNTER — Encounter: Payer: Self-pay | Admitting: Women's Health

## 2019-03-08 ENCOUNTER — Ambulatory Visit (INDEPENDENT_AMBULATORY_CARE_PROVIDER_SITE_OTHER): Payer: Medicaid Other | Admitting: Obstetrics & Gynecology

## 2019-03-08 ENCOUNTER — Telehealth: Payer: Self-pay | Admitting: Obstetrics & Gynecology

## 2019-03-08 ENCOUNTER — Other Ambulatory Visit: Payer: Self-pay

## 2019-03-08 VITALS — BP 115/71 | HR 95 | Wt 154.0 lb

## 2019-03-08 DIAGNOSIS — O09219 Supervision of pregnancy with history of pre-term labor, unspecified trimester: Secondary | ICD-10-CM

## 2019-03-08 DIAGNOSIS — O09899 Supervision of other high risk pregnancies, unspecified trimester: Secondary | ICD-10-CM

## 2019-03-08 DIAGNOSIS — Z3A25 25 weeks gestation of pregnancy: Secondary | ICD-10-CM

## 2019-03-08 DIAGNOSIS — Z1389 Encounter for screening for other disorder: Secondary | ICD-10-CM

## 2019-03-08 DIAGNOSIS — O09212 Supervision of pregnancy with history of pre-term labor, second trimester: Secondary | ICD-10-CM | POA: Diagnosis not present

## 2019-03-08 DIAGNOSIS — Z331 Pregnant state, incidental: Secondary | ICD-10-CM

## 2019-03-08 DIAGNOSIS — Z3482 Encounter for supervision of other normal pregnancy, second trimester: Secondary | ICD-10-CM

## 2019-03-08 LAB — POCT URINALYSIS DIPSTICK OB
Blood, UA: NEGATIVE
Glucose, UA: NEGATIVE
Ketones, UA: NEGATIVE
Leukocytes, UA: NEGATIVE
Nitrite, UA: NEGATIVE
POC,PROTEIN,UA: NEGATIVE

## 2019-03-08 MED ORDER — METRONIDAZOLE 0.75 % VA GEL: VAGINAL | 0 refills | Status: DC

## 2019-03-08 NOTE — Telephone Encounter (Signed)
[facility]becca [name] called, she is keeping track of the pt's 17p's and she wanted to see how that worked today since she had a webex/tele visit today.  [phone]

## 2019-03-08 NOTE — Telephone Encounter (Signed)
Alvino Chapel aware pt did get 17P injection today and was given one to take home for next week's injection. Pt saw Dr. Despina Hidden today, she didn't have tele visit or webex visit. JSY

## 2019-03-08 NOTE — Progress Notes (Signed)
   LOW-RISK PREGNANCY VISIT Patient name: YARIELYS DENONCOURT MRN [id number]  Date of birth: [birth year] Chief Complaint:   Routine Prenatal Visit (17P)  History of Present Illness:   ALTAMAE LIPKA is a 33 y.o. [id number] female at [redacted]w[redacted]d with an Estimated Date of Delivery: 06/16/19 being seen today for ongoing management of a low-risk pregnancy.  Today she reports malodorous yellow vaginal discharge. Contractions: Not present. Vag. Bleeding: None.  Movement: Present. denies leaking of fluid. Review of Systems:   Pertinent items are noted in HPI Denies abnormal vaginal discharge w/ itching/odor/irritation, headaches, visual changes, shortness of breath, chest pain, abdominal pain, severe nausea/vomiting, or problems with urination or bowel movements unless otherwise stated above. Pertinent History Reviewed:  Reviewed past medical,surgical, social, obstetrical and family history.  Reviewed problem list, medications and allergies. Physical Assessment:   Vitals:   03/08/19 0939  BP: 115/71  Pulse: 95  Weight: 154 lb (69.9 kg)  Body mass index is 30.08 kg/m.        Physical Examination:   General appearance: Well appearing, and in no distress  Mental status: Alert, oriented to person, place, and time  Skin: Warm & dry  Cardiovascular: Normal heart rate noted  Respiratory: Normal respiratory effort, no distress  Abdomen: Soft, gravid, nontender  Pelvic: +BV on SSE         Extremities: Edema: Trace  Fetal Status:     Movement: Present    Results for orders placed or performed in visit on 03/08/19 (from the past 24 hour(s))  POC Urinalysis Dipstick OB   Collection Time: 03/08/19  9:38 AM  Result Value Ref Range   Color, UA     Clarity, UA     Glucose, UA Negative Negative   Bilirubin, UA     Ketones, UA neg    Spec Grav, UA     Blood, UA neg    pH, UA     POC,PROTEIN,UA Negative Negative, Trace, Small (1+), Moderate (2+), Large (3+), 4+   Urobilinogen, UA     Nitrite, UA neg     Leukocytes, UA Negative Negative   Appearance     Odor      Assessment & Plan:  1) Low-risk pregnancy [id number] at [redacted]w[redacted]d with an Estimated Date of Delivery: 06/16/19   2) BV, metro gel,    Meds:  Meds ordered this encounter  Medications  . metroNIDAZOLE (METROGEL VAGINAL) 0.75 % vaginal gel    Sig: Nightly x 5 nights    Dispense:  70 g    Refill:  0   Labs/procedures today:   Plan:  Continue routine obstetrical care 3 weeks PN2, continue weekly 17P  Reviewed: Preterm labor symptoms and general obstetric precautions including but not limited to vaginal bleeding, contractions, leaking of fluid and fetal movement were reviewed in detail with the patient.  All questions were answered  Follow-up: Return in about 3 weeks (around 03/29/2019) for PN2, in office visit, LROB.  Orders Placed This Encounter  Procedures  . POC Urinalysis Dipstick OB   Amaryllis Dyke  03/08/2019 10:16 AM

## 2019-03-26 ENCOUNTER — Telehealth: Payer: Self-pay | Admitting: *Deleted

## 2019-03-26 NOTE — Telephone Encounter (Signed)
Covid restrictions sent in [employer].

## 2019-03-29 ENCOUNTER — Encounter: Payer: Self-pay | Admitting: Obstetrics and Gynecology

## 2019-03-29 ENCOUNTER — Other Ambulatory Visit: Payer: Self-pay

## 2019-03-29 ENCOUNTER — Other Ambulatory Visit: Payer: Medicaid Other

## 2019-03-29 ENCOUNTER — Ambulatory Visit (INDEPENDENT_AMBULATORY_CARE_PROVIDER_SITE_OTHER): Payer: Medicaid Other | Admitting: Obstetrics and Gynecology

## 2019-03-29 VITALS — BP 122/77 | HR 90 | Temp 97.8°F | Wt 157.2 lb

## 2019-03-29 DIAGNOSIS — O09899 Supervision of other high risk pregnancies, unspecified trimester: Secondary | ICD-10-CM

## 2019-03-29 DIAGNOSIS — Z331 Pregnant state, incidental: Secondary | ICD-10-CM

## 2019-03-29 DIAGNOSIS — Z1389 Encounter for screening for other disorder: Secondary | ICD-10-CM

## 2019-03-29 DIAGNOSIS — Z3A28 28 weeks gestation of pregnancy: Secondary | ICD-10-CM

## 2019-03-29 DIAGNOSIS — Z3483 Encounter for supervision of other normal pregnancy, third trimester: Secondary | ICD-10-CM

## 2019-03-29 DIAGNOSIS — O09213 Supervision of pregnancy with history of pre-term labor, third trimester: Secondary | ICD-10-CM

## 2019-03-29 DIAGNOSIS — Z23 Encounter for immunization: Secondary | ICD-10-CM

## 2019-03-29 DIAGNOSIS — Z3009 Encounter for other general counseling and advice on contraception: Secondary | ICD-10-CM

## 2019-03-29 DIAGNOSIS — O09219 Supervision of pregnancy with history of pre-term labor, unspecified trimester: Secondary | ICD-10-CM

## 2019-03-29 LAB — POCT URINALYSIS DIPSTICK OB
Blood, UA: NEGATIVE
Glucose, UA: NEGATIVE
Ketones, UA: NEGATIVE
Nitrite, UA: NEGATIVE

## 2019-03-29 NOTE — Progress Notes (Signed)
Subjective:  Rebecca [name] is a 33 y.o. [phone] at [redacted]w[redacted]d being seen today for ongoing prenatal care.  She is currently monitored for the following issues for this high-risk pregnancy and has Depression with anxiety; OAB (overactive bladder); Smoker; Elevated cholesterol; Supervision of normal pregnancy; History of preterm delivery, currently pregnant; Rubella non-immune status, antepartum; and Unwanted fertility on their problem list.  Patient reports no complaints.  Contractions: Not present. Vag. Bleeding: None.  Movement: Present. Denies leaking of fluid.   The following portions of the patient's history were reviewed and updated as appropriate: allergies, current medications, past family history, past medical history, past social history, past surgical history and problem list. Problem list updated.  Objective:   Vitals:   03/29/19 0928  BP: 122/77  Pulse: 90  Temp: 97.8 F (36.6 C)  Weight: 157 lb 3.2 oz (71.3 kg)    Fetal Status:     Movement: Present     General:  Alert, oriented and cooperative. Patient is in no acute distress.  Skin: Skin is warm and dry. No rash noted.   Cardiovascular: Normal heart rate noted  Respiratory: Normal respiratory effort, no problems with respiration noted  Abdomen: Soft, gravid, appropriate for gestational age. Pain/Pressure: Absent     Pelvic:  Cervical exam deferred        Extremities: Normal range of motion.  Edema: Trace  Mental Status: Normal mood and affect. Normal behavior. Normal judgment and thought content.   Urinalysis:      Assessment and Plan:  Pregnancy: G3P1102 at [redacted]w[redacted]d  1. Encounter for supervision of other normal pregnancy in third trimester Stable Glucola today BabyRx today - Tdap vaccine greater than or equal to 7yo IM  2. Screening for genitourinary condition  - POC Urinalysis Dipstick OB  3. Pregnant state, incidental  - POC Urinalysis Dipstick OB  4. [redacted] weeks gestation of pregnancy  - Tdap vaccine  greater than or equal to 7yo IM  5. Need for vaccination  - Tdap vaccine greater than or equal to 7yo IM  6. History of preterm delivery, currently pregnant Stable Continue with weekly 17 OHp  7. Unwanted fertility BTL papers signed today  Preterm labor symptoms and general obstetric precautions including but not limited to vaginal bleeding, contractions, leaking of fluid and fetal movement were reviewed in detail with the patient. Please refer to After Visit Summary for other counseling recommendations.  Return in about 4 weeks (around 04/26/2019) for OB visit, televisit.   Hermina Staggers, MD

## 2019-03-29 NOTE — Patient Instructions (Addendum)
Third Trimester of Pregnancy The third trimester is from week 28 through week 40 (months 7 through 9). The third trimester is a time when the unborn baby (fetus) is growing rapidly. At the end of the ninth month, the fetus is about 20 inches in length and weighs 6-10 pounds. Body changes during your third trimester Your body will continue to go through many changes during pregnancy. The changes vary from woman to woman. During the third trimester:  Your weight will continue to increase. You can expect to gain 25-35 pounds (11-16 kg) by the end of the pregnancy.  You may begin to get stretch marks on your hips, abdomen, and breasts.  You may urinate more often because the fetus is moving lower into your pelvis and pressing on your bladder.  You may develop or continue to have heartburn. This is caused by increased hormones that slow down muscles in the digestive tract.  You may develop or continue to have constipation because increased hormones slow digestion and cause the muscles that push waste through your intestines to relax.  You may develop hemorrhoids. These are swollen veins (varicose veins) in the rectum that can itch or be painful.  You may develop swollen, bulging veins (varicose veins) in your legs.  You may have increased body aches in the pelvis, back, or thighs. This is due to weight gain and increased hormones that are relaxing your joints.  You may have changes in your hair. These can include thickening of your hair, rapid growth, and changes in texture. Some women also have hair loss during or after pregnancy, or hair that feels dry or thin. Your hair will most likely return to normal after your baby is born.  Your breasts will continue to grow and they will continue to become tender. A yellow fluid (colostrum) may leak from your breasts. This is the first milk you are producing for your baby.  Your belly button may stick out.  You may notice more swelling in your hands,  face, or ankles.  You may have increased tingling or numbness in your hands, arms, and legs. The skin on your belly may also feel numb.  You may feel short of breath because of your expanding uterus.  You may have more problems sleeping. This can be caused by the size of your belly, increased need to urinate, and an increase in your body's metabolism.  You may notice the fetus "dropping," or moving lower in your abdomen (lightening).  You may have increased vaginal discharge.  You may notice your joints feel loose and you may have pain around your pelvic bone. What to expect at prenatal visits You will have prenatal exams every 2 weeks until week 36. Then you will have weekly prenatal exams. During a routine prenatal visit:  You will be weighed to make sure you and the baby are growing normally.  Your blood pressure will be taken.  Your abdomen will be measured to track your baby's growth.  The fetal heartbeat will be listened to.  Any test results from the previous visit will be discussed.  You may have a cervical check near your due date to see if your cervix has softened or thinned (effaced).  You will be tested for Group B streptococcus. This happens between 35 and 37 weeks. Your health care provider may ask you:  What your birth plan is.  How you are feeling.  If you are feeling the baby move.  If you have had any abnormal   symptoms, such as leaking fluid, bleeding, severe headaches, or abdominal cramping.  If you are using any tobacco products, including cigarettes, chewing tobacco, and electronic cigarettes.  If you have any questions. Other tests or screenings that may be performed during your third trimester include:  Blood tests that check for low iron levels (anemia).  Fetal testing to check the health, activity level, and growth of the fetus. Testing is done if you have certain medical conditions or if there are problems during the pregnancy.  Nonstress test  (NST). This test checks the health of your baby to make sure there are no signs of problems, such as the baby not getting enough oxygen. During this test, a belt is placed around your belly. The baby is made to move, and its heart rate is monitored during movement. What is false labor? False labor is a condition in which you feel small, irregular tightenings of the muscles in the womb (contractions) that usually go away with rest, changing position, or drinking water. These are called Braxton Hicks contractions. Contractions may last for hours, days, or even weeks before true labor sets in. If contractions come at regular intervals, become more frequent, increase in intensity, or become painful, you should see your health care provider. What are the signs of labor?  Abdominal cramps.  Regular contractions that start at 10 minutes apart and become stronger and more frequent with time.  Contractions that start on the top of the uterus and spread down to the lower abdomen and back.  Increased pelvic pressure and dull back pain.  A watery or bloody mucus discharge that comes from the vagina.  Leaking of amniotic fluid. This is also known as your "water breaking." It could be a slow trickle or a gush. Let your health care provider know if it has a color or strange odor. If you have any of these signs, call your health care provider right away, even if it is before your due date. Follow these instructions at home: Medicines  Follow your health care provider's instructions regarding medicine use. Specific medicines may be either safe or unsafe to take during pregnancy.  Take a prenatal vitamin that contains at least 600 micrograms (mcg) of folic acid.  If you develop constipation, try taking a stool softener if your health care provider approves. Eating and drinking   Eat a balanced diet that includes fresh fruits and vegetables, whole grains, good sources of protein such as meat, eggs, or tofu,  and low-fat dairy. Your health care provider will help you determine the amount of weight gain that is right for you.  Avoid raw meat and uncooked cheese. These carry germs that can cause birth defects in the baby.  If you have low calcium intake from food, talk to your health care provider about whether you should take a daily calcium supplement.  Eat four or five small meals rather than three large meals a day.  Limit foods that are high in fat and processed sugars, such as fried and sweet foods.  To prevent constipation: ? Drink enough fluid to keep your urine clear or pale yellow. ? Eat foods that are high in fiber, such as fresh fruits and vegetables, whole grains, and beans. Activity  Exercise only as directed by your health care provider. Most women can continue their usual exercise routine during pregnancy. Try to exercise for 30 minutes at least 5 days a week. Stop exercising if you experience uterine contractions.  Avoid heavy lifting.  Do   not exercise in extreme heat or humidity, or at high altitudes.  Wear low-heel, comfortable shoes.  Practice good posture.  You may continue to have sex unless your health care provider tells you otherwise. Relieving pain and discomfort  Take frequent breaks and rest with your legs elevated if you have leg cramps or low back pain.  Take warm sitz baths to soothe any pain or discomfort caused by hemorrhoids. Use hemorrhoid cream if your health care provider approves.  Wear a good support bra to prevent discomfort from breast tenderness.  If you develop varicose veins: ? Wear support pantyhose or compression stockings as told by your healthcare provider. ? Elevate your feet for 15 minutes, 3-4 times a day. Prenatal care  Write down your questions. Take them to your prenatal visits.  Keep all your prenatal visits as told by your health care provider. This is important. Safety  Wear your seat belt at all times when driving.  Make  a list of emergency phone numbers, including numbers for family, friends, the hospital, and police and fire departments. General instructions  Avoid cat litter boxes and soil used by cats. These carry germs that can cause birth defects in the baby. If you have a cat, ask someone to clean the litter box for you.  Do not travel far distances unless it is absolutely necessary and only with the approval of your health care provider.  Do not use hot tubs, steam rooms, or saunas.  Do not drink alcohol.  Do not use any products that contain nicotine or tobacco, such as cigarettes and e-cigarettes. If you need help quitting, ask your health care provider.  Do not use any medicinal herbs or unprescribed drugs. These chemicals affect the formation and growth of the baby.  Do not douche or use tampons or scented sanitary pads.  Do not cross your legs for long periods of time.  To prepare for the arrival of your baby: ? Take prenatal classes to understand, practice, and ask questions about labor and delivery. ? Make a trial run to the hospital. ? Visit the hospital and tour the maternity area. ? Arrange for maternity or paternity leave through employers. ? Arrange for family and friends to take care of pets while you are in the hospital. ? Purchase a rear-facing car seat and make sure you know how to install it in your car. ? Pack your hospital bag. ? Prepare the baby's nursery. Make sure to remove all pillows and stuffed animals from the baby's crib to prevent suffocation.  Visit your dentist if you have not gone during your pregnancy. Use a soft toothbrush to brush your teeth and be gentle when you floss. Contact a health care provider if:  You are unsure if you are in labor or if your water has broken.  You become dizzy.  You have mild pelvic cramps, pelvic pressure, or nagging pain in your abdominal area.  You have lower back pain.  You have persistent nausea, vomiting, or  diarrhea.  You have an unusual or bad smelling vaginal discharge.  You have pain when you urinate. Get help right away if:  Your water breaks before 37 weeks.  You have regular contractions less than 5 minutes apart before 37 weeks.  You have a fever.  You are leaking fluid from your vagina.  You have spotting or bleeding from your vagina.  You have severe abdominal pain or cramping.  You have rapid weight loss or weight gain.  You have   shortness of breath with chest pain.  You notice sudden or extreme swelling of your face, hands, ankles, feet, or legs.  Your baby makes fewer than 10 movements in 2 hours.  You have severe headaches that do not go away when you take medicine.  You have vision changes. Summary  The third trimester is from week 28 through week 40, months 7 through 9. The third trimester is a time when the unborn baby (fetus) is growing rapidly.  During the third trimester, your discomfort may increase as you and your baby continue to gain weight. You may have abdominal, leg, and back pain, sleeping problems, and an increased need to urinate.  During the third trimester your breasts will keep growing and they will continue to become tender. A yellow fluid (colostrum) may leak from your breasts. This is the first milk you are producing for your baby.  False labor is a condition in which you feel small, irregular tightenings of the muscles in the womb (contractions) that eventually go away. These are called Braxton Hicks contractions. Contractions may last for hours, days, or even weeks before true labor sets in.  Signs of labor can include: abdominal cramps; regular contractions that start at 10 minutes apart and become stronger and more frequent with time; watery or bloody mucus discharge that comes from the vagina; increased pelvic pressure and dull back pain; and leaking of amniotic fluid. This information is not intended to replace advice given to you by your  health care provider. Make sure you discuss any questions you have with your health care provider. Document Released: 11/12/2001 Document Revised: 12/24/2016 Document Reviewed: 12/24/2016 Elsevier Interactive Patient Education  2019 [employer]vier [employer]  

## 2019-03-30 ENCOUNTER — Encounter: Payer: Self-pay | Admitting: *Deleted

## 2019-03-30 LAB — CBC
Hematocrit: 33.6 % — ABNORMAL LOW (ref 34.0–46.6)
Hemoglobin: 11.4 g/dL (ref 11.1–15.9)
MCH: 30.2 pg (ref 26.6–33.0)
MCHC: 33.9 g/dL (ref 31.5–35.7)
MCV: 89 fL (ref 79–97)
Platelets: 167 10*3/uL (ref 150–450)
RBC: 3.78 x10E6/uL (ref 3.77–5.28)
RDW: 12.5 % (ref 11.7–15.4)
WBC: 10.7 10*3/uL (ref 3.4–10.8)

## 2019-03-30 LAB — ANTIBODY SCREEN: Antibody Screen: NEGATIVE

## 2019-03-30 LAB — RPR: RPR Ser Ql: NONREACTIVE

## 2019-03-30 LAB — GLUCOSE TOLERANCE, 2 HOURS W/ 1HR
Glucose, 1 hour: 127 mg/dL (ref 65–179)
Glucose, 2 hour: 128 mg/dL (ref 65–152)
Glucose, Fasting: 79 mg/dL (ref 65–91)

## 2019-03-30 LAB — HIV ANTIBODY (ROUTINE TESTING W REFLEX): HIV Screen 4th Generation wRfx: NONREACTIVE

## 2019-03-31 ENCOUNTER — Encounter: Payer: Self-pay | Admitting: *Deleted

## 2019-03-31 ENCOUNTER — Telehealth: Payer: Self-pay | Admitting: Obstetrics & Gynecology

## 2019-03-31 NOTE — Telephone Encounter (Signed)
Tried to call patient to let her know I spoke with [employer] and they are going to call her to set up delivery.

## 2019-03-31 NOTE — Telephone Encounter (Signed)
Pt calling to see if her hydroxyprogesterone caproate (MAKENA) 250 mg/mL injection 250 mg   Can be refilled.

## 2019-04-03 DIAGNOSIS — Z3490 Encounter for supervision of normal pregnancy, unspecified, unspecified trimester: Secondary | ICD-10-CM | POA: Diagnosis not present

## 2019-04-27 ENCOUNTER — Ambulatory Visit (INDEPENDENT_AMBULATORY_CARE_PROVIDER_SITE_OTHER): Payer: Medicaid Other | Admitting: Obstetrics and Gynecology

## 2019-04-27 ENCOUNTER — Encounter: Payer: Self-pay | Admitting: Obstetrics and Gynecology

## 2019-04-27 ENCOUNTER — Other Ambulatory Visit: Payer: Self-pay

## 2019-04-27 VITALS — BP 125/90 | HR 124 | Wt 170.0 lb

## 2019-04-27 DIAGNOSIS — Z3009 Encounter for other general counseling and advice on contraception: Secondary | ICD-10-CM

## 2019-04-27 DIAGNOSIS — Z3A32 32 weeks gestation of pregnancy: Secondary | ICD-10-CM | POA: Diagnosis not present

## 2019-04-27 DIAGNOSIS — Z3483 Encounter for supervision of other normal pregnancy, third trimester: Secondary | ICD-10-CM | POA: Diagnosis not present

## 2019-04-27 NOTE — Assessment & Plan Note (Signed)
Tubal papers signed 03/29/2019

## 2019-04-27 NOTE — Progress Notes (Signed)
   TELEHEALTH VIRTUAL OBSTETRICS VISIT ENCOUNTER NOTE  I connected with Rebecca [name] on 04/27/19 at  1:45 PM EDT by telephone at home and verified that I am speaking with the correct person using two identifiers.   I discussed the limitations, risks, security and privacy concerns of performing an evaluation and management service by telephone and the availability of in person appointments. I also discussed with the patient that there may be a patient responsible charge related to this service. The patient expressed understanding and agreed to proceed.  Subjective:  Rebecca [name] is a 33 y.o. G3P1102 at [redacted]w[redacted]d being followed for ongoing prenatal care.  She is currently monitored for the following issues for this low-risk pregnancy and has Depression with anxiety; OAB (overactive bladder); Smoker; Elevated cholesterol; Supervision of normal pregnancy; History of preterm delivery, currently pregnant; Rubella non-immune status, antepartum; and Unwanted fertility on their problem list.  Patient reports Patient is experiencing some insomnia due to family drama is family members are disappointed they cannot see the baby in the hospital.. Reports fetal movement. Denies any contractions, bleeding or leaking of fluid.  Discharge remains stable no different from last pregnancy The following portions of the patient's history were reviewed and updated as appropriate: allergies, current medications, past family history, past medical history, past social history, past surgical history and problem list.   Objective:  Vital signs 125/90 at home facility blood pressures been 90 diastolic.  No headache scotoma or upper abdominal pain General:  Alert, oriented and cooperative.   Mental Status: Normal mood and affect perceived. Normal judgment and thought content.  Rest of physical exam deferred due to type of encounter  Assessment and Plan:  Pregnancy: G3P1102 at [redacted]w[redacted]d  1. Unwanted fertility Tubal  ligation papers signed  2. Encounter for supervision of other normal pregnancy in third trimester 2 weeks visit for telemetry visit  Preterm labor symptoms and general obstetric precautions including but not limited to vaginal bleeding, contractions, leaking of fluid and fetal movement were reviewed in detail with the patient.  I discussed the assessment and treatment plan with the patient. The patient was provided an opportunity to ask questions and all were answered. The patient agreed with the plan and demonstrated an understanding of the instructions. The patient was advised to call back or seek an in-person office evaluation/go to MAU at [facility] for any urgent or concerning symptoms. Please refer to After Visit Summary for other counseling recommendations.   I provided 11 minutes of non-face-to-face time during this encounter.  No follow-ups on file.f/u 2 wk.  No future appointments.  Tilda Burrow, MD Center for [facility], [facility]

## 2019-05-03 ENCOUNTER — Telehealth: Payer: Self-pay | Admitting: *Deleted

## 2019-05-03 NOTE — Telephone Encounter (Signed)
Spoke to patient. States she is feeling more pressure in her vaginal and rectal area.  No contractions, discharge or bleeding noted.  Informed patient the baby could have dropped but to continue to monitor and if she started having any contractions, discharge or bleeding, to let us know or go to [facility]. Verbalized understanding.

## 2019-05-10 ENCOUNTER — Encounter: Payer: Self-pay | Admitting: *Deleted

## 2019-05-11 ENCOUNTER — Ambulatory Visit (INDEPENDENT_AMBULATORY_CARE_PROVIDER_SITE_OTHER): Payer: Medicaid Other | Admitting: Obstetrics & Gynecology

## 2019-05-11 ENCOUNTER — Other Ambulatory Visit: Payer: Self-pay

## 2019-05-11 ENCOUNTER — Encounter: Payer: Self-pay | Admitting: Obstetrics & Gynecology

## 2019-05-11 VITALS — BP 129/86 | HR 93 | Wt 171.0 lb

## 2019-05-11 DIAGNOSIS — Z3483 Encounter for supervision of other normal pregnancy, third trimester: Secondary | ICD-10-CM

## 2019-05-11 DIAGNOSIS — Z3A34 34 weeks gestation of pregnancy: Secondary | ICD-10-CM

## 2019-05-11 NOTE — Progress Notes (Signed)
   Webex VIRTUAL OBSTETRICS VISIT ENCOUNTER NOTE  I connected with Rebecca [name] on 05/11/19 at  9:30 AM EDT by webex at home and verified that I am speaking with the correct person using two identifiers.   I discussed the limitations, risks, security and privacy concerns of performing an evaluation and management service by telephone and the availability of in person appointments. I also discussed with the patient that there may be a patient responsible charge related to this service. The patient expressed understanding and agreed to proceed.  Subjective:  Rebecca [name] is a 33 y.o. G3P1102 at [redacted]w[redacted]d being followed for ongoing prenatal care.  She is currently monitored for the following issues for this low-risk pregnancy and has Depression with anxiety; OAB (overactive bladder); Smoker; Elevated cholesterol; Supervision of normal pregnancy; History of preterm delivery, currently pregnant; Rubella non-immune status, antepartum; and Unwanted fertility on their problem list.  Patient reports no complaints. Reports fetal movement. Denies any contractions, bleeding or leaking of fluid.   The following portions of the patient's history were reviewed and updated as appropriate: allergies, current medications, past family history, past medical history, past social history, past surgical history and problem list.   Objective:   General:  Alert, oriented and cooperative.   Mental Status: Normal mood and affect perceived. Normal judgment and thought content.  Rest of physical exam deferred due to type of encounter  Assessment and Plan:  Pregnancy: G3P1102 at [redacted]w[redacted]d 1. Encounter for supervision of other normal pregnancy in third trimester   Preterm labor symptoms and general obstetric precautions including but not limited to vaginal bleeding, contractions, leaking of fluid and fetal movement were reviewed in detail with the patient.  I discussed the assessment and treatment plan with the  patient. The patient was provided an opportunity to ask questions and all were answered. The patient agreed with the plan and demonstrated an understanding of the instructions. The patient was advised to call back or seek an in-person office evaluation/go to MAU at [facility] for any urgent or concerning symptoms. Please refer to After Visit Summary for other counseling recommendations.   I provided 11 minutes of non-face-to-face time during this encounter.  No follow-ups on file.  Future Appointments  Date Time Provider [place]  05/11/2019  9:30 AM Hala Narula, Mertie Clause, MD [facility]    Florian Buff, MD Center for [facility], [place]

## 2019-05-17 ENCOUNTER — Other Ambulatory Visit: Payer: Self-pay | Admitting: Adult Health

## 2019-05-25 ENCOUNTER — Encounter: Payer: Self-pay | Admitting: Obstetrics and Gynecology

## 2019-05-25 ENCOUNTER — Ambulatory Visit (INDEPENDENT_AMBULATORY_CARE_PROVIDER_SITE_OTHER): Payer: Medicaid Other | Admitting: Obstetrics and Gynecology

## 2019-05-25 ENCOUNTER — Other Ambulatory Visit: Payer: Self-pay

## 2019-05-25 VITALS — BP 123/71 | HR 88 | Wt 161.4 lb

## 2019-05-25 DIAGNOSIS — Z3483 Encounter for supervision of other normal pregnancy, third trimester: Secondary | ICD-10-CM | POA: Diagnosis not present

## 2019-05-25 DIAGNOSIS — Z3A36 36 weeks gestation of pregnancy: Secondary | ICD-10-CM | POA: Diagnosis not present

## 2019-05-25 DIAGNOSIS — Z331 Pregnant state, incidental: Secondary | ICD-10-CM

## 2019-05-25 DIAGNOSIS — Z1389 Encounter for screening for other disorder: Secondary | ICD-10-CM

## 2019-05-25 LAB — POCT URINALYSIS DIPSTICK OB
Blood, UA: NEGATIVE
Glucose, UA: NEGATIVE
Ketones, UA: NEGATIVE
Nitrite, UA: NEGATIVE
POC,PROTEIN,UA: NEGATIVE

## 2019-05-25 NOTE — Progress Notes (Signed)
Patient ID: Rebecca [name], female   DOB: [birth year], 33 y.o.   MRN: [id number]   LOW-RISK PREGNANCY VISIT Patient name: Rebecca [name] MRN [id number]  Date of birth: [birth year] Chief Complaint:   Routine Prenatal Visit (GBS; GC/CHL)  History of Present Illness:   Rebecca [name] is a 33 y.o. [id number] female at 656w6d with an Estimated Date of Delivery: 06/16/19 being seen today for ongoing management of a low-risk pregnancy.  Today she reports no complaints. This is her third child, but her first in 12 years. She has lost around 10 lbs since her last visit. Pt reports some Braxton-Hicks contractions and baby moves around all day. She takes her BP daily at home.   Contractions: Irregular. Vag. Bleeding: None.  Movement: Present. denies leaking of fluid. Review of Systems:   Pertinent items are noted in HPI Denies abnormal vaginal discharge w/ itching/odor/irritation, headaches, visual changes, shortness of breath, chest pain, abdominal pain, severe nausea/vomiting, or problems with urination or bowel movements unless otherwise stated above. Pertinent History Reviewed:  Reviewed past medical,surgical, social, obstetrical and family history.  Reviewed problem list, medications and allergies. Physical Assessment:   Vitals:   05/25/19 1403  BP: 123/71  Pulse: 88  Weight: 161 lb 6.4 oz (73.2 kg)  Body mass index is 31.52 kg/m.        Physical Examination:   General appearance: Well appearing, and in no distress  Mental status: Alert, oriented to person, place, and time  Skin: Warm & dry  Cardiovascular: Normal heart rate noted  Respiratory: Normal respiratory effort, no distress  Abdomen: Soft, gravid, nontender  Pelvic: Cervical exam performed         Extremities: Edema: Trace  Fetal Status:     Movement: Present    Results for orders placed or performed in visit on 05/25/19 (from the past 24 hour(s))  POC Urinalysis Dipstick OB   Collection Time: 05/25/19  2:04 PM  Result  Value Ref Range   Color, UA     Clarity, UA     Glucose, UA Negative Negative   Bilirubin, UA     Ketones, UA neg    Spec Grav, UA     Blood, UA neg    pH, UA     POC,PROTEIN,UA Negative Negative, Trace, Small (1+), Moderate (2+), Large (3+), 4+   Urobilinogen, UA     Nitrite, UA neg    Leukocytes, UA Trace (A) Negative   Appearance     Odor      Assessment & Plan:  1) Low-risk pregnancy [id number] at 656w6d with an Estimated Date of Delivery: 06/16/19    Plan:  Continue routine obstetrical care  Meds: No orders of the defined types were placed in this encounter.  Labs/procedures today: GBS, GC/CHL  Reviewed: Term labor symptoms and general obstetric precautions including but not limited to vaginal bleeding, contractions, leaking of fluid and fetal movement were reviewed in detail with the patient.  All questions were answered  Follow-up: Return in about 1 week (around 06/01/2019) for LROB.  Orders Placed This Encounter  Procedures  . GC/Chlamydia Probe Amp  . Strep Gp B NAA+Rflx  . POC Urinalysis Dipstick OB   By signing my name below, I, Pietro CassisEmily Tufford, attest that this documentation has been prepared under the direction and in the presence of Tilda BurrowFerguson, Sherice Ijames V, MD. Electronically Signed: Pietro CassisEmily Tufford, Medical Scribe. 05/25/19. 2:32 PM.  I personally performed the services described in this documentation, which was SCRIBED in my  presence. The recorded information has been reviewed and considered accurate. It has been edited as necessary during review. Jonnie Kind, MD

## 2019-05-27 LAB — GC/CHLAMYDIA PROBE AMP
Chlamydia trachomatis, NAA: NEGATIVE
Neisseria Gonorrhoeae by PCR: NEGATIVE

## 2019-05-27 LAB — STREP GP B NAA+RFLX: Strep Gp B NAA+Rflx: NEGATIVE

## 2019-05-31 ENCOUNTER — Encounter: Payer: Self-pay | Admitting: *Deleted

## 2019-06-01 ENCOUNTER — Ambulatory Visit (INDEPENDENT_AMBULATORY_CARE_PROVIDER_SITE_OTHER): Payer: Medicaid Other | Admitting: Obstetrics & Gynecology

## 2019-06-01 ENCOUNTER — Other Ambulatory Visit: Payer: Self-pay

## 2019-06-01 VITALS — BP 125/81 | HR 88 | Wt 161.0 lb

## 2019-06-01 DIAGNOSIS — Z331 Pregnant state, incidental: Secondary | ICD-10-CM

## 2019-06-01 DIAGNOSIS — Z3A37 37 weeks gestation of pregnancy: Secondary | ICD-10-CM

## 2019-06-01 DIAGNOSIS — Z3483 Encounter for supervision of other normal pregnancy, third trimester: Secondary | ICD-10-CM

## 2019-06-01 DIAGNOSIS — Z1389 Encounter for screening for other disorder: Secondary | ICD-10-CM

## 2019-06-01 LAB — POCT URINALYSIS DIPSTICK OB
Blood, UA: NEGATIVE
Glucose, UA: NEGATIVE
Ketones, UA: NEGATIVE
Leukocytes, UA: NEGATIVE
Nitrite, UA: NEGATIVE
POC,PROTEIN,UA: NEGATIVE

## 2019-06-01 NOTE — Progress Notes (Signed)
   LOW-RISK PREGNANCY VISIT Patient name: Rebecca [name] MRN [id number]  Date of birth: 1986-09-28 Chief Complaint:   Routine Prenatal Visit  History of Present Illness:   Rebecca [name] is a 33 y.o. [id number] female at [redacted]w[redacted]d with an Estimated Date of Delivery: 06/16/19 being seen today for ongoing management of a low-risk pregnancy.  Today she reports no complaints. Contractions: Irregular. Vag. Bleeding: None.  Movement: Present. denies leaking of fluid. Review of Systems:   Pertinent items are noted in HPI Denies abnormal vaginal discharge w/ itching/odor/irritation, headaches, visual changes, shortness of breath, chest pain, abdominal pain, severe nausea/vomiting, or problems with urination or bowel movements unless otherwise stated above. Pertinent History Reviewed:  Reviewed past medical,surgical, social, obstetrical and family history.  Reviewed problem list, medications and allergies. Physical Assessment:   Vitals:   06/01/19 1419  BP: 125/81  Pulse: 88  Weight: 161 lb (73 kg)  Body mass index is 31.44 kg/m.        Physical Examination:   General appearance: Well appearing, and in no distress  Mental status: Alert, oriented to person, place, and time  Skin: Warm & dry  Cardiovascular: Normal heart rate noted  Respiratory: Normal respiratory effort, no distress  Abdomen: Soft, gravid, nontender  Pelvic: Cervical exam performed  Dilation: Fingertip Effacement (%): 20 Station: -3  Extremities: Edema: Trace  Fetal Status: Fetal Heart Rate (bpm): 133 Fundal Height: 35 cm Movement: Present    Results for orders placed or performed in visit on 06/01/19 (from the past 24 hour(s))  POC Urinalysis Dipstick OB   Collection Time: 06/01/19  2:24 PM  Result Value Ref Range   Color, UA     Clarity, UA     Glucose, UA Negative Negative   Bilirubin, UA     Ketones, UA neg    Spec Grav, UA     Blood, UA neg    pH, UA     POC,PROTEIN,UA Negative Negative, Trace, Small (1+),  Moderate (2+), Large (3+), 4+   Urobilinogen, UA     Nitrite, UA neg    Leukocytes, UA Negative Negative   Appearance     Odor      Assessment & Plan:  1) Low-risk pregnancy [id number] at [redacted]w[redacted]d with an Estimated Date of Delivery: 06/16/19      Meds: No orders of the defined types were placed in this encounter.  Labs/procedures today:   Plan:  Continue routine obstetrical care   Reviewed: Term labor symptoms and general obstetric precautions including but not limited to vaginal bleeding, contractions, leaking of fluid and fetal movement were reviewed in detail with the patient.  All questions were answered.  home bp cuff. Rx faxed to . Check bp weekly, let us know if >140/90.   Follow-up: Return in about 1 week (around 06/08/2019) for [place].  Orders Placed This Encounter  Procedures  . POC Urinalysis Dipstick OB   Florian Buff  06/01/2019 2:46 PM

## 2019-06-08 ENCOUNTER — Other Ambulatory Visit: Payer: Self-pay

## 2019-06-08 ENCOUNTER — Ambulatory Visit (INDEPENDENT_AMBULATORY_CARE_PROVIDER_SITE_OTHER): Payer: Medicaid Other | Admitting: Obstetrics & Gynecology

## 2019-06-08 ENCOUNTER — Encounter: Payer: Self-pay | Admitting: Obstetrics & Gynecology

## 2019-06-08 VITALS — BP 132/76 | HR 88 | Wt 161.5 lb

## 2019-06-08 DIAGNOSIS — Z3483 Encounter for supervision of other normal pregnancy, third trimester: Secondary | ICD-10-CM

## 2019-06-08 DIAGNOSIS — Z3A38 38 weeks gestation of pregnancy: Secondary | ICD-10-CM

## 2019-06-08 DIAGNOSIS — Z331 Pregnant state, incidental: Secondary | ICD-10-CM

## 2019-06-08 DIAGNOSIS — Z1389 Encounter for screening for other disorder: Secondary | ICD-10-CM

## 2019-06-08 LAB — POCT URINALYSIS DIPSTICK OB
Blood, UA: NEGATIVE
Glucose, UA: NEGATIVE
Ketones, UA: NEGATIVE
Leukocytes, UA: NEGATIVE
Nitrite, UA: NEGATIVE
POC,PROTEIN,UA: NEGATIVE

## 2019-06-08 NOTE — Progress Notes (Signed)
   LOW-RISK PREGNANCY VISIT Patient name: Rebecca [name] MRN [id number]  Date of birth: [birth year] Chief Complaint:   Routine Prenatal Visit (+ diarrhea)  History of Present Illness:   Rebecca [name] is a 33 y.o. [id number] female at [redacted]w[redacted]d with an Estimated Date of Delivery: 06/16/19 being seen today for ongoing management of a low-risk pregnancy.  Today she reports no complaints. Contractions: Irregular. Vag. Bleeding: None.  Movement: Present. denies leaking of fluid. Review of Systems:   Pertinent items are noted in HPI Denies abnormal vaginal discharge w/ itching/odor/irritation, headaches, visual changes, shortness of breath, chest pain, abdominal pain, severe nausea/vomiting, or problems with urination or bowel movements unless otherwise stated above. Pertinent History Reviewed:  Reviewed past medical,surgical, social, obstetrical and family history.  Reviewed problem list, medications and allergies. Physical Assessment:   Vitals:   06/08/19 1210  BP: 132/76  Pulse: 88  Weight: 161 lb 8 oz (73.3 kg)  Body mass index is 31.54 kg/m.        Physical Examination:   General appearance: Well appearing, and in no distress  Mental status: Alert, oriented to person, place, and time  Skin: Warm & dry  Cardiovascular: Normal heart rate noted  Respiratory: Normal respiratory effort, no distress  Abdomen: Soft, gravid, nontender  Pelvic: Cervical exam performed  Dilation: 2 Effacement (%): 20 Station: -2  Extremities: Edema: None  Fetal Status: Fetal Heart Rate (bpm): 150 Fundal Height: 35 cm Movement: Present Presentation: Vertex  Results for orders placed or performed in visit on 06/08/19 (from the past 24 hour(s))  POC Urinalysis Dipstick OB   Collection Time: 06/08/19 12:12 PM  Result Value Ref Range   Color, UA     Clarity, UA     Glucose, UA Negative Negative   Bilirubin, UA     Ketones, UA neg    Spec Grav, UA     Blood, UA neg    pH, UA     POC,PROTEIN,UA Negative  Negative, Trace, Small (1+), Moderate (2+), Large (3+), 4+   Urobilinogen, UA     Nitrite, UA neg    Leukocytes, UA Negative Negative   Appearance     Odor      Assessment & Plan:  1) Low-risk pregnancy [id number] at [redacted]w[redacted]d with an Estimated Date of Delivery: 06/16/19      Meds: No orders of the defined types were placed in this encounter.  Labs/procedures today:   Plan:  Continue routine obstetrical care   Reviewed: Term labor symptoms and general obstetric precautions including but not limited to vaginal bleeding, contractions, leaking of fluid and fetal movement were reviewed in detail with the patient.  All questions were answered.  home bp cuff. Rx faxed to . Check bp weekly, let us know if >140/90.   Follow-up: Return in about 1 week (around 06/15/2019) for [place].  Orders Placed This Encounter  Procedures  . POC Urinalysis Dipstick OB   Florian Buff  06/08/2019 12:18 PM

## 2019-06-17 ENCOUNTER — Ambulatory Visit (INDEPENDENT_AMBULATORY_CARE_PROVIDER_SITE_OTHER): Payer: Medicaid Other | Admitting: Advanced Practice Midwife

## 2019-06-17 ENCOUNTER — Telehealth (HOSPITAL_COMMUNITY): Payer: Self-pay | Admitting: *Deleted

## 2019-06-17 ENCOUNTER — Other Ambulatory Visit: Payer: Self-pay

## 2019-06-17 ENCOUNTER — Encounter: Payer: Self-pay | Admitting: Advanced Practice Midwife

## 2019-06-17 VITALS — BP 112/80 | HR 104 | Wt 162.0 lb

## 2019-06-17 DIAGNOSIS — Z3A40 40 weeks gestation of pregnancy: Secondary | ICD-10-CM | POA: Diagnosis not present

## 2019-06-17 DIAGNOSIS — Z331 Pregnant state, incidental: Secondary | ICD-10-CM

## 2019-06-17 DIAGNOSIS — O480 Post-term pregnancy: Secondary | ICD-10-CM

## 2019-06-17 DIAGNOSIS — Z3483 Encounter for supervision of other normal pregnancy, third trimester: Secondary | ICD-10-CM

## 2019-06-17 DIAGNOSIS — Z1389 Encounter for screening for other disorder: Secondary | ICD-10-CM

## 2019-06-17 LAB — POCT URINALYSIS DIPSTICK OB
Blood, UA: NEGATIVE
Glucose, UA: NEGATIVE
Ketones, UA: NEGATIVE
Leukocytes, UA: NEGATIVE
Nitrite, UA: NEGATIVE
POC,PROTEIN,UA: NEGATIVE

## 2019-06-17 NOTE — Progress Notes (Signed)
   Induction Assessment Scheduling Form: [facility]ax to [facility]:  [phone]  LILEY RAKE                                                                                   DOB:  05-11-1986                                                            MRN:  [id number]                                                                     Phone #:   [phone]                         Provider:  [facility]  GP:  [id number]                                                            Estimated Date of Delivery: 06/16/19  Dating Criteria: [place]    Medical Indications for induction:  postdates Admission Date/Time:  7/22 Gestational age on admission:  0730   There were no vitals filed for this visit. HIV:  Non Reactive (04/27 0914) GBS:   neg    Method of induction(proposed):  piticon   Scheduling Provider Signature:  Christin Fudge, CNM                                            Today's Date:  06/17/2019

## 2019-06-17 NOTE — Progress Notes (Signed)
LOW-RISK PREGNANCY VISIT Patient name: Rebecca [name] MRN [id number]  Date of birth: [birth year] Chief Complaint:   Routine Prenatal Visit  History of Present Illness:   Rebecca [name] is a 33 y.o. [id number] female at 3972w1d with an Estimated Date of Delivery: 06/16/19 being seen today for ongoing management of a low-risk pregnancy.  Today she reports no complaints. Contractions: Irregular. Vag. Bleeding: None.  Movement: Present. denies leaking of fluid. Review of Systems:   Pertinent items are noted in HPI Denies abnormal vaginal discharge w/ itching/odor/irritation, headaches, visual changes, shortness of breath, chest pain, abdominal pain, severe nausea/vomiting, or problems with urination or bowel movements unless otherwise stated above.  Pertinent History Reviewed:  Medical & Surgical Hx:   Past Medical History:  Diagnosis Date  . Anxiety   . Back pain   . Boil 05/14/2016  . Breast pain 05/11/2015  . Contraceptive management 08/30/2013  . GERD (gastroesophageal reflux disease)   . LLQ pain 11/04/2014  . OAB (overactive bladder) 04/20/2013  . Reflux   . Smoker 05/14/2016  . Soreness breast 10/25/2013   Left breast sore UOQ, no masses  . Urinary frequency 11/04/2014   Past Surgical History:  Procedure Laterality Date  . NO PAST SURGERIES     Family History  Problem Relation Age of Onset  . Cancer Maternal Grandmother   . Cancer Maternal Grandfather   . CAD Father   . Stroke Father   . Liver disease Father   . Alcohol abuse Father   . Other Mother        brain tumor  . Asthma Son   . Bronchitis Son   . ADD / ADHD Son     Current Outpatient Medications:  .  Acetaminophen (TYLENOL PO), Take by mouth as needed., Disp: , Rfl:  .  doxylamine, Sleep, (UNISOM) 25 MG tablet, Take 25 mg by mouth at bedtime as needed., Disp: , Rfl:  .  omeprazole (PRILOSEC) 20 MG capsule, TAKE 1 CAPSULE BY MOUTH EVERY DAY AS NEEDED, Disp: 30 capsule, Rfl: 3 .  Prenatal Vit-Fe Fumarate-FA  (PNV PRENATAL PLUS MULTIVITAMIN) 27-1 MG TABS, Take 1 tablet by mouth daily., Disp: 30 tablet, Rfl: 11  Current Facility-Administered Medications:  .  HYDROXYprogesterone Caproate SOAJ 275 mg, 275 mg, Subcutaneous, Once, Eure, Amaryllis DykeLuther H, MD Social History: Reviewed -  reports that she has been smoking cigarettes. She has a 15.00 pack-year smoking history. She has never used smokeless tobacco.  Physical Assessment:   Vitals:   06/17/19 0935  BP: 112/80  Pulse: (!) 104  Weight: 162 lb (73.5 kg)  Body mass index is 31.64 kg/m.        Physical Examination:   General appearance: Well appearing, and in no distress  Mental status: Alert, oriented to person, place, and time  Skin: Warm & dry  Cardiovascular: Normal heart rate noted  Respiratory: Normal respiratory effort, no distress  Abdomen: Soft, gravid, nontender  Pelvic: Cervical exam performed  Dilation: 3 Effacement (%): 50 Station: -2  Extremities: Edema: None  Fetal Status: Fetal Heart Rate (bpm): 145   Movement: Present Presentation: Vertex NST: FHR baseline 145 bpm, Variability: moderate, Accelerations:present, Decelerations:  Absent= Cat 1/Reactive   Results for orders placed or performed in visit on 06/17/19 (from the past 24 hour(s))  POC Urinalysis Dipstick OB   Collection Time: 06/17/19  9:34 AM  Result Value Ref Range   Color, UA     Clarity, UA     Glucose, UA Negative  Negative   Bilirubin, UA     Ketones, UA neg    Spec Grav, UA     Blood, UA neg    pH, UA     POC,PROTEIN,UA Negative Negative, Trace, Small (1+), Moderate (2+), Large (3+), 4+   Urobilinogen, UA     Nitrite, UA neg    Leukocytes, UA Negative Negative   Appearance     Odor      Assessment & Plan:  1) Low-risk pregnancy [id number] at [redacted]w[redacted]d with an Estimated Date of Delivery: 06/16/19   2) postdates, IOL scheduled for 06/23/19 at 0730   Labs/procedures/US today: NST  Plan:  Continue routine obstetrical care    Follow-up: Return in about 6  weeks (around 07/29/2019) for postpartum checkup.  Orders Placed This Encounter  Procedures  . POC Urinalysis Dipstick OB   Christin Fudge CNM 06/17/2019 10:20 AM

## 2019-06-17 NOTE — Telephone Encounter (Signed)
Preadmission screen  

## 2019-06-17 NOTE — Patient Instructions (Signed)
If you are still pregnant on 06/23/19 at 7:30 am, come to [facility] at [facility], ([address], [place] in [place], [place]) to start your induction!  Eat a light meal before you come.  Rebecca [name]!!

## 2019-06-18 ENCOUNTER — Telehealth (HOSPITAL_COMMUNITY): Payer: Self-pay | Admitting: *Deleted

## 2019-06-18 ENCOUNTER — Encounter (HOSPITAL_COMMUNITY): Payer: Self-pay | Admitting: *Deleted

## 2019-06-18 NOTE — Telephone Encounter (Signed)
Preadmission screen  

## 2019-06-21 ENCOUNTER — Other Ambulatory Visit (HOSPITAL_COMMUNITY)
Admission: RE | Admit: 2019-06-21 | Discharge: 2019-06-21 | Disposition: A | Discharge status: Home / Self Care | Payer: Medicaid Other | Source: Ambulatory Visit | Attending: Obstetrics & Gynecology | Admitting: Obstetrics & Gynecology

## 2019-06-21 ENCOUNTER — Other Ambulatory Visit: Payer: Self-pay

## 2019-06-21 DIAGNOSIS — Z1159 Encounter for screening for other viral diseases: Secondary | ICD-10-CM | POA: Diagnosis not present

## 2019-06-21 LAB — SARS CORONAVIRUS 2 BY RT PCR (HOSPITAL ORDER, PERFORMED IN ~~LOC~~ HOSPITAL LAB): SARS Coronavirus 2: NEGATIVE

## 2019-06-23 ENCOUNTER — Encounter (HOSPITAL_COMMUNITY): Payer: Self-pay | Admitting: *Deleted

## 2019-06-23 ENCOUNTER — Inpatient Hospital Stay (HOSPITAL_COMMUNITY)
Admission: AD | Admit: 2019-06-23 | Discharge: 2019-06-24 | DRG: 807 | Disposition: A | Discharge status: Home / Self Care | Payer: Medicaid Other | Attending: Obstetrics and Gynecology | Admitting: Obstetrics and Gynecology

## 2019-06-23 ENCOUNTER — Inpatient Hospital Stay (HOSPITAL_COMMUNITY): Payer: Medicaid Other

## 2019-06-23 DIAGNOSIS — O99344 Other mental disorders complicating childbirth: Secondary | ICD-10-CM | POA: Diagnosis present

## 2019-06-23 DIAGNOSIS — Z283 Underimmunization status: Secondary | ICD-10-CM

## 2019-06-23 DIAGNOSIS — K219 Gastro-esophageal reflux disease without esophagitis: Secondary | ICD-10-CM | POA: Diagnosis present

## 2019-06-23 DIAGNOSIS — O99334 Smoking (tobacco) complicating childbirth: Secondary | ICD-10-CM | POA: Diagnosis present

## 2019-06-23 DIAGNOSIS — F418 Other specified anxiety disorders: Secondary | ICD-10-CM | POA: Diagnosis present

## 2019-06-23 DIAGNOSIS — Z3A41 41 weeks gestation of pregnancy: Secondary | ICD-10-CM | POA: Diagnosis not present

## 2019-06-23 DIAGNOSIS — O9962 Diseases of the digestive system complicating childbirth: Secondary | ICD-10-CM | POA: Diagnosis present

## 2019-06-23 DIAGNOSIS — Z3490 Encounter for supervision of normal pregnancy, unspecified, unspecified trimester: Secondary | ICD-10-CM

## 2019-06-23 DIAGNOSIS — O480 Post-term pregnancy: Secondary | ICD-10-CM | POA: Diagnosis not present

## 2019-06-23 DIAGNOSIS — F17200 Nicotine dependence, unspecified, uncomplicated: Secondary | ICD-10-CM | POA: Diagnosis present

## 2019-06-23 DIAGNOSIS — O09899 Supervision of other high risk pregnancies, unspecified trimester: Secondary | ICD-10-CM

## 2019-06-23 DIAGNOSIS — F17210 Nicotine dependence, cigarettes, uncomplicated: Secondary | ICD-10-CM | POA: Diagnosis present

## 2019-06-23 LAB — TYPE AND SCREEN
ABO/RH(D): O POS
Antibody Screen: NEGATIVE

## 2019-06-23 LAB — CBC
HCT: 34.4 % — ABNORMAL LOW (ref 36.0–46.0)
Hemoglobin: 11.5 g/dL — ABNORMAL LOW (ref 12.0–15.0)
MCH: 29.8 pg (ref 26.0–34.0)
MCHC: 33.4 g/dL (ref 30.0–36.0)
MCV: 89.1 fL (ref 80.0–100.0)
Platelets: 139 10*3/uL — ABNORMAL LOW (ref 150–400)
RBC: 3.86 MIL/uL — ABNORMAL LOW (ref 3.87–5.11)
RDW: 14.3 % (ref 11.5–15.5)
WBC: 10.9 10*3/uL — ABNORMAL HIGH (ref 4.0–10.5)
nRBC: 0 % (ref 0.0–0.2)

## 2019-06-23 LAB — ABO/RH: ABO/RH(D): O POS

## 2019-06-23 LAB — RPR: RPR Ser Ql: NONREACTIVE

## 2019-06-23 MED ORDER — OXYCODONE-ACETAMINOPHEN 5-325 MG PO TABS: 1.0000 | ORAL_TABLET | ORAL | Status: DC | PRN

## 2019-06-23 MED ORDER — OXYCODONE-ACETAMINOPHEN 5-325 MG PO TABS: 2.0000 | ORAL_TABLET | ORAL | Status: DC | PRN

## 2019-06-23 MED ORDER — FENTANYL CITRATE (PF) 100 MCG/2ML IJ SOLN
50.0000 ug | INTRAMUSCULAR | Status: DC | PRN
  Administered 2019-06-23: 100 ug via INTRAVENOUS
  Filled 2019-06-23: qty 2

## 2019-06-23 MED ORDER — TETANUS-DIPHTH-ACELL PERTUSSIS 5-2.5-18.5 LF-MCG/0.5 IM SUSP: 0.5000 mL | Freq: Once | INTRAMUSCULAR | Status: DC

## 2019-06-23 MED ORDER — LACTATED RINGERS IV SOLN: 500.0000 mL | INTRAVENOUS | Status: DC | PRN

## 2019-06-23 MED ORDER — TERBUTALINE SULFATE 1 MG/ML IJ SOLN: 0.2500 mg | Freq: Once | INTRAMUSCULAR | Status: DC | PRN

## 2019-06-23 MED ORDER — IBUPROFEN 800 MG PO TABS
800.0000 mg | ORAL_TABLET | Freq: Three times a day (TID) | ORAL | Status: DC
  Administered 2019-06-23 – 2019-06-24 (×3): 800 mg via ORAL
  Filled 2019-06-23 (×3): qty 1

## 2019-06-23 MED ORDER — SENNOSIDES-DOCUSATE SODIUM 8.6-50 MG PO TABS
2.0000 | ORAL_TABLET | ORAL | Status: DC
  Administered 2019-06-24: 2 via ORAL
  Filled 2019-06-23: qty 2

## 2019-06-23 MED ORDER — DIBUCAINE (PERIANAL) 1 % EX OINT: 1.0000 "application " | TOPICAL_OINTMENT | CUTANEOUS | Status: DC | PRN

## 2019-06-23 MED ORDER — ONDANSETRON HCL 4 MG PO TABS: 4.0000 mg | ORAL_TABLET | ORAL | Status: DC | PRN

## 2019-06-23 MED ORDER — BENZOCAINE-MENTHOL 20-0.5 % EX AERO
1.0000 "application " | INHALATION_SPRAY | CUTANEOUS | Status: DC | PRN
  Administered 2019-06-23: 1 via TOPICAL
  Filled 2019-06-23: qty 56

## 2019-06-23 MED ORDER — ONDANSETRON HCL 4 MG/2ML IJ SOLN: 4.0000 mg | INTRAMUSCULAR | Status: DC | PRN

## 2019-06-23 MED ORDER — MEDROXYPROGESTERONE ACETATE 150 MG/ML IM SUSP
150.0000 mg | Freq: Once | INTRAMUSCULAR | Status: AC
  Administered 2019-06-24: 150 mg via INTRAMUSCULAR
  Filled 2019-06-23: qty 1

## 2019-06-23 MED ORDER — HYDROXYZINE HCL 50 MG PO TABS: 50.0000 mg | ORAL_TABLET | Freq: Four times a day (QID) | ORAL | Status: DC | PRN

## 2019-06-23 MED ORDER — OXYTOCIN BOLUS FROM INFUSION
500.0000 mL | Freq: Once | INTRAVENOUS | Status: AC
  Administered 2019-06-23: 15:00:00 500 mL via INTRAVENOUS

## 2019-06-23 MED ORDER — DIPHENHYDRAMINE HCL 25 MG PO CAPS: 25.0000 mg | ORAL_CAPSULE | Freq: Four times a day (QID) | ORAL | Status: DC | PRN

## 2019-06-23 MED ORDER — WITCH HAZEL-GLYCERIN EX PADS: 1.0000 "application " | MEDICATED_PAD | CUTANEOUS | Status: DC | PRN

## 2019-06-23 MED ORDER — ONDANSETRON HCL 4 MG/2ML IJ SOLN: 4.0000 mg | Freq: Four times a day (QID) | INTRAMUSCULAR | Status: DC | PRN

## 2019-06-23 MED ORDER — ACETAMINOPHEN 325 MG PO TABS
650.0000 mg | ORAL_TABLET | ORAL | Status: DC | PRN
  Administered 2019-06-23 – 2019-06-24 (×2): 650 mg via ORAL
  Filled 2019-06-23 (×2): qty 2

## 2019-06-23 MED ORDER — LIDOCAINE HCL (PF) 1 % IJ SOLN
30.0000 mL | INTRAMUSCULAR | Status: AC | PRN
  Administered 2019-06-23: 30 mL via SUBCUTANEOUS
  Filled 2019-06-23: qty 30

## 2019-06-23 MED ORDER — ZOLPIDEM TARTRATE 5 MG PO TABS: 5.0000 mg | ORAL_TABLET | Freq: Every evening | ORAL | Status: DC | PRN

## 2019-06-23 MED ORDER — MISOPROSTOL 25 MCG QUARTER TABLET: 25.0000 ug | ORAL_TABLET | ORAL | Status: DC | PRN

## 2019-06-23 MED ORDER — MEASLES, MUMPS & RUBELLA VAC IJ SOLR
0.5000 mL | Freq: Once | INTRAMUSCULAR | Status: AC
  Administered 2019-06-24: 14:00:00 0.5 mL via SUBCUTANEOUS
  Filled 2019-06-23: qty 0.5

## 2019-06-23 MED ORDER — FLEET ENEMA 7-19 GM/118ML RE ENEM: 1.0000 | ENEMA | RECTAL | Status: DC | PRN

## 2019-06-23 MED ORDER — COCONUT OIL OIL: 1.0000 "application " | TOPICAL_OIL | Status: DC | PRN

## 2019-06-23 MED ORDER — SOD CITRATE-CITRIC ACID 500-334 MG/5ML PO SOLN: 30.0000 mL | ORAL | Status: DC | PRN

## 2019-06-23 MED ORDER — ACETAMINOPHEN 325 MG PO TABS: 650.0000 mg | ORAL_TABLET | ORAL | Status: DC | PRN

## 2019-06-23 MED ORDER — OXYTOCIN 40 UNITS IN NORMAL SALINE INFUSION - SIMPLE MED
1.0000 m[IU]/min | INTRAVENOUS | Status: DC
  Administered 2019-06-23: 2 m[IU]/min via INTRAVENOUS
  Filled 2019-06-23: qty 1000

## 2019-06-23 MED ORDER — SIMETHICONE 80 MG PO CHEW: 80.0000 mg | CHEWABLE_TABLET | ORAL | Status: DC | PRN

## 2019-06-23 MED ORDER — LACTATED RINGERS IV SOLN
INTRAVENOUS | Status: DC
  Administered 2019-06-23 (×2): via INTRAVENOUS

## 2019-06-23 MED ORDER — PRENATAL MULTIVITAMIN CH
1.0000 | ORAL_TABLET | Freq: Every day | ORAL | Status: DC
  Administered 2019-06-24: 1 via ORAL
  Filled 2019-06-23: qty 1

## 2019-06-23 MED ORDER — OXYTOCIN 40 UNITS IN NORMAL SALINE INFUSION - SIMPLE MED: 2.5000 [IU]/h | INTRAVENOUS | Status: DC

## 2019-06-23 NOTE — Discharge Summary (Signed)
Obstetrics Discharge Summary [facility]   Patient Name: Rebecca [name] DOB: 10/24/1986 MRN: [id number]  Date of admission: 06/23/2019 Delivering MD: AUTRY-LOTT, SIMONE   Date of discharge: 06/24/2019  Admitting diagnosis: pregnancy Intrauterine pregnancy: [redacted]w[redacted]d     Secondary diagnosis:   Principal Problem:   Post-dates pregnancy Active Problems:   Depression with anxiety   Smoker   History of preterm delivery, currently pregnant   Rubella non-immune status, antepartum    Discharge diagnosis: Term Pregnancy Delivered                                            Postpartum procedures: Depo  MMR Complications: 1st degree perineal laceration  bilateral labial lacerations  Outpatient Follow-Up: [ ] depo, continue to counsel on interval BTL (papers signed 03/29/19) [ ] mood check - history of anxiety and depression, stopped Lexapro  [ ] ensure MMR given   Hospital course: Rebecca [name] is a 32 y.o. [redacted]w[redacted]d who was admitted for post-dates induction of labor. Her pregnancy was complicated by depression/anxiety, tobacco use, rubella non-immune status. Her labor course was notable for induction with pitocin, AROM just prior to delivery. Delivery was complicated by meconium-stained fluid, 1st degree perineal laceration repaired. Please see delivery/op note for additional details. Her postpartum course was uncomplicated. She was bottlefeeding. By day of discharge, she was passing flatus, urinating, eating and drinking without difficulty. Her pain was well-controlled, and she was discharged home with ibuprofen. She will follow-up in clinic in 4-6 weeks.   Physical exam  Vitals:   06/23/19 1652 06/23/19 2151 06/24/19 0210 06/24/19 0601  BP: 112/77 (!) 109/59 111/60 124/79  Pulse: 85 87 62 68  Resp: 18  16 18  Temp: 99.4 F (37.4 C) 98.4 F (36.9 C) 98 F (36.7 C) 97.6 F (36.4 C)  TempSrc: Oral Oral Oral Oral  SpO2: 100% 100%    Weight:      Height:       General:  well-appearing, NAD Lochia: appropriate Uterine Fundus: firm Incision: N/A DVT Evaluation: No significant calf/ankle edema.  Labs: Lab Results  Component Value Date   WBC 10.9 (H) 06/23/2019   HGB 11.5 (L) 06/23/2019   HCT 34.4 (L) 06/23/2019   MCV 89.1 06/23/2019   PLT 139 (L) 06/23/2019   CMP Latest Ref Rng & Units 04/11/2018  Glucose 65 - 99 mg/dL 99  BUN 6 - 20 mg/dL 6  Creatinine 0.44 - 1.00 mg/dL 0.67  Sodium 135 - 145 mmol/L 136  Potassium 3.5 - 5.1 mmol/L 3.5  Chloride 101 - 111 mmol/L 104  CO2 22 - 32 mmol/L 25  Calcium 8.9 - 10.3 mg/dL 9.1  Total Protein 6.5 - 8.1 g/dL 6.5  Total Bilirubin 0.3 - 1.2 mg/dL 0.6  Alkaline Phos 38 - 126 U/L 107  AST 15 - 41 U/L 28  ALT 14 - 54 U/L 43    Discharge instructions: Per After Visit Summary and "Baby and Me Booklet"  After visit meds:  Allergies as of 06/24/2019      Reactions   Amoxicillin Hives   Cephalosporins Nausea And Vomiting   Septra [bactrim] Nausea And Vomiting   Ciprocinonide [fluocinolone] Nausea And Vomiting   Penicillins Hives      Medication List    TAKE these medications   doxylamine (Sleep) 25 MG tablet Commonly known as: UNISOM Take 25 mg by   mouth at bedtime as needed.   ibuprofen 800 MG tablet Commonly known as: ADVIL Take 1 tablet (800 mg total) by mouth every 8 (eight) hours.   omeprazole 20 MG capsule Commonly known as: PRILOSEC TAKE 1 CAPSULE BY MOUTH EVERY DAY AS NEEDED   PNV Prenatal Plus Multivitamin 27-1 MG Tabs Take 1 tablet by mouth daily.   TYLENOL PO Take by mouth as needed.       Postpartum contraception: Vasectomy and Depo before discharge Diet: Routine Diet Activity: Advance as tolerated. Pelvic rest for 6 weeks.   Follow-up Appt: Future Appointments  Date Time Provider [place]  07/28/2019  1:45 PM Cresenzo-Dishmon, Joaquim Lai, CNM [facility]  Please schedule this patient for Postpartum visit in: 4 weeks with the following provider: Any provider Low  risk pregnancy complicated by: depression/anxiety Delivery mode:  SVD Anticipated Birth Control:  Depo PP Procedures needed: none  Schedule Integrated BH visit: yes  Newborn Data: Live born female  Birth Weight:  2785g APGAR: 44, 9  Newborn Delivery   Birth date/time: 06/23/2019 14:26:00 Delivery type: Vaginal, Spontaneous      Baby Feeding: Bottle Disposition:home with mother   Lambert Mody. Juleen China, DO OB/GYN Fellow

## 2019-06-23 NOTE — Progress Notes (Signed)
LABOR PROGRESS NOTE  Rebecca [name] is a 33 y.o. [id number] at [redacted]w[redacted]d  admitted for IOL for post dates.  Subjective: She is doing well, ambulating around the room, utilizing the egg ball. She is experiencing moderate pain with contractions.   Objective: BP 132/83   Pulse 76   Resp 18   Ht 5' (1.524 m)   Wt 73.2 kg   LMP 09/09/2018   BMI 31.50 kg/m  or  Vitals:   06/23/19 1030 06/23/19 1135 06/23/19 1200 06/23/19 1230  BP: 115/77 120/76 136/88 132/83  Pulse: 70 71 80 76  Resp: 18 18  18   Weight:      Height:         Dilation: 8 Dilation Complete Date: 06/23/19 Dilation Complete Time: 1405 Effacement (%): 80 Cervical Position: Middle Station: -1 Presentation: Vertex Exam by:: Dr Janus Molder FHT: baseline rate 125, moderate varibility, 10 x10 acel,  No decel Toco: 1-2 mins  Labs: Lab Results  Component Value Date   WBC 10.9 (H) 06/23/2019   HGB 11.5 (L) 06/23/2019   HCT 34.4 (L) 06/23/2019   MCV 89.1 06/23/2019   PLT 139 (L) 06/23/2019    Patient Active Problem List   Diagnosis Date Noted  . Post-dates pregnancy 06/23/2019  . Unwanted fertility 03/29/2019  . Rubella non-immune status, antepartum 11/10/2018  . Supervision of normal pregnancy 11/09/2018  . History of preterm delivery, currently pregnant 11/09/2018  . Elevated cholesterol 09/22/2017  . Smoker 05/14/2016  . Depression with anxiety 04/05/2013    Assessment / Plan: 33 y.o. G3P1102 at [redacted]w[redacted]d here for IOL for postdates.  Labor: Continue cervical exams for the progression of labor. Pitocin 33mU/min. Fetal Wellbeing:  Cat I Pain Control:  Maternally supported Anticipated MOD:  Vaginal  Rebecca [name], D.O. Family Medicine Resident, PGY-1 06/23/2019, 1:13 PM

## 2019-06-23 NOTE — H&P (Addendum)
LABOR AND DELIVERY ADMISSION HISTORY AND PHYSICAL NOTE  Rebecca [name] is a 33 y.o. female [phone] with IUP at 2770w0d by LMP presenting for IOL due to post dates.  She reports positive fetal movement. She denies leakage of fluid or vaginal bleeding.  Prenatal History/Complications: PNC at [facility] Pregnancy complications:  -h/o preterm labor  Past Medical History: Past Medical History:  Diagnosis Date  . Anxiety   . Back pain   . Boil 05/14/2016  . Breast pain 05/11/2015  . Contraceptive management 08/30/2013  . GERD (gastroesophageal reflux disease)   . LLQ pain 11/04/2014  . OAB (overactive bladder) 04/20/2013  . Reflux   . Smoker 05/14/2016  . Soreness breast 10/25/2013   Left breast sore UOQ, no masses  . Urinary frequency 11/04/2014    Past Surgical History: Past Surgical History:  Procedure Laterality Date  . NO PAST SURGERIES      Obstetrical History: OB History    Gravida  3   Para  2   Term  1   Preterm  1   AB      Living  2     SAB      TAB      Ectopic      Multiple      Live Births  2           Social History: Social History   Socioeconomic History  . Marital status: Single    Spouse name: Not on file  . Number of children: 3  . Years of education: Not on file  . Highest education level: 9th grade  Occupational History  . Not on file  Social Needs  . Financial resource strain: Not hard at all  . Food insecurity    Worry: Not on file    Inability: Never true  . Transportation needs    Medical: No    Non-medical: No  Tobacco Use  . Smoking status: Current Every Day Smoker    Packs/day: 0.50    Years: 15.00    Pack years: 7.50    Types: Cigarettes  . Smokeless tobacco: Never Used  Substance and Sexual Activity  . Alcohol use: No  . Drug use: No  . Sexual activity: Yes    Birth control/protection: None  Lifestyle  . Physical activity    Days per week: 5 days    Minutes per session: 70 min  . Stress: Very much   Relationships  . Social connections    Talks on phone: More than three times a week    Gets together: Never    Attends religious service: 1 to 4 times per year    Active member of club or organization: No    Attends meetings of clubs or organizations: Not on file    Relationship status: Separated  Other Topics Concern  . Not on file  Social History Narrative  . Not on file    Family History: Family History  Problem Relation Age of Onset  . Cancer Maternal Grandmother   . Cancer Maternal Grandfather   . CAD Father   . Stroke Father   . Liver disease Father   . Alcohol abuse Father   . Other Mother        brain tumor  . Asthma Son   . Bronchitis Son   . ADD / ADHD Son     Allergies: Allergies  Allergen Reactions  . Amoxicillin Hives  . Cephalosporins Nausea And Vomiting  . Septra [  Bactrim] Nausea And Vomiting  . Ciprocinonide [Fluocinolone] Nausea And Vomiting  . Penicillins Hives    Facility-Administered Medications Prior to Admission  Medication Dose Route Frequency Provider Last Rate Last Dose  . HYDROXYprogesterone Caproate SOAJ 275 mg  275 mg Subcutaneous Once Florian Buff, MD       Medications Prior to Admission  Medication Sig Dispense Refill Last Dose  . Acetaminophen (TYLENOL PO) Take by mouth as needed.     . doxylamine, Sleep, (UNISOM) 25 MG tablet Take 25 mg by mouth at bedtime as needed.     Marland Kitchen omeprazole (PRILOSEC) 20 MG capsule TAKE 1 CAPSULE BY MOUTH EVERY DAY AS NEEDED 30 capsule 3   . Prenatal Vit-Fe Fumarate-FA (PNV PRENATAL PLUS MULTIVITAMIN) 27-1 MG TABS Take 1 tablet by mouth daily. 30 tablet 11      Review of Systems  All systems reviewed and negative except as stated in HPI  Physical Exam Blood pressure 110/71, pulse 78, resp. rate 18, height 5' (1.524 m), weight 73.2 kg, last menstrual period 09/09/2018. General appearance: alert, oriented Lungs: normal respiratory effort Heart: regular rate Abdomen: soft, non-tender; gravid, FH  appropriate for GA Extremities: No calf swelling or tenderness Presentation: cephalic Fetal monitoring: 140 bpm, moderate variability, 15 x15 acels, no decels Uterine activity: 10 mins Dilation: 3.5 Effacement (%): 60 Station: -2 Exam by:: Epifanio Lesches, RNC  Prenatal labs: ABO, Rh: --/--/O POS, PENDING (07/22 0800) Antibody: NEG (07/22 0800) Rubella: <0.90 (12/09 1100) RPR: Non Reactive (04/27 0914)  HBsAg: Negative (12/09 1100)  HIV: Non Reactive (04/27 0914)  GC/Chlamydia: Negative GBS:  Negative 2-hr GTT: WNL Genetic screening: Negative Anatomy US: Normal  Prenatal Transfer Tool  Maternal Diabetes: No Genetic Screening: Normal Maternal Ultrasounds/Referrals: Normal Fetal Ultrasounds or other Referrals:  None Maternal Substance Abuse:  No Significant Maternal Medications:  None Significant Maternal Lab Results: Group B Strep negative  Results for orders placed or performed during the hospital encounter of 06/23/19 (from the past 24 hour(s))  Type and screen   Collection Time: 06/23/19  8:00 AM  Result Value Ref Range   ABO/RH(D) O POS    Antibody Screen NEG    Sample Expiration      06/26/2019,2359 Performed at [facility], [place] [address], [place], [place] [address]   ABO/Rh   Collection Time: 06/23/19  8:00 AM  Result Value Ref Range   ABO/RH(D) PENDING    No rh immune globuloin      NOT A RH IMMUNE GLOBULIN CANDIDATE, PT RH POSITIVE Performed at [facility] [address], [place], [place] [address]   CBC   Collection Time: 06/23/19  8:02 AM  Result Value Ref Range   WBC 10.9 (H) 4.0 - 10.5 K/uL   RBC 3.86 (L) 3.87 - 5.11 MIL/uL   Hemoglobin 11.5 (L) 12.0 - 15.0 g/dL   HCT 34.4 (L) 36.0 - 46.0 %   MCV 89.1 80.0 - 100.0 fL   MCH 29.8 26.0 - 34.0 pg   MCHC 33.4 30.0 - 36.0 g/dL   RDW 14.3 11.5 - 15.5 %   Platelets 139 (L) 150 - 400 K/uL   nRBC 0.0 0.0 - 0.2 %    Patient Active Problem List   Diagnosis Date Noted  . Post-dates  pregnancy 06/23/2019  . Unwanted fertility 03/29/2019  . Rubella non-immune status, antepartum 11/10/2018  . Supervision of normal pregnancy 11/09/2018  . History of preterm delivery, currently pregnant 11/09/2018  . Elevated cholesterol 09/22/2017  .  Smoker 05/14/2016  . Depression with anxiety 04/05/2013    Assessment: Rebecca [name] is a 33 y.o. G3P1102 at 3441w0d here for IOL due to post dates.  #Labor: Pitocin 752mU/min started 0823. Continue cervical exam for progression of labor.  #Pain: Maternally supported, may have epidural #FWB: Cat I  Cephalic by sutures  EFW 6-7lbs by [place] #ID: GBS negative #MOF: Bottle #MOC: Depo - undecided about BTL, may choose interval   Lavonda JumboSimone Autry-Lott, DO Family Medicine Resident, PGY-1 06/23/2019   Attestation: I have seen this patient and agree with the resident's documentation. I have examined them separately, and we have discussed the plan of care.  Cristal DeerLaurel S. Earlene PlaterWallace, DO OB/GYN Fellow

## 2019-06-24 ENCOUNTER — Other Ambulatory Visit: Payer: Self-pay

## 2019-06-24 MED ORDER — IBUPROFEN 800 MG PO TABS: 800.0000 mg | ORAL_TABLET | Freq: Three times a day (TID) | ORAL | 0 refills | Status: DC

## 2019-06-24 NOTE — Progress Notes (Signed)
CSW received consult for hx of Anxiety and Depression.  CSW met with MOB to offer support and complete assessment.    CSW met with MOB at bedside to discuss consult for history of anxiety and depression, FOB present. CSW introduced self, FOB was leaving the room. CSW explained reason for consult once FOB left the room. MOB was sitting in chair and infant was asleep in basinet. CSW and MOB discussed MOB's mental health history, MOB reported that she was diagnosed with Anxiety and Depression in 2016. MOB reported that the diagnoses run in her family and her mother has it really bad. MOB reported that prior to pregnancy she was taking xanax which was effective in calming her down and helping her sleep. MOB reported that currently she is having issues with sleep and that is her only symptom of anxiety. MOB reported that she is taking Unisom to help her sleep. MOB reported that she plans to speak with her doctor to see if they plan on restarting her medication. MOB denied any depressive symptoms. CSW asked MOB about Lexapro medication listed in chart, MOB reported that she was never on Lexapro and doesn't know where that came from. CSW inquired about how MOB was currently feeling, MOB reported that she was feeling fine and ready to go home. MOB presented calm, pleasant and remained engaged during assessment. MOB did not demonstrate any acute mental health signs/symptoms. CSW assessed for safety, MOB denied SI, HI and domestic violence. CSW inquired about MOB's support system, MOB reported that her mother, FOB Rebecca [name], 34 year old daughter and uncle Rebecca [name] are her supports. MOB reported that she has all items needed to care for infant including a car seat and pack and play with basinet.   CSW provided education regarding the baby blues period vs. perinatal mood disorders, discussed treatment and gave resources for mental health follow up if concerns arise.  CSW recommends self-evaluation during the postpartum time  period using the New Mom Checklist from Postpartum Progress and encouraged MOB to contact a medical professional if symptoms are noted at any time.    CSW provided review of Sudden Infant Death Syndrome (SIDS) precautions. MOB verbalized understanding.   CSW identifies no further need for intervention and no barriers to discharge at this time.  Rebecca [name], [place] Worker [facility] Cell#: [phone]

## 2019-07-12 ENCOUNTER — Encounter: Payer: Self-pay | Admitting: *Deleted

## 2019-07-28 ENCOUNTER — Ambulatory Visit (INDEPENDENT_AMBULATORY_CARE_PROVIDER_SITE_OTHER): Payer: Medicaid Other | Admitting: Advanced Practice Midwife

## 2019-07-28 ENCOUNTER — Encounter: Payer: Self-pay | Admitting: Advanced Practice Midwife

## 2019-07-28 ENCOUNTER — Other Ambulatory Visit: Payer: Self-pay

## 2019-07-28 VITALS — BP 120/80 | HR 74 | Wt 146.0 lb

## 2019-07-28 DIAGNOSIS — E7800 Pure hypercholesterolemia, unspecified: Secondary | ICD-10-CM

## 2019-07-28 DIAGNOSIS — Z1389 Encounter for screening for other disorder: Secondary | ICD-10-CM

## 2019-07-28 MED ORDER — ERYTHROMYCIN 5 MG/GM OP OINT: TOPICAL_OINTMENT | Freq: Four times a day (QID) | OPHTHALMIC | Status: DC

## 2019-07-28 MED ORDER — MEDROXYPROGESTERONE ACETATE 150 MG/ML IM SUSP: 150.0000 mg | INTRAMUSCULAR | 3 refills | Status: DC

## 2019-07-28 NOTE — Progress Notes (Signed)
Rebecca [name] is a 33 y.o. who presents for a postpartum visit. She is 5 weeks postpartum following a spontaneous vaginal delivery. I have fully reviewed the prenatal and intrapartum course. The delivery was at 59 gestational weeks.  Anesthesia: none. Postpartum course has been uneventfual. Baby's course has been uneventful. Baby is feeding by bottle. Bleeding: staining only. Bowel function is normal. Bladder function is normal. Patient is not sexually active. Contraception method is Depo-Provera injections. Postpartum depression screening: negative. She has been off cholesterol meds since prior to pg. Last labs 2016   Current Outpatient Medications:  .  omeprazole (PRILOSEC) 20 MG capsule, TAKE 1 CAPSULE BY MOUTH EVERY DAY AS NEEDED, Disp: 30 capsule, Rfl: 3 .  Prenatal Vit-Fe Fumarate-FA (PNV PRENATAL PLUS MULTIVITAMIN) 27-1 MG TABS, Take 1 tablet by mouth daily., Disp: 30 tablet, Rfl: 11 .  Acetaminophen (TYLENOL PO), Take by mouth as needed., Disp: , Rfl:  .  ibuprofen (ADVIL) 800 MG tablet, Take 1 tablet (800 mg total) by mouth every 8 (eight) hours. (Patient not taking: Reported on 07/28/2019), Disp: 30 tablet, Rfl: 0 .  medroxyPROGESTERone (DEPO-PROVERA) 150 MG/ML injection, Inject 1 mL (150 mg total) into the muscle every 3 (three) months., Disp: 1 mL, Rfl: 3  Current Facility-Administered Medications:  .  erythromycin ophthalmic ointment, , Left Eye, Q6H, Cresenzo-Dishmon, Joaquim Lai, CNM  Review of Systems   Constitutional: Negative for fever and chills Eyes: Negative for visual disturbances Respiratory: Negative for shortness of breath, dyspnea Cardiovascular: Negative for chest pain or palpitations  Gastrointestinal: Negative for vomiting, diarrhea and constipation Genitourinary: Negative for dysuria and urgency Musculoskeletal: Negative for back pain, joint pain, myalgias  Neurological: Negative for dizziness and headaches    Objective:     Vitals:   07/28/19 1346  BP:  120/80  Pulse: 74   General:  alert, cooperative and no distress   Breasts:  negative  Lungs: Normal respiratory effort  Heart:  regular rate and rhythm  Abdomen: Soft, nontender   Vulva:  normal  Vagina: normal vagina  Cervix:  closed  Corpus: Well involuted     Rectal Exam: no hemorrhoids        Assessment:    normal postpartum exam.  Plan:   1. Contraception: depo on 7/23 2. Follow up in:  [place] [address] for depo or as needed.  3. Checkp lipid panel when baby is 22 weeks old.  F/U w/JAG for med recommendations

## 2019-07-29 ENCOUNTER — Encounter: Payer: Self-pay | Admitting: Advanced Practice Midwife

## 2019-07-29 ENCOUNTER — Other Ambulatory Visit: Payer: Self-pay | Admitting: Advanced Practice Midwife

## 2019-07-29 MED ORDER — ERYTHROMYCIN 5 MG/GM OP OINT: 1.0000 "application " | TOPICAL_OINTMENT | Freq: Three times a day (TID) | OPHTHALMIC | 0 refills | Status: DC

## 2019-08-09 ENCOUNTER — Encounter: Payer: Self-pay | Admitting: Advanced Practice Midwife

## 2019-08-10 ENCOUNTER — Encounter: Payer: Self-pay | Admitting: Advanced Practice Midwife

## 2019-08-30 ENCOUNTER — Encounter: Payer: Self-pay | Admitting: Advanced Practice Midwife

## 2019-09-04 DIAGNOSIS — R8290 Unspecified abnormal findings in urine: Secondary | ICD-10-CM | POA: Diagnosis not present

## 2019-09-04 DIAGNOSIS — R350 Frequency of micturition: Secondary | ICD-10-CM | POA: Diagnosis not present

## 2019-09-04 DIAGNOSIS — N3001 Acute cystitis with hematuria: Secondary | ICD-10-CM | POA: Diagnosis not present

## 2019-09-10 DIAGNOSIS — E7800 Pure hypercholesterolemia, unspecified: Secondary | ICD-10-CM | POA: Diagnosis not present

## 2019-09-11 LAB — LIPID PANEL
Chol/HDL Ratio: 8 ratio — ABNORMAL HIGH (ref 0.0–4.4)
Cholesterol, Total: 231 mg/dL — ABNORMAL HIGH (ref 100–199)
HDL: 29 mg/dL — ABNORMAL LOW (ref >39)
LDL Chol Calc (NIH): 150 mg/dL — ABNORMAL HIGH (ref 0–99)
Triglycerides: 283 mg/dL — ABNORMAL HIGH (ref 0–149)
VLDL Cholesterol Cal: 52 mg/dL — ABNORMAL HIGH (ref 5–40)

## 2019-09-15 ENCOUNTER — Other Ambulatory Visit: Payer: Self-pay

## 2019-09-15 ENCOUNTER — Ambulatory Visit (INDEPENDENT_AMBULATORY_CARE_PROVIDER_SITE_OTHER): Payer: Medicaid Other

## 2019-09-15 VITALS — Ht 61.0 in | Wt 150.0 lb

## 2019-09-15 DIAGNOSIS — Z3042 Encounter for surveillance of injectable contraceptive: Secondary | ICD-10-CM | POA: Diagnosis not present

## 2019-09-15 MED ORDER — MEDROXYPROGESTERONE ACETATE 150 MG/ML IM SUSP
150.0000 mg | Freq: Once | INTRAMUSCULAR | Status: AC
  Administered 2019-09-15: 11:00:00 150 mg via INTRAMUSCULAR

## 2019-09-15 NOTE — Progress Notes (Signed)
   NURSE VISIT- INJECTION  SUBJECTIVE:  Rebecca [name] is a 33 y.o. [phone] female here for a Depo Provera for contraception/period management. She is a GYN patient.   OBJECTIVE:  Ht 5\' 1"  (1.549 m)   Wt 150 lb (68 kg)   BMI 28.34 kg/m   Appears well, in no apparent distress  Injection administered in: Left deltoid  Meds ordered this encounter  Medications  . medroxyPROGESTERone (DEPO-PROVERA) injection 150 mg    ASSESSMENT: GYN patient Depo Provera for contraception/period management  PLAN: Follow-up: in 11-13 weeks for next Depo   Ladonna Snide  09/15/2019 11:15 AMt

## 2019-09-16 ENCOUNTER — Encounter: Payer: Self-pay | Admitting: Advanced Practice Midwife

## 2019-09-17 ENCOUNTER — Encounter: Payer: Self-pay | Admitting: Adult Health

## 2019-09-17 ENCOUNTER — Other Ambulatory Visit: Payer: Self-pay

## 2019-09-17 ENCOUNTER — Ambulatory Visit (INDEPENDENT_AMBULATORY_CARE_PROVIDER_SITE_OTHER): Payer: Medicaid Other | Admitting: Adult Health

## 2019-09-17 VITALS — BP 124/87 | HR 82 | Ht 61.0 in | Wt 148.0 lb

## 2019-09-17 DIAGNOSIS — E785 Hyperlipidemia, unspecified: Secondary | ICD-10-CM | POA: Diagnosis not present

## 2019-09-17 MED ORDER — SIMVASTATIN 20 MG PO TABS: 20.0000 mg | ORAL_TABLET | Freq: Every day | ORAL | 6 refills | Status: DC

## 2019-09-17 NOTE — Patient Instructions (Signed)
Dyslipidemia Dyslipidemia is an imbalance of waxy, fat-like substances (lipids) in the blood. The body needs lipids in small amounts. Dyslipidemia often involves a high level of cholesterol or triglycerides, which are types of lipids. Common forms of dyslipidemia include:  High levels of LDL cholesterol. LDL is the type of cholesterol that causes fatty deposits (plaques) to build up in the blood vessels that carry blood away from your heart (arteries).  Low levels of HDL cholesterol. HDL cholesterol is the type of cholesterol that protects against heart disease. High levels of HDL remove the LDL buildup from arteries.  High levels of triglycerides. Triglycerides are a fatty substance in the blood that is linked to a buildup of plaques in the arteries. What are the causes? Primary dyslipidemia is caused by changes (mutations) in genes that are passed down through families (inherited). These mutations cause several types of dyslipidemia. Secondary dyslipidemia is caused by lifestyle choices and diseases that lead to dyslipidemia, such as:  Eating a diet that is high in animal fat.  Not getting enough exercise.  Having diabetes, kidney disease, liver disease, or thyroid disease.  Drinking large amounts of alcohol.  Using certain medicines. What increases the risk? You are more likely to develop this condition if you are an older man or if you are a woman who has gone through menopause. Other risk factors include:  Having a family history of dyslipidemia.  Taking certain medicines, including birth control pills, steroids, some diuretics, and beta-blockers.  Smoking cigarettes.  Eating a high-fat diet.  Having certain medical conditions such as diabetes, polycystic ovary syndrome (PCOS), kidney disease, liver disease, or hypothyroidism.  Not exercising regularly.  Being overweight or obese with too much belly fat. What are the signs or symptoms? In most cases, dyslipidemia does not  usually cause any symptoms. In severe cases, very high lipid levels can cause:  Fatty bumps under the skin (xanthomas).  White or gray ring around the black center (pupil) of the eye. Very high triglyceride levels can cause inflammation of the pancreas (pancreatitis). How is this diagnosed? Your health care provider may diagnose dyslipidemia based on a routine blood test (fasting blood test). Because most people do not have symptoms of the condition, this blood testing (lipid profile) is done on adults age 20 and older and is repeated every 5 years. This test checks:  Total cholesterol. This measures the total amount of cholesterol in your blood, including LDL cholesterol, HDL cholesterol, and triglycerides. A healthy number is below 200.  LDL cholesterol. The target number for LDL cholesterol is different for each person, depending on individual risk factors. Ask your health care provider what your LDL cholesterol should be.  HDL cholesterol. An HDL level of 60 or higher is best because it helps to protect against heart disease. A number below 40 for men or below 50 for women increases the risk for heart disease.  Triglycerides. A healthy triglyceride number is below 150. If your lipid profile is abnormal, your health care provider may do other blood tests. How is this treated? Treatment depends on the type of dyslipidemia that you have and your other risk factors for heart disease and stroke. Your health care provider will have a target range for your lipid levels based on this information. For many people, this condition may be treated by lifestyle changes, such as diet and exercise. Your health care provider may recommend that you:  Get regular exercise.  Make changes to your diet.  Quit smoking if you   smoke. If diet changes and exercise do not help you reach your goals, your health care provider may also prescribe medicine to lower lipids. The most commonly prescribed type of medicine  lowers your LDL cholesterol (statin drug). If you have a high triglyceride level, your provider may prescribe another type of drug (fibrate) or an omega-3 fish oil supplement, or both. Follow these instructions at home:  Eating and drinking  Follow instructions from your health care provider or dietitian about eating or drinking restrictions.  Eat a healthy diet as told by your health care provider. This can help you reach and maintain a healthy weight, lower your LDL cholesterol, and raise your HDL cholesterol. This may include: ? Limiting your calories, if you are overweight. ? Eating more fruits, vegetables, whole grains, fish, and lean meats. ? Limiting saturated fat, trans fat, and cholesterol.  If you drink alcohol: ? Limit how much you use. ? Be aware of how much alcohol is in your drink. In the [place] one drink equals one 12 oz bottle of beer (355 mL), one 5 oz glass of wine (148 mL), or one 1 oz glass of hard liquor (44 mL).  Do not drink alcohol if: ? Your health care provider tells you not to drink. ? You are pregnant, may be pregnant, or are planning to become pregnant. Activity  Get regular exercise. Start an exercise and strength training program as told by your health care provider. Ask your health care provider what activities are safe for you. Your health care provider may recommend: ? 30 minutes of aerobic activity 4-6 days a week. Brisk walking is an example of aerobic activity. ? Strength training 2 days a week. General instructions  Do not use any products that contain nicotine or tobacco, such as cigarettes, e-cigarettes, and chewing tobacco. If you need help quitting, ask your health care provider.  Take over-the-counter and prescription medicines only as told by your health care provider. This includes supplements.  Keep all follow-up visits as told by your health care provider. Contact a health care provider if:  You are: ? Having trouble sticking to your  exercise or diet plan. ? Struggling to quit smoking or control your use of alcohol. Summary  Dyslipidemia often involves a high level of cholesterol or triglycerides, which are types of lipids.  Treatment depends on the type of dyslipidemia that you have and your other risk factors for heart disease and stroke.  For many people, treatment starts with lifestyle changes, such as diet and exercise.  Your health care provider may prescribe medicine to lower lipids. This information is not intended to replace advice given to you by your health care provider. Make sure you discuss any questions you have with your health care provider. Document Released: 11/23/2013 Document Revised: 07/13/2018 Document Reviewed: 06/19/2018 Elsevier Patient Education  2020 Elsevier Inc.  Preventing High Cholesterol Cholesterol is a white, waxy substance similar to fat that the human body needs to help build cells. The liver makes all the cholesterol that a person's body needs. Having high cholesterol (hypercholesterolemia) increases a person's risk for heart disease and stroke. Extra (excess) cholesterol comes from the food the person eats. High cholesterol can often be prevented with diet and lifestyle changes. If you already have high cholesterol, you can control it with diet and lifestyle changes and with medicine. How can high cholesterol affect me? If you have high cholesterol, deposits (plaques) may build up on the walls of your arteries. The arteries are the   blood vessels that carry blood away from your heart. Plaques make the arteries narrower and stiffer. This can limit or block blood flow and cause blood clots to form. Blood clots:  Are tiny balls of cells that form in your blood.  Can move to the heart or brain, causing a heart attack or stroke. Plaques in arteries greatly increase your risk for heart attack and stroke.Making diet and lifestyle changes can reduce your risk for these conditions that may  threaten your life. What can increase my risk? This condition is more likely to develop in people who:  Eat foods that are high in saturated fat or cholesterol. Saturated fat is mostly found in: ? Foods that contain animal fat, such as red meat and some dairy products. ? Certain fatty foods made from plants, such as tropical oils.  Are overweight.  Are not getting enough exercise.  Have a family history of high cholesterol. What actions can I take to prevent this? Nutrition   Eat less saturated fat.  Avoid trans fats (partially hydrogenated oils). These are often found in margarine and in some baked goods, fried foods, and snacks bought in packages.  Avoid precooked or cured meat, such as sausages or meat loaves.  Avoid foods and drinks that have added sugars.  Eat more fruits, vegetables, and whole grains.  Choose healthy sources of protein, such as fish, poultry, lean cuts of red meat, beans, peas, lentils, and nuts.  Choose healthy sources of fat, such as: ? Nuts. ? Vegetable oils, especially olive oil. ? Fish that have healthy fats (omega-3 fatty acids), such as mackerel or salmon. The items listed above may not be a complete list of recommended foods and beverages. Contact a dietitian for more information. Lifestyle  Lose weight if you are overweight. Losing 5-10 lb (2.3-4.5 kg) can help prevent or control high cholesterol. It can also lower your risk for diabetes and high blood pressure. Ask your health care provider to help you with a diet and exercise plan to lose weight safely.  Do not use any products that contain nicotine or tobacco, such as cigarettes, e-cigarettes, and chewing tobacco. If you need help quitting, ask your health care provider.  Limit your alcohol intake. ? Do not drink alcohol if:  Your health care provider tells you not to drink.  You are pregnant, may be pregnant, or are planning to become pregnant. ? If you drink alcohol:  Limit how much  you use to:  0-1 drink a day for women.  0-2 drinks a day for men.  Be aware of how much alcohol is in your drink. In the [place] one drink equals one 12 oz bottle of beer (355 mL), one 5 oz glass of wine (148 mL), or one 1 oz glass of hard liquor (44 mL). Activity   Get enough exercise. Each week, do at least 150 minutes of exercise that takes a medium level of effort (moderate-intensity exercise). ? This is exercise that:  Makes your heart beat faster and makes you breathe harder than usual.  Allows you to still be able to talk. ? You could exercise in short sessions several times a day or longer sessions a few times a week. For example, on 5 days each week, you could walk fast or ride your bike 3 times a day for 10 minutes each time.  Do exercises as told by your health care provider. Medicines  In addition to diet and lifestyle changes, your health care provider may recommend   medicines to help lower cholesterol. This may be a medicine to lower the amount of cholesterol your liver makes. You may need medicine if: ? Diet and lifestyle changes do not lower your cholesterol enough. ? You have high cholesterol and other risk factors for heart disease or stroke.  Take over-the-counter and prescription medicines only as told by your health care provider. General information  Manage your risk factors for high cholesterol. Talk with your health care provider about all your risk factors and how to lower your risk.  Manage other conditions that you have, such as diabetes or high blood pressure (hypertension).  Have blood tests to check your cholesterol levels at regular points in time as told by your health care provider.  Keep all follow-up visits as told by your health care provider. This is important. Where to find more information  American Heart Association: www.heart.org  National Heart, Lung, and Blood Institute: www.nhlbi.nih.gov Summary  High cholesterol increases your risk  for heart disease and stroke. By keeping your cholesterol level low, you can reduce your risk for these conditions.  High cholesterol can often be prevented with diet and lifestyle changes.  Work with your health care provider to manage your risk factors, and have your blood tested regularly. This information is not intended to replace advice given to you by your health care provider. Make sure you discuss any questions you have with your health care provider. Document Released: 12/03/2015 Document Revised: 03/12/2019 Document Reviewed: 07/27/2016 Elsevier Patient Education  2020 Elsevier Inc.  

## 2019-09-17 NOTE — Progress Notes (Signed)
Subjective:     Patient ID: Rebecca [name], female   DOB: [birth year], 33 y.o.   MRN: [id number]  HPI Rebecca [name] is a 33 year old white female, single, [id number] in to discuss lipid profile. She had delivery in July and has been off zocor for over a year now.She says she needs to see pain management clinic, she was seeing Dr Gerarda Fraction and he dismissed her after ex-friend called and told him she was selling her hydrocodone, which she says she is not. No PCP.  Review of Systems Chronic back pain Reviewed past medical,surgical, social and family history. Reviewed medications and allergies.     Objective:   Physical Exam BP 124/87 (BP Location: Left Arm, Patient Position: Sitting, Cuff Size: Normal)   Pulse 82   Ht 5\' 1"  (1.549 m)   Wt 148 lb (67.1 kg)   Breastfeeding No   BMI 27.96 kg/m   Skin warm and dry. Neck: mid line trachea, normal thyroid, good ROM, no lymphadenopathy noted. Lungs: clear to ausculation bilaterally. Cardiovascular: regular rate and rhythm. Face time 15 minutes with 50% coordinating care.    Lipid panel (  Ref Range & Units 7d ago  Cholesterol, Total 100 - 199 mg/dL 231High    Triglycerides 0 - 149 mg/dL 283High    HDL >39 mg/dL 29Low    VLDL Cholesterol Cal 5 - 40 mg/dL 52High    LDL Chol Calc (NIH) 0 - 99 mg/dL 150High    Chol/HDL Ratio 0.0 - 4.4 ratio 8.0High    Comment:                 T. Chol/HDL Ratio                        Men Women                 1/2 Avg.Risk 3.4  3.3                   Avg.Risk 5.0  4.4                 2X Avg.Risk 9.6  7.1                 3X Avg.Risk 23.4  11.0   Resulting Agency  [facility]    Narrative Performed by: [facility] Performed at: [facility]  [address], [place], [place] [id number]  Lab Director: Rush Farmer MD, Phone: [phone]    Specimen Collected: 09/10/19 15:11 Last Resulted:  09/11/19 05:36                                     Provider Status: Ordered                    Assessment:       1. Dyslipidemia (high LDL; low HDL)    Plan:     Will resume zocor, watch diet and try to exercise  Meds ordered this encounter  Medications  . simvastatin (ZOCOR) 20 MG tablet    Sig: Take 1 tablet (20 mg total) by mouth daily.    Dispense:  30 tablet    Refill:  6    Order Specific Question:   Supervising Provider    Answer:   Tania Ade H [[id number]]     Return in 3 months for ROS and fasting labs,  when gets depo Called Dr Gerilyn Pilgrim for referral for pain management, they need referral from PCP, pt aware, to call Dr Sherwood Gambler to see if he will refer

## 2019-11-11 ENCOUNTER — Other Ambulatory Visit: Payer: Self-pay | Admitting: Adult Health

## 2019-11-11 MED ORDER — OMEPRAZOLE 20 MG PO CPDR: DELAYED_RELEASE_CAPSULE | ORAL | 3 refills | Status: DC

## 2019-11-11 NOTE — Progress Notes (Signed)
Refilled priolosec

## 2019-11-24 ENCOUNTER — Other Ambulatory Visit (INDEPENDENT_AMBULATORY_CARE_PROVIDER_SITE_OTHER): Payer: Medicaid Other | Admitting: *Deleted

## 2019-11-24 ENCOUNTER — Other Ambulatory Visit: Payer: Self-pay

## 2019-11-24 ENCOUNTER — Encounter: Payer: Self-pay | Admitting: *Deleted

## 2019-11-24 ENCOUNTER — Encounter: Payer: Self-pay | Admitting: Advanced Practice Midwife

## 2019-11-24 VITALS — Ht 61.0 in | Wt 149.0 lb

## 2019-11-24 DIAGNOSIS — R350 Frequency of micturition: Secondary | ICD-10-CM

## 2019-11-24 DIAGNOSIS — R109 Unspecified abdominal pain: Secondary | ICD-10-CM | POA: Diagnosis not present

## 2019-11-24 NOTE — Progress Notes (Addendum)
   NURSE VISIT- UTI SYMPTOMS   SUBJECTIVE:  Rebecca [name] is a 33 y.o. [phone] female here for UTI symptoms. She is a GYN patient. She reports flank pain on the left and urinary frequency and lower abdominal pressure.  OBJECTIVE:  There were no vitals taken for this visit.  Appears well, in no apparent distress  No results found for this or any previous visit (from the past 24 hour(s)).  ASSESSMENT: GYN patient with UTI symptoms and negative nitrites  PLAN: Discussed with Knute Neu, CNM, [facility]   Rx sent by provider today: No Urine culture sent Call or return to clinic prn if these symptoms worsen or fail to improve as anticipated. Encouraged patient to push fluids as she could have a kidney stone based on her symptoms.  Follow-up: as scheduled  Alice Rieger  11/24/2019 2:04 PM

## 2019-11-26 LAB — URINE CULTURE

## 2019-12-06 NOTE — Progress Notes (Signed)
Chart reviewed for nurse visit. Agree with plan of care.  Adline Potter, NP 12/06/2019 11:19 AM

## 2019-12-07 ENCOUNTER — Other Ambulatory Visit: Payer: Self-pay | Admitting: Adult Health

## 2019-12-08 ENCOUNTER — Other Ambulatory Visit: Payer: Self-pay

## 2019-12-08 ENCOUNTER — Ambulatory Visit: Payer: Medicaid Other | Admitting: Adult Health

## 2019-12-08 ENCOUNTER — Ambulatory Visit: Payer: Medicaid Other

## 2019-12-08 ENCOUNTER — Ambulatory Visit (INDEPENDENT_AMBULATORY_CARE_PROVIDER_SITE_OTHER): Payer: Medicaid Other | Admitting: Adult Health

## 2019-12-08 ENCOUNTER — Encounter: Payer: Self-pay | Admitting: Adult Health

## 2019-12-08 VITALS — BP 109/77 | HR 91 | Ht 61.0 in | Wt 151.4 lb

## 2019-12-08 DIAGNOSIS — Z3042 Encounter for surveillance of injectable contraceptive: Secondary | ICD-10-CM | POA: Insufficient documentation

## 2019-12-08 DIAGNOSIS — E785 Hyperlipidemia, unspecified: Secondary | ICD-10-CM

## 2019-12-08 MED ORDER — MEDROXYPROGESTERONE ACETATE 150 MG/ML IM SUSP: 150.0000 mg | INTRAMUSCULAR | 3 refills | Status: DC

## 2019-12-08 MED ORDER — MEDROXYPROGESTERONE ACETATE 150 MG/ML IM SUSP
150.0000 mg | Freq: Once | INTRAMUSCULAR | Status: AC
  Administered 2019-12-08: 150 mg via INTRAMUSCULAR

## 2019-12-08 NOTE — Progress Notes (Signed)
Patient was given Depo Provera 150 mg right deltoid. Next dose due March 24-April 7.

## 2019-12-08 NOTE — Progress Notes (Signed)
  Subjective:     Patient ID: Rebecca [name], female   DOB: 04-22-1986, 34 y.o.   MRN: [id number]  HPI Rebecca [name] is a 34 year old white female, single, [id number], in for to get a depo and needs fasting labs to check lipids. PCP is Gilford Silvius NP.  Review of Systems  Happy with depo, no periods   Reviewed past medical,surgical, social and family history. Reviewed medications and allergies.     Objective:   Physical Exam BP 109/77 (BP Location: Right Arm, Patient Position: Sitting, Cuff Size: Normal)   Pulse 91   Ht 5\' 1"  (1.549 m)   Wt 151 lb 6.4 oz (68.7 kg)   BMI 28.61 kg/m   Skin warm and dry.. Lungs: clear to ausculation bilaterally. Cardiovascular: regular rate and rhythm. Pt received dep[facility] from [facility].    Assessment:     1. Dyslipidemia (high LDL; low HDL) Check CMP and lipids Continue zocor, has refills  2. Encounter for surveillance of injectable contraceptive Refilled depo Meds ordered this encounter  Medications  . medroxyPROGESTERone (DEPO-PROVERA) injection 150 mg  . medroxyPROGESTERone (DEPO-PROVERA) 150 MG/ML injection    Sig: Inject 1 mL (150 mg total) into the muscle every 3 (three) months.    Dispense:  1 mL    Refill:  3    Order Specific Question:   Supervising Provider    Answer:   [facility] [[id number]]  Get next depo in 12 weeks     Plan:     Pap and physical in August    Pt has appt with new PCP in about 2 weeks

## 2019-12-09 LAB — COMPREHENSIVE METABOLIC PANEL
ALT: 65 IU/L — ABNORMAL HIGH (ref 0–32)
AST: 31 IU/L (ref 0–40)
Albumin/Globulin Ratio: 1.6 (ref 1.2–2.2)
Albumin: 4.1 g/dL (ref 3.8–4.8)
Alkaline Phosphatase: 122 IU/L — ABNORMAL HIGH (ref 39–117)
BUN/Creatinine Ratio: 8 — ABNORMAL LOW (ref 9–23)
BUN: 5 mg/dL — ABNORMAL LOW (ref 6–20)
Bilirubin Total: 0.3 mg/dL (ref 0.0–1.2)
CO2: 21 mmol/L (ref 20–29)
Calcium: 9.5 mg/dL (ref 8.7–10.2)
Chloride: 107 mmol/L — ABNORMAL HIGH (ref 96–106)
Creatinine, Ser: 0.65 mg/dL (ref 0.57–1.00)
GFR calc Af Amer: 134 mL/min/{1.73_m2} (ref >59)
GFR calc non Af Amer: 116 mL/min/{1.73_m2} (ref >59)
Globulin, Total: 2.5 g/dL (ref 1.5–4.5)
Glucose: 86 mg/dL (ref 65–99)
Potassium: 4 mmol/L (ref 3.5–5.2)
Sodium: 142 mmol/L (ref 134–144)
Total Protein: 6.6 g/dL (ref 6.0–8.5)

## 2019-12-09 LAB — LIPID PANEL
Chol/HDL Ratio: 5.2 ratio — ABNORMAL HIGH (ref 0.0–4.4)
Cholesterol, Total: 161 mg/dL (ref 100–199)
HDL: 31 mg/dL — ABNORMAL LOW (ref >39)
LDL Chol Calc (NIH): 104 mg/dL — ABNORMAL HIGH (ref 0–99)
Triglycerides: 148 mg/dL (ref 0–149)
VLDL Cholesterol Cal: 26 mg/dL (ref 5–40)

## 2019-12-17 ENCOUNTER — Other Ambulatory Visit: Payer: Self-pay

## 2019-12-20 ENCOUNTER — Other Ambulatory Visit: Payer: Self-pay

## 2019-12-20 ENCOUNTER — Ambulatory Visit: Payer: Medicaid Other | Admitting: Family Medicine

## 2019-12-20 ENCOUNTER — Encounter: Payer: Self-pay | Admitting: Family Medicine

## 2019-12-20 VITALS — BP 115/74 | HR 95 | Temp 99.0°F | Resp 20 | Ht 61.0 in | Wt 150.0 lb

## 2019-12-20 DIAGNOSIS — M549 Dorsalgia, unspecified: Secondary | ICD-10-CM

## 2019-12-20 DIAGNOSIS — E785 Hyperlipidemia, unspecified: Secondary | ICD-10-CM | POA: Diagnosis not present

## 2019-12-20 DIAGNOSIS — G8929 Other chronic pain: Secondary | ICD-10-CM | POA: Diagnosis not present

## 2019-12-20 NOTE — Progress Notes (Signed)
Subjective:  Patient ID: Rebecca [name], female    DOB: 1986/01/02, 34 y.o.   MRN: [id number]  Patient Care Team: Baruch Gouty, FNP as PCP - General (Family Medicine)   Chief Complaint:  Establish Care (New = Dr Legrand Rams )   HPI: Rebecca [name] is a 34 y.o. female presenting on 12/20/2019 for Establish Care (New = Dr Legrand Rams )  Pt presents today to establish care. Pt has not had a PCP in several years. States she does see her GYN on a regular basis and she has been prescribing her cholesterol, GERD, and contraceptives. States she has chronic upper and mid back pain. States she was being seen by pain management for this and did not feel the provider-patient relationship was good and stopped going. Pt states she needs a referral to a new pain management clinic today. States her GYN tried to place one and was unable. Pt states she has been taking hydrocodone to manage the pain. States the pain starts in her neck and runs to her mid back. No new injuries or symptoms. No loss of function, radiculopathy, or weakness. Reports prior imaging several years ago. States the pain is exacerbated by activity and lifting. States she can not bend over for long periods of time due to discomfort. States pain can be 8/10 at times.     Relevant past medical, surgical, family, and social history reviewed and updated as indicated.  Allergies and medications reviewed and updated. Date reviewed: Chart in Epic.   Past Medical History:  Diagnosis Date  . Anxiety   . Back pain   . Boil 05/14/2016  . Breast pain 05/11/2015  . Contraceptive management 08/30/2013  . GERD (gastroesophageal reflux disease)   . Hyperlipidemia   . LLQ pain 11/04/2014  . OAB (overactive bladder) 04/20/2013  . Reflux   . Smoker 05/14/2016  . Soreness breast 10/25/2013   Left breast sore UOQ, no masses  . Urinary frequency 11/04/2014    Past Surgical History:  Procedure Laterality Date  . NO PAST SURGERIES      Social History    Socioeconomic History  . Marital status: Single    Spouse name: Not on file  . Number of children: 3  . Years of education: Not on file  . Highest education level: 9th grade  Occupational History  . Not on file  Tobacco Use  . Smoking status: Current Every Day Smoker    Packs/day: 0.50    Years: 15.00    Pack years: 7.50    Types: Cigarettes  . Smokeless tobacco: Never Used  Substance and Sexual Activity  . Alcohol use: No  . Drug use: Yes    Types: Hydrocodone  . Sexual activity: Yes    Birth control/protection: Injection  Other Topics Concern  . Not on file  Social History Narrative  . Not on file   Social Determinants of Health   Financial Resource Strain:   . Difficulty of Paying Living Expenses: Not on file  Food Insecurity:   . Worried About Charity fundraiser in the Last Year: Not on file  . Ran Out of Food in the Last Year: Not on file  Transportation Needs:   . Lack of Transportation (Medical): Not on file  . Lack of Transportation (Non-Medical): Not on file  Physical Activity:   . Days of Exercise per Week: Not on file  . Minutes of Exercise per Session: Not on file  Stress:   .  Feeling of Stress : Not on file  Social Connections:   . Frequency of Communication with Friends and Family: Not on file  . Frequency of Social Gatherings with Friends and Family: Not on file  . Attends Religious Services: Not on file  . Active Member of Clubs or Organizations: Not on file  . Attends Banker Meetings: Not on file  . Marital Status: Not on file  Intimate Partner Violence:   . Fear of Current or Ex-Partner: Not on file  . Emotionally Abused: Not on file  . Physically Abused: Not on file  . Sexually Abused: Not on file    Outpatient Encounter Medications as of 12/20/2019  Medication Sig  . Acetaminophen (TYLENOL PO) Take by mouth as needed.  . medroxyPROGESTERone (DEPO-PROVERA) 150 MG/ML injection Inject 1 mL (150 mg total) into the muscle  every 3 (three) months.  Marland Kitchen omeprazole (PRILOSEC) 20 MG capsule TAKE 1 CAPSULE BY MOUTH EVERY DAY AS NEEDED  . simvastatin (ZOCOR) 20 MG tablet Take 1 tablet (20 mg total) by mouth daily.  . [DISCONTINUED] erythromycin ophthalmic ointment    No facility-administered encounter medications on file as of 12/20/2019.    Allergies  Allergen Reactions  . Amoxicillin Hives  . Cephalosporins Nausea And Vomiting  . Septra [Bactrim] Nausea And Vomiting  . Ciprocinonide [Fluocinolone] Nausea And Vomiting  . Penicillins Hives    Review of Systems  Constitutional: Negative for activity change, appetite change, chills, diaphoresis, fatigue, fever and unexpected weight change.  HENT: Negative.   Eyes: Negative.   Respiratory: Negative for cough, chest tightness and shortness of breath.   Cardiovascular: Negative for chest pain, palpitations and leg swelling.  Gastrointestinal: Negative for abdominal pain, blood in stool, constipation, diarrhea, nausea and vomiting.  Endocrine: Negative.   Genitourinary: Negative for decreased urine volume, difficulty urinating, dysuria, frequency and urgency.  Musculoskeletal: Positive for arthralgias, back pain and myalgias. Negative for gait problem, joint swelling, neck pain and neck stiffness.  Skin: Negative.   Allergic/Immunologic: Negative.   Neurological: Negative for dizziness, tremors, seizures, syncope, facial asymmetry, speech difficulty, weakness, light-headedness, numbness and headaches.  Hematological: Negative.   Psychiatric/Behavioral: Negative for confusion, hallucinations, sleep disturbance and suicidal ideas.  All other systems reviewed and are negative.       Objective:  BP 115/74   Pulse 95   Temp 99 F (37.2 C)   Resp 20   Ht 5\' 1"  (1.549 m)   Wt 150 lb (68 kg)   SpO2 100%   BMI 28.34 kg/m    Wt Readings from Last 3 Encounters:  12/20/19 150 lb (68 kg)  12/08/19 151 lb 6.4 oz (68.7 kg)  11/24/19 149 lb (67.6 kg)     Physical Exam Vitals and nursing note reviewed.  Constitutional:      General: She is not in acute distress.    Appearance: Normal appearance. She is well-developed and well-groomed. She is not ill-appearing, toxic-appearing or diaphoretic.  HENT:     Head: Normocephalic and atraumatic.     Jaw: There is normal jaw occlusion.     Right Ear: Hearing normal.     Left Ear: Hearing normal.     Nose: Nose normal.     Mouth/Throat:     Lips: Pink.     Mouth: Mucous membranes are moist.     Pharynx: Oropharynx is clear. Uvula midline.  Eyes:     General: Lids are normal.     Extraocular Movements: Extraocular movements intact.  Conjunctiva/sclera: Conjunctivae normal.     Pupils: Pupils are equal, round, and reactive to light.  Neck:     Thyroid: No thyroid mass, thyromegaly or thyroid tenderness.     Vascular: No carotid bruit or JVD.     Trachea: Trachea and phonation normal.  Cardiovascular:     Rate and Rhythm: Normal rate and regular rhythm.     Chest Wall: PMI is not displaced.     Pulses: Normal pulses.     Heart sounds: Normal heart sounds. No murmur. No friction rub. No gallop.   Pulmonary:     Effort: Pulmonary effort is normal. No respiratory distress.     Breath sounds: Normal breath sounds. No wheezing.  Abdominal:     General: Bowel sounds are normal. There is no distension or abdominal bruit.     Palpations: Abdomen is soft. There is no hepatomegaly or splenomegaly.     Tenderness: There is no abdominal tenderness. There is no right CVA tenderness or left CVA tenderness.     Hernia: No hernia is present.  Musculoskeletal:     Right shoulder: Normal.     Left shoulder: Normal.     Cervical back: Neck supple. Tenderness present. Decreased range of motion.     Thoracic back: Tenderness present. Decreased range of motion.     Lumbar back: Normal.     Right lower leg: No edema.     Left lower leg: No edema.  Lymphadenopathy:     Cervical: No cervical  adenopathy.  Skin:    General: Skin is warm and dry.     Capillary Refill: Capillary refill takes less than 2 seconds.     Coloration: Skin is not cyanotic, jaundiced or pale.     Findings: No rash.  Neurological:     General: No focal deficit present.     Mental Status: She is alert and oriented to person, place, and time.     Cranial Nerves: Cranial nerves are intact. No cranial nerve deficit.     Sensory: Sensation is intact. No sensory deficit.     Motor: Motor function is intact. No weakness.     Coordination: Coordination is intact. Coordination normal.     Gait: Gait is intact. Gait normal.     Deep Tendon Reflexes: Reflexes are normal and symmetric. Reflexes normal.  Psychiatric:        Attention and Perception: Attention and perception normal.        Mood and Affect: Mood and affect normal.        Speech: Speech normal.        Behavior: Behavior normal. Behavior is cooperative.        Thought Content: Thought content normal.        Cognition and Memory: Cognition and memory normal.        Judgment: Judgment normal.     Results for orders placed or performed in visit on 12/08/19  Lipid Profile  Result Value Ref Range   Cholesterol, Total 161 100 - 199 mg/dL   Triglycerides 109 0 - 149 mg/dL   HDL 31 (L) >32 mg/dL   VLDL Cholesterol Cal 26 5 - 40 mg/dL   LDL Chol Calc (NIH) 355 (H) 0 - 99 mg/dL   Chol/HDL Ratio 5.2 (H) 0.0 - 4.4 ratio  Comprehensive metabolic panel  Result Value Ref Range   Glucose 86 65 - 99 mg/dL   BUN 5 (L) 6 - 20 mg/dL   Creatinine, Ser 7.32 0.57 -  1.00 mg/dL   GFR calc non Af Amer 116 >59 mL/min/1.73   GFR calc Af Amer 134 >59 mL/min/1.73   BUN/Creatinine Ratio 8 (L) 9 - 23   Sodium 142 134 - 144 mmol/L   Potassium 4.0 3.5 - 5.2 mmol/L   Chloride 107 (H) 96 - 106 mmol/L   CO2 21 20 - 29 mmol/L   Calcium 9.5 8.7 - 10.2 mg/dL   Total Protein 6.6 6.0 - 8.5 g/dL   Albumin 4.1 3.8 - 4.8 g/dL   Globulin, Total 2.5 1.5 - 4.5 g/dL    Albumin/Globulin Ratio 1.6 1.2 - 2.2   Bilirubin Total 0.3 0.0 - 1.2 mg/dL   Alkaline Phosphatase 122 (H) 39 - 117 IU/L   AST 31 0 - 40 IU/L   ALT 65 (H) 0 - 32 IU/L       Pertinent labs & imaging results that were available during my care of the patient were reviewed by me and considered in my medical decision making.  Assessment & Plan:  Airiel was seen today for establish care.  Diagnoses and all orders for this visit:  Dyslipidemia (high LDL; low HDL) Currently on statin therapy and tolerating well. Recent labs from GYN office reviewed. Pt has refills. Diet and exercise encouraged.   Chronic high back pain Chronic mid back pain Pt was previously seen by Dr. Felecia Shelling and did not feel this was a good relationship and stopped going. States she needs a referral to pain management for her chronic back pain. Will place referral today.  -     Ambulatory referral to Pain Clinic     Continue all other maintenance medications.  Follow up plan: Return in about 6 months (around 06/18/2020), or if symptoms worsen or fail to improve.  Continue healthy lifestyle choices, including diet (rich in fruits, vegetables, and lean proteins, and low in salt and simple carbohydrates) and exercise (at least 30 minutes of moderate physical activity daily).  Educational handout given for healthy living  The above assessment and management plan was discussed with the patient. The patient verbalized understanding of and has agreed to the management plan. Patient is aware to call the clinic if they develop any new symptoms or if symptoms persist or worsen. Patient is aware when to return to the clinic for a follow-up visit. Patient educated on when it is appropriate to go to the emergency department.   Kari Baars, FNP-C [facility] [phone]

## 2019-12-20 NOTE — Patient Instructions (Signed)
The 4 Principles of Healthy Living  1. Do Not Smoke.  2. Maintain a BMI<30.  3. Exercise 150 minutes/week. (30 minutes 5 days a week of some form of aerobic exercise)  4. Eat 5 servings of fruits or vegetables daily.   Several studies have conclusively shown that individuals who do these things have a dramatic reduction in overall mortality, heart disease, diabetes, hypertension, stroke, congestive heart failure, and cancer. This means that without taking any pills, vitamins, tonics, etc - without spending a single penny - you can live a longer, healthier, more productive life. Let's look at these 4 principles separately.   1. Do Not Smoke - Make up your mind to quit, talk with your doctor to formulate a plan, and set a date.  2. Get to and maintain a BMI<30. This gets you out of the obese category. No longer being obese will dramatically reduce you and your family's risk of a multitude of health problems. It is not easy, but, it is possible. If you are extremely obese it may take years, but, each step you take will lead to dramatic rewards. With just 20 pounds of weight loss most people feel better, have less fatigue and joint pain, and feel more energetic. If you have health problems like diabetes, hypertension, or high cholesterol you might do away with your need for some medications. And most of all, while you change you lifestyle to attain this goal you will set a good example for all those around you, especially your children. Here are two proven steps to help you start losing weight. ? Portion Control - Eating your meals on a smaller plate and limiting the amount of calories you eat at each meal has been shown to lead to weight loss. The average plate is 10 inches in diameter. A 10 inch plate piled high with food can add up to 1500 calories (even more if you go back for seconds). The typical man needs 2000 calories per day total. Try using a smaller plate (8 inch paper plate or 7 inch saucer)  at each meal and NEVER go back for second helpings. ? Pedometer - individuals who wear a pedometer and try to walk 10,000 steps each day increase their physical activity, lose weight, and decrease their blood pressures.  3. Exercise 150 minutes/week. The overall health benefits of regular aerobic exercise are overwhelming:  Reduces the risk of dying prematurely. Reduces the risk of dying from heart disease.  Reduces the risk of stroke.  Reduces the risk of developing diabetes.  Reduces the risk of developing high blood pressure.  Helps reduce blood pressure in people who already have high blood pressure.  Reduces the risk of developing colon cancer.  Reduces feelings of depression and anxiety.  Helps control weight.  Helps build and maintain healthy bones, muscles and joints.  Helps older adults become stronger and better able to move about without falling.  Promotes psychological well-being.  If you have health problems or are over age 60 we recommend consulting your doctor before beginning. We recommend starting slow and working up. Start by reading the Healthnote on Starting an Exercise Program then get going. Do not over think the process. Pick something simple at home like walking with friends, using a treadmill, or riding an exercise bike. Experiment with different types of exercise until you find something you can tolerate (it does not have to be fun). Pick a 30 minute disc of inspirational music and listen to it while you exercise.   The more you do it the easier it will become and the better you will feel.  4. Eat 5 servings of fruits or vegetables daily.  A serving size is: One medium-size fruit  1/2 cup raw, cooked, frozen or canned fruits (in 100% juice) or vegetables  3/4 cup (6 oz.) 100% fruit or vegetable juice  1 cup raw, leafy vegetables  1/4 cup dried fruit   While this sounds easy enough actually getting this much fruits and vegetables takes some work and planning. You  will need to experiment with different types of fruits and vegetables to find ones you and your family can eat every day. Some simple tips include:  - Add fruit to your cereal each morning. - Eat a salad each day for lunch. The typical bowl of salad counts for 2 servings of vegetables.  - Have some fruits and vegetables at every meal. Use canned or frozen products if needed. - Eat fruits and vegetables for snacks - especially for the kids. - Replace the side of fries or chips with a cup of fruit, an apple, or a bowl of celery.  What are the benefits? Reduces heart disease and stroke. Possible reduction in cancer risk. Protects against the development of diabetes. Filling up on fat free fruits and vegetable decreases the amount of high fat foods you will eat, aiding in weight loss.  

## 2019-12-23 DIAGNOSIS — M545 Low back pain: Secondary | ICD-10-CM | POA: Diagnosis not present

## 2019-12-23 DIAGNOSIS — Z79899 Other long term (current) drug therapy: Secondary | ICD-10-CM | POA: Diagnosis not present

## 2020-02-10 ENCOUNTER — Encounter: Payer: Self-pay | Admitting: Family Medicine

## 2020-02-10 ENCOUNTER — Other Ambulatory Visit: Payer: Self-pay | Admitting: Family Medicine

## 2020-02-10 DIAGNOSIS — G8929 Other chronic pain: Secondary | ICD-10-CM

## 2020-02-17 ENCOUNTER — Other Ambulatory Visit: Payer: Self-pay

## 2020-02-17 ENCOUNTER — Encounter: Payer: Self-pay | Admitting: Adult Health

## 2020-02-17 ENCOUNTER — Ambulatory Visit (INDEPENDENT_AMBULATORY_CARE_PROVIDER_SITE_OTHER): Payer: Medicaid Other | Admitting: Adult Health

## 2020-02-17 VITALS — BP 118/83 | HR 84 | Ht 61.0 in | Wt 151.0 lb

## 2020-02-17 DIAGNOSIS — R140 Abdominal distension (gaseous): Secondary | ICD-10-CM | POA: Diagnosis not present

## 2020-02-17 DIAGNOSIS — Z30017 Encounter for initial prescription of implantable subdermal contraceptive: Secondary | ICD-10-CM | POA: Diagnosis not present

## 2020-02-17 NOTE — Progress Notes (Signed)
  Subjective:     Patient ID: Rebecca [name], female   DOB: June 15, 1986, 34 y.o.   MRN: [id number]  HPI Ciarrah is a 34 year old white female,single, G3P2103, on depo, in to discuss changing birth control to nexplanon, is having bloating and spotted this month on depo. PCP is linda Rakes NP.  Review of Systems +bloating Spotted this month brown to pink with dep0 Patient denies any headaches, hearing loss, fatigue, blurred vision, shortness of breath, chest pain, abdominal pain, problems with bowel movements, urination, or intercourse. No joint pain or mood swings.  Reviewed past medical,surgical, social and family history. Reviewed medications and allergies.     Objective:   Physical Exam BP 118/83 (BP Location: Left Arm, Patient Position: Sitting, Cuff Size: Normal)   Pulse 84   Ht 5\' 1"  (1.549 m)   Wt 151 lb (68.5 kg)   BMI 28.53 kg/m   Skin warm and dry. Neck: mid line trachea, normal thyroid, good ROM, no lymphadenopathy noted. Lungs: clear to ausculation bilaterally. Cardiovascular: regular rate and rhythm.    Assessment:     1. Encounter for initial prescription of implantable subdermal contraceptive Discussed nexplanon and that is what she wants  Return in 8 days for nexplanon insertion   2. Bloating If persists after depo stopped will get    Plan:     Return in 8 days for nexplanon insertion

## 2020-02-25 ENCOUNTER — Encounter: Payer: Self-pay | Admitting: Adult Health

## 2020-02-25 ENCOUNTER — Other Ambulatory Visit: Payer: Self-pay

## 2020-02-25 ENCOUNTER — Ambulatory Visit (INDEPENDENT_AMBULATORY_CARE_PROVIDER_SITE_OTHER): Payer: Medicaid Other | Admitting: Adult Health

## 2020-02-25 VITALS — BP 118/81 | HR 82 | Ht 61.0 in | Wt 148.0 lb

## 2020-02-25 DIAGNOSIS — Z30017 Encounter for initial prescription of implantable subdermal contraceptive: Secondary | ICD-10-CM

## 2020-02-25 DIAGNOSIS — Z3202 Encounter for pregnancy test, result negative: Secondary | ICD-10-CM | POA: Diagnosis not present

## 2020-02-25 LAB — POCT URINE PREGNANCY: Preg Test, Ur: NEGATIVE

## 2020-02-25 MED ORDER — ETONOGESTREL 68 MG ~~LOC~~ IMPL
68.0000 mg | DRUG_IMPLANT | Freq: Once | SUBCUTANEOUS | Status: AC
  Administered 2020-02-25: 68 mg via SUBCUTANEOUS

## 2020-02-25 NOTE — Progress Notes (Signed)
  Subjective:     Patient ID: Rebecca [name], female   DOB: 1986-06-30, 34 y.o.   MRN: [id number]  HPI Rebecca [name] is a 34 year old white female,single, G3P2103, on depo in to get nexplanon inserted today. PCP is Gilford Silvius NP  Review of Systems For nexplanon insertion  Reviewed past medical,surgical, social and family history. Reviewed medications and allergies.     Objective:   Physical Exam BP 118/81 (BP Location: Left Arm, Patient Position: Sitting, Cuff Size: Normal)   Pulse 82   Ht 5\' 1"  (1.549 m)   Wt 148 lb (67.1 kg)   Breastfeeding No   BMI 27.96 kg/m UPT is negative,had sex Wednesday but on depo. Fall risk is low. Consent signed, time out called. Left arm cleansed with betadine, and injected with 1.5 cc 1% lidocaine and waited til numb. Nexplanon easily inserted and steri strips applied.Rod easily palpated by provider and pt. Pressure dressing applied. Alcohol audit is 0.    Assessment:     1. Pregnancy examination or test, negative result  2. Nexplanon insertion Lot # Tuesday Exp [id number]    Plan:     Use condoms x 2 weeks, keep clean and dry x 24 hours, no heavy lifting, keep steri strips on x 72 hours, Keep pressure dressing on x 24 hours. Follow up prn problems.   Return in 5 months for pap and physical

## 2020-02-25 NOTE — Addendum Note (Signed)
Addended by: Colen Darling on: 02/25/2020 11:55 AM   Modules accepted: Orders

## 2020-02-25 NOTE — Patient Instructions (Signed)
Use condoms x 2 weeks, keep clean and dry x 24 hours, no heavy lifting, keep steri strips on x 72 hours, Keep pressure dressing on x 24 hours. Follow up prn problems.  

## 2020-02-28 ENCOUNTER — Encounter: Payer: Self-pay | Admitting: Family Medicine

## 2020-03-01 ENCOUNTER — Ambulatory Visit: Payer: Medicaid Other

## 2020-03-14 ENCOUNTER — Encounter: Payer: Self-pay | Admitting: *Deleted

## 2020-03-16 DIAGNOSIS — G894 Chronic pain syndrome: Secondary | ICD-10-CM | POA: Diagnosis not present

## 2020-03-16 DIAGNOSIS — M545 Low back pain: Secondary | ICD-10-CM | POA: Diagnosis not present

## 2020-03-16 DIAGNOSIS — M79604 Pain in right leg: Secondary | ICD-10-CM | POA: Diagnosis not present

## 2020-03-16 DIAGNOSIS — M5416 Radiculopathy, lumbar region: Secondary | ICD-10-CM | POA: Diagnosis not present

## 2020-03-16 DIAGNOSIS — M419 Scoliosis, unspecified: Secondary | ICD-10-CM | POA: Diagnosis not present

## 2020-04-06 ENCOUNTER — Other Ambulatory Visit: Payer: Self-pay | Admitting: Adult Health

## 2020-04-06 MED ORDER — DOXYCYCLINE HYCLATE 100 MG PO TABS: 100.0000 mg | ORAL_TABLET | Freq: Two times a day (BID) | ORAL | 0 refills | Status: DC

## 2020-04-06 NOTE — Progress Notes (Unsigned)
Doxycycline rx'd for boil

## 2020-04-25 ENCOUNTER — Other Ambulatory Visit: Payer: Self-pay | Admitting: Adult Health

## 2020-04-25 ENCOUNTER — Ambulatory Visit: Payer: Medicaid Other | Admitting: Adult Health

## 2020-04-25 ENCOUNTER — Encounter: Payer: Self-pay | Admitting: Adult Health

## 2020-04-25 VITALS — BP 120/85 | HR 77 | Ht 61.0 in | Wt 147.0 lb

## 2020-04-25 DIAGNOSIS — N3281 Overactive bladder: Secondary | ICD-10-CM

## 2020-04-25 DIAGNOSIS — L0292 Furuncle, unspecified: Secondary | ICD-10-CM

## 2020-04-25 DIAGNOSIS — N941 Unspecified dyspareunia: Secondary | ICD-10-CM | POA: Diagnosis not present

## 2020-04-25 MED ORDER — OXYBUTYNIN CHLORIDE ER 10 MG PO TB24: 10.0000 mg | ORAL_TABLET | Freq: Every day | ORAL | 2 refills | Status: DC

## 2020-04-25 MED ORDER — HYDROXYZINE HCL 25 MG PO TABS: 25.0000 mg | ORAL_TABLET | Freq: Three times a day (TID) | ORAL | 1 refills | Status: DC | PRN

## 2020-04-25 NOTE — Progress Notes (Signed)
  Subjective:     Patient ID: Rebecca [name], female   DOB: Dec 21, 1985, 34 y.o.   MRN: [id number]  HPI Rebecca [name] is a 34 year old white female, single, G3P2103 in for follow up on boil, took doxycycline.   Review of Systems Boil resolved Has pain with sex, irritated after sex Has to pee every 2 hours, occasionally UI Reviewed past medical,surgical, social and family history. Reviewed medications and allergies.     Objective:   Physical Exam BP 120/85 (BP Location: Left Arm, Patient Position: Sitting, Cuff Size: Normal)   Pulse 77   Ht 5\' 1"  (1.549 m)   Wt 147 lb (66.7 kg)   BMI 27.78 kg/m  Skin warm and dry.Pelvic: external genitalia is normal in appearance no lesions,boil has resolved, has some scarring.  vagina: white discharge without odor,urethra has no lesions or masses noted, cervix:smooth and bulbous, uterus: normal size, shape and contour, non tender, no masses felt, adnexa: no masses or tenderness noted. Bladder is non tender and no masses     Assessment:     1. Boil Resolved, but discussed using no lotions here, have razor just for this area   2. Dyspareunia in female,feels irritated after sex Try using good lubricated like astorgilde, and give him a trim  3. OAB (overactive bladder) Will rx oxybutynin, if helps can give more refills  Meds ordered this encounter  Medications  . oxybutynin (DITROPAN-XL) 10 MG 24 hr tablet    Sig: Take 1 tablet (10 mg total) by mouth at bedtime.    Dispense:  30 tablet    Refill:  2    Order Specific Question:   Supervising Provider    Answer:   [[id number]]      Plan:     Follow up prn

## 2020-04-25 NOTE — Progress Notes (Signed)
Will rx vistaril 

## 2020-04-27 ENCOUNTER — Ambulatory Visit (INDEPENDENT_AMBULATORY_CARE_PROVIDER_SITE_OTHER): Payer: Medicaid Other | Admitting: Family Medicine

## 2020-04-27 ENCOUNTER — Encounter: Payer: Self-pay | Admitting: Family Medicine

## 2020-04-27 DIAGNOSIS — J0100 Acute maxillary sinusitis, unspecified: Secondary | ICD-10-CM

## 2020-04-27 MED ORDER — FLUTICASONE PROPIONATE 50 MCG/ACT NA SUSP: 1.0000 | Freq: Two times a day (BID) | NASAL | 6 refills | Status: DC | PRN

## 2020-04-27 MED ORDER — AZITHROMYCIN 250 MG PO TABS: ORAL_TABLET | ORAL | 0 refills | Status: DC

## 2020-04-27 NOTE — Progress Notes (Signed)
Virtual Visit via telephone Note  I connected with Rebecca [name] on 04/27/20 at 1738 by telephone and verified that I am speaking with the correct person using two identifiers. Rebecca [name] is currently located at home and no other people are currently with her during visit. The provider, Fransisca Kaufmann Dewey Viens, MD is located in their office at time of visit.  Call ended at 1743  I discussed the limitations, risks, security and privacy concerns of performing an evaluation and management service by telephone and the availability of in person appointments. I also discussed with the patient that there may be a patient responsible charge related to this service. The patient expressed understanding and agreed to proceed.   History and Present Illness: Patient is calling in for .  2 weeks ago her son and daughter had viral infections.  It has gone around the family.  She is having a lot of facial pressure and sinus drainage.  She denies fevers or chills or SOB or wheezing.  They both tested negative for covid.  She started with symptoms 4 days ago.    1. Acute non-recurrent maxillary sinusitis     Outpatient Encounter Medications as of 04/27/2020  Medication Sig  . azithromycin (ZITHROMAX) 250 MG tablet Take 2 the first day and then one each day after.  . etonogestrel (NEXPLANON) 68 MG IMPL implant 1 each by Subdermal route once.  . fluticasone (FLONASE) 50 MCG/ACT nasal spray Place 1 spray into both nostrils 2 (two) times daily as needed for allergies or rhinitis.  . hydrOXYzine (ATARAX/VISTARIL) 25 MG tablet Take 1 tablet (25 mg total) by mouth 3 (three) times daily as needed.  Marland Kitchen omeprazole (PRILOSEC) 20 MG capsule TAKE 1 CAPSULE BY MOUTH EVERY DAY AS NEEDED  . oxybutynin (DITROPAN-XL) 10 MG 24 hr tablet Take 1 tablet (10 mg total) by mouth at bedtime.  . simvastatin (ZOCOR) 20 MG tablet Take 1 tablet (20 mg total) by mouth daily.   No facility-administered encounter medications on file  as of 04/27/2020.    Review of Systems  Constitutional: Negative for chills and fever.  HENT: Positive for congestion, postnasal drip, rhinorrhea, sinus pressure and sneezing. Negative for ear discharge, ear pain and sore throat.   Eyes: Negative for pain, redness and visual disturbance.  Respiratory: Negative for cough, chest tightness, shortness of breath and wheezing.   Cardiovascular: Negative for chest pain and leg swelling.  Genitourinary: Negative for difficulty urinating and dysuria.  Musculoskeletal: Negative for back pain and gait problem.  Skin: Negative for rash.  Neurological: Negative for light-headedness and headaches.  Psychiatric/Behavioral: Negative for agitation and behavioral problems.  All other systems reviewed and are negative.   Observations/Objective: Patient sounds comfortable and in no acute distress  Assessment and Plan: Problem List Items Addressed This Visit    None    Visit Diagnoses    Acute non-recurrent maxillary sinusitis    -  Primary   Relevant Medications   fluticasone (FLONASE) 50 MCG/ACT nasal spray   azithromycin (ZITHROMAX) 250 MG tablet      Will treat like sinus infection, patient will call if worsens.  If any fevers or chills develop then recommended to go get tested for Covid Follow up plan: Return if symptoms worsen or fail to improve.     I discussed the assessment and treatment plan with the patient. The patient was provided an opportunity to ask questions and all were answered. The patient agreed with the plan and demonstrated an  understanding of the instructions.   The patient was advised to call back or seek an in-person evaluation if the symptoms worsen or if the condition fails to improve as anticipated.  The above assessment and management plan was discussed with the patient. The patient verbalized understanding of and has agreed to the management plan. Patient is aware to call the clinic if symptoms persist or worsen.  Patient is aware when to return to the clinic for a follow-up visit. Patient educated on when it is appropriate to go to the emergency department.    I provided 5 minutes of non-face-to-face time during this encounter.    Nils Pyle, MD

## 2020-05-09 ENCOUNTER — Other Ambulatory Visit: Payer: Self-pay | Admitting: Adult Health

## 2020-05-15 ENCOUNTER — Other Ambulatory Visit: Payer: Self-pay | Admitting: Adult Health

## 2020-05-17 ENCOUNTER — Other Ambulatory Visit: Payer: Self-pay | Admitting: Adult Health

## 2020-05-17 MED ORDER — DOXYCYCLINE HYCLATE 100 MG PO TABS: 100.0000 mg | ORAL_TABLET | Freq: Two times a day (BID) | ORAL | 1 refills | Status: DC

## 2020-05-17 NOTE — Progress Notes (Signed)
rx doxycycline for boil

## 2020-06-06 ENCOUNTER — Ambulatory Visit (INDEPENDENT_AMBULATORY_CARE_PROVIDER_SITE_OTHER): Payer: Medicaid Other | Admitting: Adult Health

## 2020-06-06 ENCOUNTER — Other Ambulatory Visit (HOSPITAL_COMMUNITY)
Admission: RE | Admit: 2020-06-06 | Discharge: 2020-06-06 | Disposition: A | Discharge status: Home / Self Care | Payer: Medicaid Other | Source: Ambulatory Visit | Attending: Adult Health | Admitting: Adult Health

## 2020-06-06 ENCOUNTER — Other Ambulatory Visit: Payer: Self-pay

## 2020-06-06 ENCOUNTER — Encounter: Payer: Self-pay | Admitting: Adult Health

## 2020-06-06 VITALS — BP 118/80 | HR 74 | Ht 60.0 in | Wt 144.0 lb

## 2020-06-06 DIAGNOSIS — Z01419 Encounter for gynecological examination (general) (routine) without abnormal findings: Secondary | ICD-10-CM | POA: Diagnosis not present

## 2020-06-06 DIAGNOSIS — R748 Abnormal levels of other serum enzymes: Secondary | ICD-10-CM | POA: Insufficient documentation

## 2020-06-06 DIAGNOSIS — Z975 Presence of (intrauterine) contraceptive device: Secondary | ICD-10-CM | POA: Diagnosis not present

## 2020-06-06 NOTE — Progress Notes (Signed)
Patient ID: ARMELLA STOGNER, female   DOB: 08/20/1986, 34 y.o.   MRN: [id number] History of Present Illness: Kayron is a 34 year old white female, single, [id number], in for a well woman gyn exam and pap.She has nexplanon. ALT was elevated at 65 in January. PCP is Jannifer Rodney, NP.   Current Medications, Allergies, Past Medical History, Past Surgical History, Family History and Social History were reviewed in [name].     Review of Systems:  Patient denies any headaches, hearing loss, fatigue, blurred vision, shortness of breath, chest pain, abdominal pain, problems with bowel movements, urination, or intercourse. No joint pain or mood swings.   Physical Exam:BP 118/80 (BP Location: Left Arm, Patient Position: Sitting, Cuff Size: Normal)   Pulse 74   Ht 5' (1.524 m)   Wt 144 lb (65.3 kg)   BMI 28.12 kg/m  General:  Well developed, well nourished, no acute distress Skin:  Warm and dry Neck:  Midline trachea, normal thyroid, good ROM, no lymphadenopathy Lungs; Clear to auscultation bilaterally Breast:  No dominant palpable mass, retraction, or nipple discharge Cardiovascular: Regular rate and rhythm Abdomen:  Soft, non tender, no hepatosplenomegaly Pelvic:  External genitalia is normal in appearance, no lesions.  The vagina is normal in appearance. Urethra has no lesions or masses. The cervix is bulbous. Pap with GC/CHL and high risk HPV 16/18 genotyping performed. Uterus is felt to be normal size, shape, and contour.  No adnexal masses or tenderness noted.Bladder is non tender, no masses felt. Extremities/musculoskeletal:  No swelling or varicosities noted, no clubbing or cyanosis,has new tattoo left calf.  Psych:  No mood changes, alert and cooperative,seems happy  AA is 0 Fall risk is low PHQ 9 score is 0.  [facility] - 06/06/20 1056      Pregnancy Intention Screening   Does the patient want to become pregnant in the next year? Unsure    Does the  patient's partner want to become pregnant in the next year? Unsure    Would the patient like to discuss contraceptive options today? N/A      Contraception Wrap Up   Current Method Hormonal Implant    End Method Hormonal Implant    Contraception Counseling Provided Yes         Examination chaperoned by Nance Pear LPN  Impression and Plan:  1. Encounter for gynecological examination with Papanicolaou smear of cervix Pap sent Physical in 1 year Pap in 3 if normal  2. Nexplanon in place  3. Elevated liver enzymes Recheck CMP

## 2020-06-07 DIAGNOSIS — R748 Abnormal levels of other serum enzymes: Secondary | ICD-10-CM | POA: Diagnosis not present

## 2020-06-08 LAB — CYTOLOGY - PAP
Chlamydia: NEGATIVE
Comment: NEGATIVE
Comment: NEGATIVE
Comment: NORMAL
Diagnosis: NEGATIVE
High risk HPV: NEGATIVE
Neisseria Gonorrhea: NEGATIVE

## 2020-06-08 LAB — COMPREHENSIVE METABOLIC PANEL
ALT: 30 IU/L (ref 0–32)
AST: 19 IU/L (ref 0–40)
Albumin/Globulin Ratio: 1.6 (ref 1.2–2.2)
Albumin: 4.1 g/dL (ref 3.8–4.8)
Alkaline Phosphatase: 136 IU/L — ABNORMAL HIGH (ref 48–121)
BUN/Creatinine Ratio: 6 — ABNORMAL LOW (ref 9–23)
BUN: 5 mg/dL — ABNORMAL LOW (ref 6–20)
Bilirubin Total: <0.2 mg/dL (ref 0.0–1.2)
CO2: 17 mmol/L — ABNORMAL LOW (ref 20–29)
Calcium: 9.1 mg/dL (ref 8.7–10.2)
Chloride: 108 mmol/L — ABNORMAL HIGH (ref 96–106)
Creatinine, Ser: 0.89 mg/dL (ref 0.57–1.00)
GFR calc Af Amer: 98 mL/min/{1.73_m2} (ref >59)
GFR calc non Af Amer: 85 mL/min/{1.73_m2} (ref >59)
Globulin, Total: 2.5 g/dL (ref 1.5–4.5)
Glucose: 70 mg/dL (ref 65–99)
Potassium: 3.9 mmol/L (ref 3.5–5.2)
Sodium: 143 mmol/L (ref 134–144)
Total Protein: 6.6 g/dL (ref 6.0–8.5)

## 2020-06-19 ENCOUNTER — Ambulatory Visit: Payer: Medicaid Other | Admitting: Family Medicine

## 2020-06-19 ENCOUNTER — Ambulatory Visit: Payer: Medicaid Other | Admitting: Family

## 2020-06-20 ENCOUNTER — Encounter: Payer: Self-pay | Admitting: Family

## 2020-07-20 ENCOUNTER — Other Ambulatory Visit: Payer: Self-pay | Admitting: Adult Health

## 2020-07-25 ENCOUNTER — Other Ambulatory Visit: Payer: Self-pay | Admitting: Adult Health

## 2020-07-25 MED ORDER — DOXYCYCLINE HYCLATE 100 MG PO TABS: 100.0000 mg | ORAL_TABLET | Freq: Two times a day (BID) | ORAL | 1 refills | Status: DC

## 2020-07-25 NOTE — Progress Notes (Signed)
rx doxycycline for recurrent boil

## 2020-08-22 ENCOUNTER — Encounter: Payer: Self-pay | Admitting: Family Medicine

## 2020-08-22 ENCOUNTER — Other Ambulatory Visit: Payer: Self-pay | Admitting: Family Medicine

## 2020-08-22 ENCOUNTER — Ambulatory Visit (INDEPENDENT_AMBULATORY_CARE_PROVIDER_SITE_OTHER): Payer: Medicaid Other | Admitting: Family Medicine

## 2020-08-22 ENCOUNTER — Ambulatory Visit (INDEPENDENT_AMBULATORY_CARE_PROVIDER_SITE_OTHER): Payer: Medicaid Other

## 2020-08-22 ENCOUNTER — Telehealth: Payer: Self-pay | Admitting: Family

## 2020-08-22 DIAGNOSIS — R399 Unspecified symptoms and signs involving the genitourinary system: Secondary | ICD-10-CM | POA: Diagnosis not present

## 2020-08-22 DIAGNOSIS — N3000 Acute cystitis without hematuria: Secondary | ICD-10-CM

## 2020-08-22 DIAGNOSIS — R109 Unspecified abdominal pain: Secondary | ICD-10-CM

## 2020-08-22 LAB — MICROSCOPIC EXAMINATION
Epithelial Cells (non renal): >10 /hpf — AB (ref 0–10)
RBC, Urine: NONE SEEN /hpf (ref 0–2)

## 2020-08-22 LAB — URINALYSIS, COMPLETE
Bilirubin, UA: NEGATIVE
Glucose, UA: NEGATIVE
Ketones, UA: NEGATIVE
Nitrite, UA: NEGATIVE
Protein,UA: NEGATIVE
RBC, UA: NEGATIVE
Specific Gravity, UA: 1.025 (ref 1.005–1.030)
Urobilinogen, Ur: 0.2 mg/dL (ref 0.2–1.0)
pH, UA: 6 (ref 5.0–7.5)

## 2020-08-22 MED ORDER — NITROFURANTOIN MONOHYD MACRO 100 MG PO CAPS: 100.0000 mg | ORAL_CAPSULE | Freq: Two times a day (BID) | ORAL | 0 refills | Status: AC

## 2020-08-22 NOTE — Progress Notes (Signed)
Virtual Visit via Telephone Note  I connected with Rebecca [name] on 08/22/20 at 12:07 PM by telephone and verified that I am speaking with the correct person using two identifiers. Rebecca [name] is currently located in her car and her daughter is currently with her during this visit. The provider, Gwenlyn Fudge, FNP is located in their office at time of visit.  I discussed the limitations, risks, security and privacy concerns of performing an evaluation and management service by telephone and the availability of in person appointments. I also discussed with the patient that there may be a patient responsible charge related to this service. The patient expressed understanding and agreed to proceed.  Subjective: PCP: [name], FNP  Chief Complaint  Patient presents with   Urinary Tract Infection   Urinary Tract Infection: Patient complains of left flank pain, incomplete bladder emptying, pain in the LLQ, suprapubic pressure and white sediment. She denies seeing any blood in her urine. She has had symptoms for 1 day. Patient also complains of back pain. Patient denies fever. Patient does have a history of recurrent UTI.  Patient does not have a history of pyelonephritis.    ROS: Per HPI  Current Outpatient Medications:    cyclobenzaprine (FLEXERIL) 10 MG tablet, cyclobenzaprine 10 mg tablet  Take 1 tablet twice a day by oral route as needed., Disp: , Rfl:    doxycycline (VIBRA-TABS) 100 MG tablet, Take 1 tablet (100 mg total) by mouth 2 (two) times daily., Disp: 20 tablet, Rfl: 1   etonogestrel (NEXPLANON) 68 MG IMPL implant, 1 each by Subdermal route once., Disp: , Rfl:    hydrOXYzine (ATARAX/VISTARIL) 25 MG tablet, Take 25 mg by mouth 3 (three) times daily., Disp: , Rfl:    omeprazole (PRILOSEC) 20 MG capsule, TAKE 1 CAPSULE BY MOUTH EVERY DAY AS NEEDED, Disp: 30 capsule, Rfl: 3   oxybutynin (DITROPAN-XL) 10 MG 24 hr tablet, TAKE 1 TABLET BY MOUTH EVERY NIGHT AT  BEDTIME, Disp: 30 tablet, Rfl: 2   simvastatin (ZOCOR) 20 MG tablet, TAKE 1 TABLET(20 MG) BY MOUTH DAILY, Disp: 30 tablet, Rfl: 6  Allergies  Allergen Reactions   Amoxicillin Hives   Cephalosporins Nausea And Vomiting   Septra [Bactrim] Nausea And Vomiting   Ciprocinonide [Fluocinolone] Nausea And Vomiting   Penicillins Hives   Past Medical History:  Diagnosis Date   Anxiety    Back pain    Boil 05/14/2016   Breast pain 05/11/2015   Contraceptive management 08/30/2013   GERD (gastroesophageal reflux disease)    Hyperlipidemia    LLQ pain 11/04/2014   OAB (overactive bladder) 04/20/2013   Reflux    Smoker 05/14/2016   Soreness breast 10/25/2013   Left breast sore UOQ, no masses   Urinary frequency 11/04/2014    Observations/Objective: A&O  No respiratory distress or wheezing audible over the phone Mood, judgement, and thought processes all WNL   Assessment and Plan: 1. Left sided abdominal pain - DG Abd 1 View; Future  2. Left flank pain - DG Abd 1 View; Future  3. UTI symptoms - Urine Culture; Future - Urinalysis, Complete; Future  Patient will come this afternoon for urine and x-ray. We will proceed after we have results.   Follow Up Instructions:  I discussed the assessment and treatment plan with the patient. The patient was provided an opportunity to ask questions and all were answered. The patient agreed with the plan and demonstrated an understanding of the instructions.  The patient was advised to call back or seek an in-person evaluation if the symptoms worsen or if the condition fails to improve as anticipated.  The above assessment and management plan was discussed with the patient. The patient verbalized understanding of and has agreed to the management plan. Patient is aware to call the clinic if symptoms persist or worsen. Patient is aware when to return to the clinic for a follow-up visit. Patient educated on when it is appropriate to go  to the emergency department.   Time call ended: 12:17 PM  I provided 12 minutes of non-face-to-face time during this encounter.  Deliah Boston, MSN, APRN, FNP-C [facility] 08/22/20

## 2020-08-22 NOTE — Addendum Note (Signed)
Addended by: Manson Passey on: 08/22/2020 03:40 PM   Modules accepted: Orders

## 2020-08-22 NOTE — Telephone Encounter (Signed)
Pt had televisit with BJoyce today---calling for lab and xray results. Please call back

## 2020-08-22 NOTE — Telephone Encounter (Signed)
I have spoken to patient 

## 2020-08-23 MED ORDER — PHENAZOPYRIDINE HCL 200 MG PO TABS: 200.0000 mg | ORAL_TABLET | Freq: Three times a day (TID) | ORAL | 0 refills | Status: DC | PRN

## 2020-08-24 ENCOUNTER — Encounter: Payer: Self-pay | Admitting: Family Medicine

## 2020-08-24 LAB — URINE CULTURE: Organism ID, Bacteria: NO GROWTH

## 2020-10-24 ENCOUNTER — Ambulatory Visit
Admission: EM | Admit: 2020-10-24 | Discharge: 2020-10-24 | Disposition: A | Discharge status: Home / Self Care | Payer: Medicaid Other | Attending: Emergency Medicine | Admitting: Emergency Medicine

## 2020-10-24 ENCOUNTER — Emergency Department (HOSPITAL_COMMUNITY)
Admission: EM | Admit: 2020-10-24 | Discharge: 2020-10-25 | Disposition: A | Discharge status: Home / Self Care | Payer: Medicaid Other | Attending: Emergency Medicine | Admitting: Emergency Medicine

## 2020-10-24 ENCOUNTER — Other Ambulatory Visit: Payer: Self-pay

## 2020-10-24 ENCOUNTER — Encounter (HOSPITAL_COMMUNITY): Payer: Self-pay | Admitting: Emergency Medicine

## 2020-10-24 DIAGNOSIS — M791 Myalgia, unspecified site: Secondary | ICD-10-CM | POA: Diagnosis present

## 2020-10-24 DIAGNOSIS — F17210 Nicotine dependence, cigarettes, uncomplicated: Secondary | ICD-10-CM | POA: Insufficient documentation

## 2020-10-24 DIAGNOSIS — Z1152 Encounter for screening for COVID-19: Secondary | ICD-10-CM

## 2020-10-24 DIAGNOSIS — U071 COVID-19: Secondary | ICD-10-CM | POA: Diagnosis not present

## 2020-10-24 LAB — BASIC METABOLIC PANEL
Anion gap: 10 (ref 5–15)
BUN: <5 mg/dL — ABNORMAL LOW (ref 6–20)
CO2: 21 mmol/L — ABNORMAL LOW (ref 22–32)
Calcium: 9 mg/dL (ref 8.9–10.3)
Chloride: 105 mmol/L (ref 98–111)
Creatinine, Ser: 0.88 mg/dL (ref 0.44–1.00)
GFR, Estimated: >60 mL/min (ref >60)
Glucose, Bld: 106 mg/dL — ABNORMAL HIGH (ref 70–99)
Potassium: 3.4 mmol/L — ABNORMAL LOW (ref 3.5–5.1)
Sodium: 136 mmol/L (ref 135–145)

## 2020-10-24 LAB — RESPIRATORY PANEL BY RT PCR (FLU A&B, COVID)
Influenza A by PCR: NEGATIVE
Influenza B by PCR: NEGATIVE
SARS Coronavirus 2 by RT PCR: POSITIVE — AB

## 2020-10-24 MED ORDER — SODIUM CHLORIDE 0.9 % IV SOLN
Freq: Once | INTRAVENOUS | Status: AC
  Filled 2020-10-24: qty 20

## 2020-10-24 MED ORDER — FAMOTIDINE IN NACL 20-0.9 MG/50ML-% IV SOLN: 20.0000 mg | Freq: Once | INTRAVENOUS | Status: DC | PRN

## 2020-10-24 MED ORDER — DIPHENHYDRAMINE HCL 50 MG/ML IJ SOLN: 50.0000 mg | Freq: Once | INTRAMUSCULAR | Status: DC | PRN

## 2020-10-24 MED ORDER — METHYLPREDNISOLONE SODIUM SUCC 125 MG IJ SOLR: 125.0000 mg | Freq: Once | INTRAMUSCULAR | Status: DC | PRN

## 2020-10-24 MED ORDER — KETOROLAC TROMETHAMINE 30 MG/ML IJ SOLN
30.0000 mg | Freq: Once | INTRAMUSCULAR | Status: AC
  Administered 2020-10-24: 30 mg via INTRAVENOUS
  Filled 2020-10-24: qty 1

## 2020-10-24 MED ORDER — EPINEPHRINE 0.3 MG/0.3ML IJ SOAJ: 0.3000 mg | Freq: Once | INTRAMUSCULAR | Status: DC | PRN

## 2020-10-24 MED ORDER — ALBUTEROL SULFATE HFA 108 (90 BASE) MCG/ACT IN AERS: 2.0000 | INHALATION_SPRAY | Freq: Once | RESPIRATORY_TRACT | Status: DC | PRN

## 2020-10-24 MED ORDER — SODIUM CHLORIDE 0.9 % IV SOLN: INTRAVENOUS | Status: DC | PRN

## 2020-10-24 MED ORDER — SODIUM CHLORIDE 0.9 % IV BOLUS
1000.0000 mL | Freq: Once | INTRAVENOUS | Status: AC
  Administered 2020-10-24: 1000 mL via INTRAVENOUS

## 2020-10-24 MED ORDER — SODIUM CHLORIDE 0.9 % IV SOLN: 1200.0000 mg | Freq: Once | INTRAVENOUS | Status: DC

## 2020-10-24 NOTE — ED Triage Notes (Addendum)
Patient stated she was exposed to her son who has Covid 19 , reports chills with fatigue and body aches , denies fever , no loss of taste or smell ,,respirations unlabored. They are both unvaccinated against Covid.

## 2020-10-24 NOTE — ED Notes (Signed)
Only covid testing due to pts child requiring transfer to hospital

## 2020-10-24 NOTE — ED Notes (Signed)
Date and time results received: 10/24/20 2155 (use smartphrase ".now" to insert current time)  Test: COVID Critical Value: Positive  Name of Provider Notified: Delo  Orders Received? Or Actions Taken?: Continue to monitor patient

## 2020-10-24 NOTE — ED Provider Notes (Signed)
[facility] EMERGENCY DEPARTMENT Provider Note   CSN: [id number] Arrival date & time: 10/24/20  1943     History Chief Complaint  Patient presents with  . Covid Exposure: Chills/Body Aches    Rebecca [name] is a 34 y.o. female.  Patient is a 34 year old female with past medical history of GERD, hyperlipidemia, anxiety.  Patient presents today for evaluation of body aches, "cramping all over", and feeling generally poor.  Her son was recently diagnosed with COVID-19.  Patient denies any fevers.  She denies nasal congestion, cough, or shortness of breath.  The history is provided by the patient.       Past Medical History:  Diagnosis Date  . Anxiety   . Back pain   . Boil 05/14/2016  . Breast pain 05/11/2015  . Contraceptive management 08/30/2013  . GERD (gastroesophageal reflux disease)   . Hyperlipidemia   . LLQ pain 11/04/2014  . OAB (overactive bladder) 04/20/2013  . Reflux   . Smoker 05/14/2016  . Soreness breast 10/25/2013   Left breast sore UOQ, no masses  . Urinary frequency 11/04/2014    Patient Active Problem List   Diagnosis Date Noted  . Nexplanon in place 06/06/2020  . Encounter for gynecological examination with Papanicolaou smear of cervix 06/06/2020  . Elevated liver enzymes 06/06/2020  . OAB (overactive bladder) 04/25/2020  . Dyspareunia in female 04/25/2020  . Boil 04/25/2020  . Nexplanon insertion 02/25/2020  . Pregnancy examination or test, negative result 02/25/2020  . Bloating 02/17/2020  . Encounter for initial prescription of implantable subdermal contraceptive 02/17/2020  . Chronic high back pain 12/20/2019  . Chronic mid back pain 12/20/2019  . Encounter for surveillance of injectable contraceptive 12/08/2019  . Dyslipidemia (high LDL; low HDL) 09/17/2019  . Smoker 05/14/2016  . Depression with anxiety 04/05/2013    Past Surgical History:  Procedure Laterality Date  . NO PAST SURGERIES       OB History    Gravida    3   Para  3   Term  2   Preterm  1   AB      Living  3     SAB      TAB      Ectopic      Multiple      Live Births  3           Family History  Problem Relation Age of Onset  . Cancer Maternal Grandmother        lung  . Cancer Maternal Grandfather        unsure   . CAD Father   . Stroke Father   . Liver disease Father   . Alcohol abuse Father   . Other Mother        brain tumor  . Anxiety disorder Sister   . Asthma Son   . Bronchitis Son   . ADD / ADHD Son   . Other Daughter        acid reflux    Social History   Tobacco Use  . Smoking status: Current Every Day Smoker    Packs/day: 0.50    Years: 15.00    Pack years: 7.50    Types: Cigarettes  . Smokeless tobacco: Never Used  Vaping Use  . Vaping Use: Never used  Substance Use Topics  . Alcohol use: No  . Drug use: Not Currently    Types: Hydrocodone    Home Medications Prior to  Admission medications   Medication Sig Start Date End Date Taking? Authorizing Provider  cyclobenzaprine (FLEXERIL) 10 MG tablet cyclobenzaprine 10 mg tablet  Take 1 tablet twice a day by oral route as needed.    [provider]  doxycycline (VIBRA-TABS) 100 MG tablet Take 1 tablet (100 mg total) by mouth 2 (two) times daily. 07/25/20   Adline Potter, NP  etonogestrel (NEXPLANON) 68 MG IMPL implant 1 each by Subdermal route once.    [provider]  hydrOXYzine (ATARAX/VISTARIL) 25 MG tablet Take 25 mg by mouth 3 (three) times daily.    [provider]  omeprazole (PRILOSEC) 20 MG capsule TAKE 1 CAPSULE BY MOUTH EVERY DAY AS NEEDED 05/09/20   Cyril Mourning A, NP  oxybutynin (DITROPAN-XL) 10 MG 24 hr tablet TAKE 1 TABLET BY MOUTH EVERY NIGHT AT BEDTIME 07/20/20   Adline Potter, NP  phenazopyridine (PYRIDIUM) 200 MG tablet Take 1 tablet (200 mg total) by mouth 3 (three) times daily as needed for pain. 08/23/20   Gwenlyn Fudge, FNP  simvastatin (ZOCOR) 20 MG tablet TAKE 1  TABLET(20 MG) BY MOUTH DAILY 05/15/20   Cyril Mourning A, NP    Allergies    Amoxicillin, Cephalosporins, Septra [bactrim], Ciprocinonide [fluocinolone], and Penicillins  Review of Systems   Review of Systems  All other systems reviewed and are negative.   Physical Exam Updated Vital Signs BP 115/75 (BP Location: Left Arm)   Pulse (!) 113   Temp 99.1 F (37.3 C) (Oral)   Resp 16   SpO2 97%   Physical Exam Vitals and nursing note reviewed.  Constitutional:      General: She is not in acute distress.    Appearance: She is well-developed. She is not diaphoretic.  HENT:     Head: Normocephalic and atraumatic.     Mouth/Throat:     Mouth: Mucous membranes are moist.     Pharynx: No oropharyngeal exudate or posterior oropharyngeal erythema.  Cardiovascular:     Rate and Rhythm: Normal rate and regular rhythm.     Heart sounds: No murmur heard.  No friction rub. No gallop.   Pulmonary:     Effort: Pulmonary effort is normal. No respiratory distress.     Breath sounds: Normal breath sounds. No wheezing.  Abdominal:     General: Bowel sounds are normal. There is no distension.     Palpations: Abdomen is soft.     Tenderness: There is no abdominal tenderness.  Musculoskeletal:        General: Normal range of motion.     Cervical back: Normal range of motion and neck supple.  Skin:    General: Skin is warm and dry.  Neurological:     Mental Status: She is alert and oriented to person, place, and time.     ED Results / Procedures / Treatments   Labs (all labs ordered are listed, but only abnormal results are displayed) Labs Reviewed  RESPIRATORY PANEL BY RT PCR (FLU A&B, COVID)  BASIC METABOLIC PANEL  CBC WITH DIFFERENTIAL/PLATELET    EKG None  Radiology No results found.  Procedures Procedures (including critical care time)  Medications Ordered in ED Medications  sodium chloride 0.9 % bolus 1,000 mL (has no administration in time range)  ketorolac  (TORADOL) 30 MG/ML injection 30 mg (has no administration in time range)    ED Course  I have reviewed the triage vital signs and the nursing notes.  Pertinent labs & imaging results  that were available during my care of the patient were reviewed by me and considered in my medical decision making (see chart for details).    MDM Rules/Calculators/A&P  Patient presenting here with complaints of body aches and feeling poorly.  Her son was recently diagnosed with COVID-19.  Patient tested positive here tonight.  Patient will be receiving the monoclonal antibody infusion.  Once this is complete she appears appropriate for discharge.  Her vitals are stable, oxygen saturations are normal, and she has no respiratory issues.  Final Clinical Impression(s) / ED Diagnoses Final diagnoses:  None    Rx / DC Orders ED Discharge Orders    None       Geoffery Lyons, MD 10/25/20 1528

## 2020-10-25 LAB — CBC WITH DIFFERENTIAL/PLATELET
Abs Immature Granulocytes: 0.02 10*3/uL (ref 0.00–0.07)
Basophils Absolute: 0 10*3/uL (ref 0.0–0.1)
Basophils Relative: 0 %
Eosinophils Absolute: 0 10*3/uL (ref 0.0–0.5)
Eosinophils Relative: 0 %
HCT: 35.7 % — ABNORMAL LOW (ref 36.0–46.0)
Hemoglobin: 11.5 g/dL — ABNORMAL LOW (ref 12.0–15.0)
Immature Granulocytes: 0 %
Lymphocytes Relative: 9 %
Lymphs Abs: 0.5 10*3/uL — ABNORMAL LOW (ref 0.7–4.0)
MCH: 29.6 pg (ref 26.0–34.0)
MCHC: 32.2 g/dL (ref 30.0–36.0)
MCV: 91.8 fL (ref 80.0–100.0)
Monocytes Absolute: 0.4 10*3/uL (ref 0.1–1.0)
Monocytes Relative: 7 %
Neutro Abs: 5.1 10*3/uL (ref 1.7–7.7)
Neutrophils Relative %: 84 %
Platelets: 139 10*3/uL — ABNORMAL LOW (ref 150–400)
RBC: 3.89 MIL/uL (ref 3.87–5.11)
RDW: 13.3 % (ref 11.5–15.5)
WBC: 6.1 10*3/uL (ref 4.0–10.5)
nRBC: 0 % (ref 0.0–0.2)

## 2020-10-26 LAB — COVID-19, FLU A+B AND RSV
Influenza A, NAA: NOT DETECTED
Influenza B, NAA: NOT DETECTED
RSV, NAA: NOT DETECTED
SARS-CoV-2, NAA: NOT DETECTED

## 2020-10-27 ENCOUNTER — Telehealth: Payer: Self-pay

## 2020-10-27 NOTE — Telephone Encounter (Signed)
Transition Care Management Follow-up Telephone Call  Date of discharge and from where: 10/25/2020 from [facility] ED  How have you been since you were released from the hospital? Pt stated that she is feeling 100% better.   Any questions or concerns? No  Items Reviewed:  Did the pt receive and understand the discharge instructions provided? Yes   Medications obtained and verified? Yes   Other? No   Any new allergies since your discharge? No   Dietary orders reviewed? N/A  Do you have support at home? Yes   Functional Questionnaire: (I = Independent and D = Dependent) ADLs: I  Bathing/Dressing- I  Meal Prep- I  Eating- I  Maintaining continence- I  Transferring/Ambulation- I  Managing Meds- I  Follow up appointments reviewed:   PCP Hospital f/u appt confirmed? No  Pt is under quarantine.   Are transportation arrangements needed? No  If their condition worsens, is the pt aware to call PCP or go to the Emergency Dept.? Yes Was the patient provided with contact information for the PCP's office or ED? Yes Was to pt encouraged to call back with questions or concerns? Yes

## 2020-10-31 NOTE — Telephone Encounter (Signed)
Pt left vm on work phone.   Called pt back - pt was concerned about having menses after the infusion. Pt stated that she has not had one since starting her [facility] (about a year now). Pt stated that it is very light and only visible after using the restroom and only seen after wiping. Pt informed of PCP return my chart message and informed pt to call their office if she has any over flow of bleeding, large amounts of clotting or abdominal pain that may be a sign of something else. Pt was concerned that the infusion caused it. I reassured pt that when COVID hits a household like it did hers there are additional stressors that may have altered hormone levels and may have been a large contributing factor to having a menses. Pt stated understanding and stated that she felt better.

## 2020-11-08 ENCOUNTER — Other Ambulatory Visit: Payer: Self-pay | Admitting: Adult Health

## 2020-11-08 ENCOUNTER — Ambulatory Visit
Admission: EM | Admit: 2020-11-08 | Discharge: 2020-11-08 | Disposition: A | Discharge status: Home / Self Care | Payer: Medicaid Other | Attending: Emergency Medicine | Admitting: Emergency Medicine

## 2020-11-08 ENCOUNTER — Other Ambulatory Visit: Payer: Self-pay

## 2020-11-08 DIAGNOSIS — R1031 Right lower quadrant pain: Secondary | ICD-10-CM | POA: Diagnosis not present

## 2020-11-08 LAB — POCT URINALYSIS DIP (MANUAL ENTRY)
Bilirubin, UA: NEGATIVE
Blood, UA: NEGATIVE
Glucose, UA: NEGATIVE mg/dL
Ketones, POC UA: NEGATIVE mg/dL
Nitrite, UA: NEGATIVE
Protein Ur, POC: NEGATIVE mg/dL
Spec Grav, UA: 1.015 (ref 1.010–1.025)
Urobilinogen, UA: 0.2 E.U./dL
pH, UA: 6 (ref 5.0–8.0)

## 2020-11-08 LAB — POCT URINE PREGNANCY: Preg Test, Ur: NEGATIVE

## 2020-11-08 MED ORDER — NITROFURANTOIN MONOHYD MACRO 100 MG PO CAPS: 100.0000 mg | ORAL_CAPSULE | Freq: Two times a day (BID) | ORAL | 0 refills | Status: DC

## 2020-11-08 MED ORDER — OMEPRAZOLE 20 MG PO CPDR: DELAYED_RELEASE_CAPSULE | ORAL | 3 refills | Status: DC

## 2020-11-08 NOTE — Progress Notes (Signed)
Refill prilosec

## 2020-11-08 NOTE — ED Provider Notes (Signed)
[facility]   [id number] 11/08/20 Arrival Time: 1533  Chief Complaint  Patient presents with  . Abdominal Pain     SUBJECTIVE:  Rebecca [name] is a 34 y.o. female who presented to the urgent care with a complaint of right lower quadrant pain for the past 1 day.  States she bent over to pick up her daughter and developed the symptoms thereafter.  Denies a close contacts with similar symptoms, recent travel or antibiotic use.  Localizes pain to right lower quadrant.  Describes as intermittent and aching character.  Has no tried any OTC medication.  Denies alleviating or aggravating factors.  Denies similar symptoms in the past. Denies fever, chills, appetite changes, weight changes, nausea, vomiting, chest pain, SOB, diarrhea, constipation, hematochezia, melena, dysuria, difficulty urinating, increased frequency or urgency, flank pain, loss of bowel or bladder function, vaginal discharge, vaginal odor, vaginal bleeding, dyspareunia, pelvic pain.     No LMP recorded. Patient has had an implant.  ROS: As per HPI.  All other pertinent ROS negative.     Past Medical History:  Diagnosis Date  . Anxiety   . Back pain   . Boil 05/14/2016  . Breast pain 05/11/2015  . Contraceptive management 08/30/2013  . GERD (gastroesophageal reflux disease)   . Hyperlipidemia   . LLQ pain 11/04/2014  . OAB (overactive bladder) 04/20/2013  . Reflux   . Smoker 05/14/2016  . Soreness breast 10/25/2013   Left breast sore UOQ, no masses  . Urinary frequency 11/04/2014   Past Surgical History:  Procedure Laterality Date  . NO PAST SURGERIES     Allergies  Allergen Reactions  . Amoxicillin Hives  . Cephalosporins Nausea And Vomiting  . Septra [Bactrim] Nausea And Vomiting  . Ciprocinonide [Fluocinolone] Nausea And Vomiting  . Penicillins Hives   No current facility-administered medications on file prior to encounter.   Current Outpatient Medications on File Prior to Encounter  Medication Sig  Dispense Refill  . cyclobenzaprine (FLEXERIL) 10 MG tablet cyclobenzaprine 10 mg tablet  Take 1 tablet twice a day by oral route as needed.    . doxycycline (VIBRA-TABS) 100 MG tablet Take 1 tablet (100 mg total) by mouth 2 (two) times daily. 20 tablet 1  . etonogestrel (NEXPLANON) 68 MG IMPL implant 1 each by Subdermal route once.    . hydrOXYzine (ATARAX/VISTARIL) 25 MG tablet Take 25 mg by mouth 3 (three) times daily.    Marland Kitchen omeprazole (PRILOSEC) 20 MG capsule TAKE 1 CAPSULE BY MOUTH EVERY DAY AS NEEDED 30 capsule 3  . oxybutynin (DITROPAN-XL) 10 MG 24 hr tablet TAKE 1 TABLET BY MOUTH EVERY NIGHT AT BEDTIME 30 tablet 2  . phenazopyridine (PYRIDIUM) 200 MG tablet Take 1 tablet (200 mg total) by mouth 3 (three) times daily as needed for pain. 10 tablet 0  . simvastatin (ZOCOR) 20 MG tablet TAKE 1 TABLET(20 MG) BY MOUTH DAILY 30 tablet 6   Social History   Socioeconomic History  . Marital status: Single    Spouse name: Not on file  . Number of children: 3  . Years of education: Not on file  . Highest education level: 9th grade  Occupational History  . Not on file  Tobacco Use  . Smoking status: Current Every Day Smoker    Packs/day: 0.50    Years: 15.00    Pack years: 7.50    Types: Cigarettes  . Smokeless tobacco: Never Used  Vaping Use  . Vaping Use: Never used  Substance  and Sexual Activity  . Alcohol use: No  . Drug use: Not Currently    Types: Hydrocodone  . Sexual activity: Yes    Birth control/protection: Implant  Other Topics Concern  . Not on file  Social History Narrative  . Not on file   Social Determinants of Health   Financial Resource Strain: Low Risk   . Difficulty of Paying Living Expenses: Not hard at all  Food Insecurity: No Food Insecurity  . Worried About Programme researcher, broadcasting/film/video in the Last Year: Never true  . Ran Out of Food in the Last Year: Never true  Transportation Needs: No Transportation Needs  . Lack of Transportation (Medical): No  . Lack of  Transportation (Non-Medical): No  Physical Activity: Insufficiently Active  . Days of Exercise per Week: 2 days  . Minutes of Exercise per Session: 30 min  Stress: No Stress Concern Present  . Feeling of Stress : Only a little  Social Connections: Moderately Isolated  . Frequency of Communication with Friends and Family: More than three times a week  . Frequency of Social Gatherings with Friends and Family: More than three times a week  . Attends Religious Services: 1 to 4 times per year  . Active Member of Clubs or Organizations: No  . Attends Banker Meetings: Never  . Marital Status: Divorced  Catering manager Violence: Not At Risk  . Fear of Current or Ex-Partner: No  . Emotionally Abused: No  . Physically Abused: No  . Sexually Abused: No   Family History  Problem Relation Age of Onset  . Cancer Maternal Grandmother        lung  . Cancer Maternal Grandfather        unsure   . CAD Father   . Stroke Father   . Liver disease Father   . Alcohol abuse Father   . Other Mother        brain tumor  . Anxiety disorder Sister   . Asthma Son   . Bronchitis Son   . ADD / ADHD Son   . Other Daughter        acid reflux     OBJECTIVE:  Vitals:   11/08/20 1556  BP: 117/78  Pulse: 81  Resp: 16  Temp: 98.5 F (36.9 C)  SpO2: 98%    General appearance: Alert; NAD HEENT: NCAT.  Oropharynx clear.  Lungs: clear to auscultation bilaterally without adventitious breath sounds Heart: regular rate and rhythm.  Radial pulses 2+ symmetrical bilaterally Abdomen: soft, non-distended; normal active bowel sounds; right lower quadrant tenderness; nontender at McBurney's point; negative Murphy's sign; negative rebound; no guarding Back: no CVA tenderness Extremities: no edema; symmetrical with no gross deformities Skin: warm and dry Neurologic: normal gait Psychological: alert and cooperative; normal mood and affect  LABS: Results for orders placed or performed during the  hospital encounter of 11/08/20 (from the past 24 hour(s))  POCT urinalysis dipstick     Status: Abnormal   Collection Time: 11/08/20  4:19 PM  Result Value Ref Range   Color, UA yellow yellow   Clarity, UA clear clear   Glucose, UA negative negative mg/dL   Bilirubin, UA negative negative   Ketones, POC UA negative negative mg/dL   Spec Grav, UA 2.297 9.892 - 1.025   Blood, UA negative negative   pH, UA 6.0 5.0 - 8.0   Protein Ur, POC negative negative mg/dL   Urobilinogen, UA 0.2 0.2 or 1.0 E.U./dL  Nitrite, UA Negative Negative   Leukocytes, UA Trace (A) Negative  POCT urine pregnancy     Status: None   Collection Time: 11/08/20  4:19 PM  Result Value Ref Range   Preg Test, Ur Negative Negative    DIAGNOSTIC STUDIES: No results found.   ASSESSMENT & PLAN:  1. Right lower quadrant pain     Meds ordered this encounter  Medications  . nitrofurantoin, macrocrystal-monohydrate, (MACROBID) 100 MG capsule    Sig: Take 1 capsule (100 mg total) by mouth 2 (two) times daily.    Dispense:  10 capsule    Refill:  0    Discharge Instructions  Urine analysis showed trace of white blood cell, which is inconclusive for UTI. Urine sample will be sent for culture and someone will call you if your result is abnormal. Follow-up with PCP Return or go to ED if you develop any worsening of your symptoms  If you experience new or worsening symptoms return or go to ER such as fever, chills, nausea, vomiting, diarrhea, bloody or dark tarry stools, constipation, urinary symptoms, worsening abdominal discomfort, symptoms that do not improve with medications, inability to keep fluids down, etc...  Reviewed expectations re: course of current medical issues. Questions answered. Outlined signs and symptoms indicating need for more acute intervention. Patient verbalized understanding. After Visit Summary given.   Durward Parcel, FNP 11/08/20 [phone]

## 2020-11-08 NOTE — ED Triage Notes (Signed)
Pt reports with right lower quadrant pain after bending over to pick up something

## 2020-11-08 NOTE — Discharge Instructions (Signed)
Urine analysis showed trace of white blood cell, which is inconclusive for UTI. Urine sample will be sent for culture and someone will call you if your result is abnormal. Follow-up with PCP Return or go to ED if you develop any worsening of your symptoms

## 2020-11-10 ENCOUNTER — Ambulatory Visit: Payer: Medicaid Other | Admitting: Family Medicine

## 2020-11-10 ENCOUNTER — Encounter: Payer: Self-pay | Admitting: Family Medicine

## 2020-11-10 ENCOUNTER — Other Ambulatory Visit: Payer: Self-pay

## 2020-11-10 ENCOUNTER — Ambulatory Visit (INDEPENDENT_AMBULATORY_CARE_PROVIDER_SITE_OTHER): Payer: Medicaid Other

## 2020-11-10 VITALS — BP 106/80 | HR 90 | Temp 97.9°F | Resp 20 | Ht 60.0 in | Wt 140.0 lb

## 2020-11-10 DIAGNOSIS — R1031 Right lower quadrant pain: Secondary | ICD-10-CM | POA: Diagnosis not present

## 2020-11-10 DIAGNOSIS — R109 Unspecified abdominal pain: Secondary | ICD-10-CM | POA: Diagnosis not present

## 2020-11-10 LAB — URINALYSIS
Bilirubin, UA: NEGATIVE
Glucose, UA: NEGATIVE
Ketones, UA: NEGATIVE
Nitrite, UA: NEGATIVE
Protein,UA: NEGATIVE
RBC, UA: NEGATIVE
Specific Gravity, UA: 1.01 (ref 1.005–1.030)
Urobilinogen, Ur: 0.2 mg/dL (ref 0.2–1.0)
pH, UA: 6 (ref 5.0–7.5)

## 2020-11-10 LAB — URINE CULTURE: Culture: NO GROWTH

## 2020-11-10 LAB — PREGNANCY, URINE: Preg Test, Ur: NEGATIVE

## 2020-11-10 NOTE — Progress Notes (Signed)
 Subjective:  Patient ID: Rebecca [name], female    DOB: 04/22/1986  Age: 34 y.o. MRN: [id number]  CC: Follow-up   HPI Rebecca [name] presents for right-sided abdominal pain radiating from the right flank.  There is also notes also some radiation to the left flank.  Pain is sharp stabbing sensation at that abdomen it comes and goes frequently through the day since onset 3 days ago.  She was bending over in the evening after bathing her 15-month-old baby to dress the baby and she was hit with a sharp stabbing sensation then.  2 days ago she went to urgent care.  There was a trace of white blood cells in her urine so they started on Macrobid.  They also told her they thought it was in the area of her liver.  That she says has freaked her out.  She has been having diarrhea ever since starting on the Macrobid.  She is gone so much that her rectum is sore.  She does not have any nausea or vomiting.  There is no fever.  Appetite is diminished.  Depression screen PHQ 2/9 11/10/2020 06/06/2020 12/20/2019  Decreased Interest 0 0 0  Down, Depressed, Hopeless 0 0 0  PHQ - 2 Score 0 0 0  Altered sleeping - 1 0  Tired, decreased energy - 0 0  Change in appetite - 2 0  Feeling bad or failure about yourself  - 0 0  Trouble concentrating - 0 0  Moving slowly or fidgety/restless - 0 0  Suicidal thoughts - 0 0  PHQ-9 Score - 3 0  Difficult doing work/chores - Not difficult at all -    History Rebecca [name] has a past medical history of Anxiety, Back pain, Boil (05/14/2016), Breast pain (05/11/2015), Contraceptive management (08/30/2013), GERD (gastroesophageal reflux disease), Hyperlipidemia, LLQ pain (11/04/2014), OAB (overactive bladder) (04/20/2013), Reflux, Smoker (05/14/2016), Soreness breast (10/25/2013), and Urinary frequency (11/04/2014).   She has a past surgical history that includes No past surgeries.   Her family history includes ADD / ADHD in her son; Alcohol abuse in her father; Anxiety disorder in her  sister; Asthma in her son; Bronchitis in her son; CAD in her father; Cancer in her maternal grandfather and maternal grandmother; Liver disease in her father; Other in her daughter and mother; Stroke in her father.She reports that she has been smoking cigarettes. She has a 7.50 pack-year smoking history. She has never used smokeless tobacco. She reports previous drug use. Drug: Hydrocodone. She reports that she does not drink alcohol.    ROS Review of Systems  Constitutional: Negative.   HENT: Negative.   Eyes: Negative for visual disturbance.  Respiratory: Negative for shortness of breath.   Cardiovascular: Negative for chest pain.  Gastrointestinal: Positive for abdominal pain and diarrhea. Negative for blood in stool.  Musculoskeletal: Negative for arthralgias.    Objective:  BP 106/80   Pulse 90   Temp 97.9 F (36.6 C) (Temporal)   Resp 20   Ht 5' (1.524 m)   Wt 140 lb (63.5 kg)   SpO2 97%   BMI 27.34 kg/m   BP Readings from Last 3 Encounters:  11/10/20 106/80  11/08/20 117/78  10/25/20 102/63    Wt Readings from Last 3 Encounters:  11/10/20 140 lb (63.5 kg)  06/06/20 144 lb (65.3 kg)  04/25/20 147 lb (66.7 kg)     Physical Exam Constitutional:      Appearance: She is well-developed and well-nourished.  HENT:       Head: Normocephalic and atraumatic.  Cardiovascular:     Rate and Rhythm: Normal rate and regular rhythm.     Heart sounds: No murmur heard.   Pulmonary:     Effort: Pulmonary effort is normal.     Breath sounds: Normal breath sounds.  Abdominal:     General: Bowel sounds are normal.     Palpations: Abdomen is soft. There is no mass.     Tenderness: There is abdominal tenderness (Moderate at McBUrney's point. ). There is no guarding or rebound.  Skin:    General: Skin is warm and dry.  Neurological:     Mental Status: She is alert and oriented to person, place, and time.  Psychiatric:        Mood and Affect: Mood and affect normal.         Behavior: Behavior normal.       Assessment & Plan:   Rebecca [name] was seen today for follow-up.  Diagnoses and all orders for this visit:  Abdominal pain, right lower quadrant -     CBC with Differential/Platelet -     CMP14+EGFR -     DG Abd 2 Views; Future -     Urinalysis -     Pregnancy, urine  Right lower quadrant abdominal pain -     CT ABDOMEN PELVIS W CONTRAST; Future       I have discontinued Rebecca [name]'s hydrOXYzine, oxybutynin, doxycycline, and phenazopyridine. I am also having her maintain her Nexplanon, simvastatin, cyclobenzaprine, nitrofurantoin (macrocrystal-monohydrate), and omeprazole.  Allergies as of 11/10/2020      Reactions   Amoxicillin Hives   Cephalosporins Nausea And Vomiting   Septra [bactrim] Nausea And Vomiting   Ciprocinonide [fluocinolone] Nausea And Vomiting   Penicillins Hives      Medication List       Accurate as of November 10, 2020 11:59 PM. If you have any questions, ask your nurse or doctor.        STOP taking these medications   doxycycline 100 MG tablet Commonly known as: VIBRA-TABS Stopped by: Warren Stacks, MD   hydrOXYzine 25 MG tablet Commonly known as: ATARAX/VISTARIL Stopped by: Warren Stacks, MD   oxybutynin 10 MG 24 hr tablet Commonly known as: DITROPAN-XL Stopped by: Warren Stacks, MD   phenazopyridine 200 MG tablet Commonly known as: Pyridium Stopped by: Warren Stacks, MD     TAKE these medications   cyclobenzaprine 10 MG tablet Commonly known as: FLEXERIL cyclobenzaprine 10 mg tablet  Take 1 tablet twice a day by oral route as needed.   Nexplanon 68 MG Impl implant Generic drug: etonogestrel 1 each by Subdermal route once.   nitrofurantoin (macrocrystal-monohydrate) 100 MG capsule Commonly known as: MACROBID Take 1 capsule (100 mg total) by mouth 2 (two) times daily.   omeprazole 20 MG capsule Commonly known as: PRILOSEC Take 1 daily   simvastatin 20 MG tablet Commonly known as:  ZOCOR TAKE 1 TABLET(20 MG) BY MOUTH DAILY        Follow-up: Return if symptoms worsen or fail to improve.  Warren Stacks, M.D. 

## 2020-11-11 LAB — CBC WITH DIFFERENTIAL/PLATELET
Basophils Absolute: 0 10*3/uL (ref 0.0–0.2)
Basos: 0 %
EOS (ABSOLUTE): 0.2 10*3/uL (ref 0.0–0.4)
Eos: 2 %
Hematocrit: 45 % (ref 34.0–46.6)
Hemoglobin: 14.9 g/dL (ref 11.1–15.9)
Immature Grans (Abs): 0 10*3/uL (ref 0.0–0.1)
Immature Granulocytes: 0 %
Lymphocytes Absolute: 3.6 10*3/uL — ABNORMAL HIGH (ref 0.7–3.1)
Lymphs: 37 %
MCH: 29.7 pg (ref 26.6–33.0)
MCHC: 33.1 g/dL (ref 31.5–35.7)
MCV: 90 fL (ref 79–97)
Monocytes Absolute: 0.5 10*3/uL (ref 0.1–0.9)
Monocytes: 5 %
Neutrophils Absolute: 5.3 10*3/uL (ref 1.4–7.0)
Neutrophils: 56 %
Platelets: 204 10*3/uL (ref 150–450)
RBC: 5.01 x10E6/uL (ref 3.77–5.28)
RDW: 12.4 % (ref 11.7–15.4)
WBC: 9.6 10*3/uL (ref 3.4–10.8)

## 2020-11-11 LAB — CMP14+EGFR
ALT: 33 IU/L — ABNORMAL HIGH (ref 0–32)
AST: 17 IU/L (ref 0–40)
Albumin/Globulin Ratio: 1.9 (ref 1.2–2.2)
Albumin: 4.6 g/dL (ref 3.8–4.8)
Alkaline Phosphatase: 135 IU/L — ABNORMAL HIGH (ref 44–121)
BUN/Creatinine Ratio: 6 — ABNORMAL LOW (ref 9–23)
BUN: 4 mg/dL — ABNORMAL LOW (ref 6–20)
Bilirubin Total: 0.4 mg/dL (ref 0.0–1.2)
CO2: 21 mmol/L (ref 20–29)
Calcium: 9.5 mg/dL (ref 8.7–10.2)
Chloride: 106 mmol/L (ref 96–106)
Creatinine, Ser: 0.66 mg/dL (ref 0.57–1.00)
GFR calc Af Amer: 132 mL/min/{1.73_m2} (ref >59)
GFR calc non Af Amer: 115 mL/min/{1.73_m2} (ref >59)
Globulin, Total: 2.4 g/dL (ref 1.5–4.5)
Glucose: 80 mg/dL (ref 65–99)
Potassium: 4.3 mmol/L (ref 3.5–5.2)
Sodium: 144 mmol/L (ref 134–144)
Total Protein: 7 g/dL (ref 6.0–8.5)

## 2020-11-12 NOTE — Progress Notes (Signed)
Hello Mava,  Your lab result is normal and/or stable.Some minor variations that are not significant are commonly marked abnormal, but do not represent any medical problem for you.  Best regards, Decker Cogdell, M.D.

## 2020-11-14 ENCOUNTER — Encounter: Payer: Self-pay | Admitting: Family Medicine

## 2020-11-20 ENCOUNTER — Other Ambulatory Visit: Payer: Self-pay | Admitting: Family Medicine

## 2020-11-20 DIAGNOSIS — R1031 Right lower quadrant pain: Secondary | ICD-10-CM

## 2020-11-20 DIAGNOSIS — R109 Unspecified abdominal pain: Secondary | ICD-10-CM

## 2020-12-12 ENCOUNTER — Ambulatory Visit (HOSPITAL_COMMUNITY): Payer: Medicaid Other

## 2020-12-13 ENCOUNTER — Other Ambulatory Visit: Payer: Self-pay | Admitting: Adult Health

## 2020-12-23 ENCOUNTER — Ambulatory Visit
Admission: EM | Admit: 2020-12-23 | Discharge: 2020-12-23 | Disposition: A | Discharge status: Home / Self Care | Payer: Medicaid Other | Attending: Family Medicine | Admitting: Family Medicine

## 2020-12-23 ENCOUNTER — Encounter: Payer: Self-pay | Admitting: Emergency Medicine

## 2020-12-23 DIAGNOSIS — H1032 Unspecified acute conjunctivitis, left eye: Secondary | ICD-10-CM | POA: Diagnosis not present

## 2020-12-23 MED ORDER — TOBRAMYCIN 0.3 % OP SOLN: 1.0000 [drp] | OPHTHALMIC | 0 refills | Status: DC

## 2020-12-23 NOTE — Discharge Instructions (Addendum)
I have sent in Tobradex drops for you to use 1-2 drops every 4 hours in the affected eye until symptoms resolve  Follow up with this office or with primary care if symptoms are persisting.  Follow up in the ER for high fever, trouble swallowing, trouble breathing, other concerning symptoms.

## 2020-12-23 NOTE — ED Triage Notes (Signed)
Left eye red and draining x 4 days

## 2020-12-23 NOTE — ED Provider Notes (Signed)
[facility]   [id number] 12/23/20 Arrival Time: 0927  CC: EYE REDNESS  SUBJECTIVE:  Rebecca [name] is a 35 y.o. female who presents with complaint of L eye redness, draining, and swelling that began 4 days ago. Denies a precipitating event, trauma, or close contacts with similar symptoms. Has tried artificial tears with little relief. Denies similar symptoms in the past. Denies fever, chills, nausea, vomiting, eye pain, painful eye movements, halos, itching, vision changes, double vision, FB sensation, periorbital erythema.   Denies contact lens use.    ROS: As per HPI.  All other pertinent ROS negative.     Past Medical History:  Diagnosis Date  . Anxiety   . Back pain   . Boil 05/14/2016  . Breast pain 05/11/2015  . Contraceptive management 08/30/2013  . GERD (gastroesophageal reflux disease)   . Hyperlipidemia   . LLQ pain 11/04/2014  . OAB (overactive bladder) 04/20/2013  . Reflux   . Smoker 05/14/2016  . Soreness breast 10/25/2013   Left breast sore UOQ, no masses  . Urinary frequency 11/04/2014   Past Surgical History:  Procedure Laterality Date  . NO PAST SURGERIES     Allergies  Allergen Reactions  . Amoxicillin Hives  . Cephalosporins Nausea And Vomiting  . Septra [Bactrim] Nausea And Vomiting  . Ciprocinonide [Fluocinolone] Nausea And Vomiting  . Penicillins Hives   No current facility-administered medications on file prior to encounter.   Current Outpatient Medications on File Prior to Encounter  Medication Sig Dispense Refill  . cyclobenzaprine (FLEXERIL) 10 MG tablet cyclobenzaprine 10 mg tablet  Take 1 tablet twice a day by oral route as needed.    . doxycycline (VIBRA-TABS) 100 MG tablet TAKE 1 TABLET(100 MG) BY MOUTH TWICE DAILY 20 tablet 1  . etonogestrel (NEXPLANON) 68 MG IMPL implant 1 each by Subdermal route once.    . nitrofurantoin, macrocrystal-monohydrate, (MACROBID) 100 MG capsule Take 1 capsule (100 mg total) by mouth 2 (two) times  daily. 10 capsule 0  . omeprazole (PRILOSEC) 20 MG capsule Take 1 daily 30 capsule 3  . simvastatin (ZOCOR) 20 MG tablet TAKE 1 TABLET(20 MG) BY MOUTH DAILY 30 tablet 6   Social History   Socioeconomic History  . Marital status: Single    Spouse name: Not on file  . Number of children: 3  . Years of education: Not on file  . Highest education level: 9th grade  Occupational History  . Not on file  Tobacco Use  . Smoking status: Current Every Day Smoker    Packs/day: 0.50    Years: 15.00    Pack years: 7.50    Types: Cigarettes  . Smokeless tobacco: Never Used  Vaping Use  . Vaping Use: Never used  Substance and Sexual Activity  . Alcohol use: No  . Drug use: Not Currently    Types: Hydrocodone  . Sexual activity: Yes    Birth control/protection: Implant  Other Topics Concern  . Not on file  Social History Narrative  . Not on file   Social Determinants of Health   Financial Resource Strain: Low Risk   . Difficulty of Paying Living Expenses: Not hard at all  Food Insecurity: No Food Insecurity  . Worried About Programme researcher, broadcasting/film/video in the Last Year: Never true  . Ran Out of Food in the Last Year: Never true  Transportation Needs: No Transportation Needs  . Lack of Transportation (Medical): No  . Lack of Transportation (Non-Medical): No  Physical  Activity: Insufficiently Active  . Days of Exercise per Week: 2 days  . Minutes of Exercise per Session: 30 min  Stress: No Stress Concern Present  . Feeling of Stress : Only a little  Social Connections: Moderately Isolated  . Frequency of Communication with Friends and Family: More than three times a week  . Frequency of Social Gatherings with Friends and Family: More than three times a week  . Attends Religious Services: 1 to 4 times per year  . Active Member of Clubs or Organizations: No  . Attends Banker Meetings: Never  . Marital Status: Divorced  Catering manager Violence: Not At Risk  . Fear of Current  or Ex-Partner: No  . Emotionally Abused: No  . Physically Abused: No  . Sexually Abused: No   Family History  Problem Relation Age of Onset  . Cancer Maternal Grandmother        lung  . Cancer Maternal Grandfather        unsure   . CAD Father   . Stroke Father   . Liver disease Father   . Alcohol abuse Father   . Other Mother        brain tumor  . Anxiety disorder Sister   . Asthma Son   . Bronchitis Son   . ADD / ADHD Son   . Other Daughter        acid reflux    OBJECTIVE:        Vitals:   12/23/20 0932 12/23/20 0933  BP: 109/76   Pulse: 85   Resp: 18   Temp: 97.9 F (36.6 C)   TempSrc: Oral   SpO2: 99%   Weight:  140 lb (63.5 kg)  Height:  5' (1.524 m)    General appearance: alert; no distress Eyes: L conjunctival erythemaPERRL; EOMI without discomfort;  no obvious drainage; lid everted without obvious FB; no obvious fluorescein uptake  Neck: supple Lungs: clear to auscultation bilaterally Heart: regular rate and rhythm Skin: warm and dry Psychological: alert and cooperative; normal mood and affect   ASSESSMENT & PLAN:  1. Acute bacterial conjunctivitis of left eye     Meds ordered this encounter  Medications  . tobramycin (TOBREX) 0.3 % ophthalmic solution    Sig: Place 1 drop into the left eye every 4 (four) hours.    Dispense:  5 mL    Refill:  0    Order Specific Question:   Supervising Provider    Answer:   Merrilee Jansky [[id number]]     Conjunctivitis Use eye drops as prescribed and to completion Dispose of old contacts and wear glasses until you have finished course of antibiotic eye drops Wash pillow cases, wash hands regularly with soap and water, avoid touching your face and eyes, wash door handles, light switches, remotes and other objects you frequently touch Return or follow up with PCP if symptoms persists such as fever, chills, redness, swelling, eye pain, painful eye movements, vision changes   Reviewed expectations re:  course of current medical issues. Questions answered. Outlined signs and symptoms indicating need for more acute intervention. Patient verbalized understanding. After Visit Summary given.   Moshe Cipro, NP 12/23/20 1008

## 2021-01-05 ENCOUNTER — Encounter: Payer: Self-pay | Admitting: Nurse Practitioner

## 2021-01-29 ENCOUNTER — Other Ambulatory Visit: Payer: Self-pay | Admitting: Adult Health

## 2021-01-29 DIAGNOSIS — E7800 Pure hypercholesterolemia, unspecified: Secondary | ICD-10-CM

## 2021-01-29 MED ORDER — SIMVASTATIN 20 MG PO TABS: ORAL_TABLET | ORAL | 6 refills | Status: DC

## 2021-01-29 NOTE — Progress Notes (Signed)
Ck CMP and lipids

## 2021-01-29 NOTE — Progress Notes (Signed)
Refill zocor ?

## 2021-02-12 ENCOUNTER — Ambulatory Visit: Payer: Medicaid Other | Admitting: Nurse Practitioner

## 2021-02-12 ENCOUNTER — Encounter: Payer: Self-pay | Admitting: Nurse Practitioner

## 2021-02-12 DIAGNOSIS — J0110 Acute frontal sinusitis, unspecified: Secondary | ICD-10-CM | POA: Insufficient documentation

## 2021-02-12 MED ORDER — AZITHROMYCIN 250 MG PO TABS: ORAL_TABLET | ORAL | 0 refills | Status: DC

## 2021-02-12 MED ORDER — DM-GUAIFENESIN ER 30-600 MG PO TB12: 1.0000 | ORAL_TABLET | Freq: Two times a day (BID) | ORAL | 0 refills | Status: DC

## 2021-02-12 NOTE — Patient Instructions (Signed)

## 2021-02-13 NOTE — Progress Notes (Signed)
   Virtual Visit via telephone Note Due to COVID-19 pandemic this visit was conducted virtually. This visit type was conducted due to national recommendations for restrictions regarding the COVID-19 Pandemic (e.g. social distancing, sheltering in place) in an effort to limit this patient's exposure and mitigate transmission in our community. All issues noted in this document were discussed and addressed.  A physical exam was not performed with this format.  I connected with Rebecca [name] on 02/13/21 at  12:45 PM by telephone and verified that I am speaking with the correct person using two identifiers. Rebecca [name] is currently located at home during visit. The provider, Daryll Drown, NP is located in their office at time of visit.  I discussed the limitations, risks, security and privacy concerns of performing an evaluation and management service by telephone and the availability of in person appointments. I also discussed with the patient that there may be a patient responsible charge related to this service. The patient expressed understanding and agreed to proceed.   History and Present Illness:  Sinusitis This is a recurrent problem. The current episode started in the past 7 days. There has been no fever. The pain is moderate. Associated symptoms include chills, congestion and coughing. Pertinent negatives include no headaches, shortness of breath, sore throat or swollen glands. Past treatments include nothing. The treatment provided mild relief.      Review of Systems  Constitutional: Positive for chills. Negative for fever.  HENT: Positive for congestion. Negative for sore throat.   Respiratory: Positive for cough. Negative for shortness of breath.   Neurological: Negative for headaches.  All other systems reviewed and are negative.    Observations/Objective: Televisit-patient does not sound to be in distress.  Assessment and Plan: Subacute frontal sinusitis Symptoms  are subacute frontal sinusitis not well controlled.  Guaifenesin for cough and congestion, azithromycin started.  Education provided to patient over the phone.  Rx sent to pharmacy.  Patient is to follow-up with worsening or unresolved symptoms.   Follow Up Instructions: Follow-up with worsening symptoms.    I discussed the assessment and treatment plan with the patient. The patient was provided an opportunity to ask questions and all were answered. The patient agreed with the plan and demonstrated an understanding of the instructions.   The patient was advised to call back or seek an in-person evaluation if the symptoms worsen or if the condition fails to improve as anticipated.  The above assessment and management plan was discussed with the patient. The patient verbalized understanding of and has agreed to the management plan. Patient is aware to call the clinic if symptoms persist or worsen. Patient is aware when to return to the clinic for a follow-up visit. Patient educated on when it is appropriate to go to the emergency department.   Time call ended: 12:54 PM  I provided 9 minutes of non-face-to-face time during this encounter.    Daryll Drown, NP

## 2021-02-13 NOTE — Assessment & Plan Note (Signed)
Symptoms are subacute frontal sinusitis not well controlled.  Guaifenesin for cough and congestion, azithromycin started.  Education provided to patient over the phone.  Rx sent to pharmacy.  Patient is to follow-up with worsening or unresolved symptoms.

## 2021-03-23 ENCOUNTER — Other Ambulatory Visit: Payer: Self-pay | Admitting: Adult Health

## 2021-03-29 ENCOUNTER — Other Ambulatory Visit: Payer: Self-pay

## 2021-03-29 ENCOUNTER — Encounter (HOSPITAL_COMMUNITY): Payer: Self-pay

## 2021-03-29 ENCOUNTER — Emergency Department (HOSPITAL_COMMUNITY)
Admission: EM | Admit: 2021-03-29 | Discharge: 2021-03-30 | Disposition: A | Discharge status: Home / Self Care | Payer: Medicaid Other | Attending: Emergency Medicine | Admitting: Emergency Medicine

## 2021-03-29 DIAGNOSIS — F17210 Nicotine dependence, cigarettes, uncomplicated: Secondary | ICD-10-CM | POA: Diagnosis not present

## 2021-03-29 DIAGNOSIS — M545 Low back pain, unspecified: Secondary | ICD-10-CM | POA: Diagnosis present

## 2021-03-29 DIAGNOSIS — M5459 Other low back pain: Secondary | ICD-10-CM | POA: Diagnosis not present

## 2021-03-29 LAB — URINALYSIS, ROUTINE W REFLEX MICROSCOPIC
Bilirubin Urine: NEGATIVE
Glucose, UA: NEGATIVE mg/dL
Hgb urine dipstick: NEGATIVE
Ketones, ur: NEGATIVE mg/dL
Nitrite: NEGATIVE
Protein, ur: NEGATIVE mg/dL
Specific Gravity, Urine: 1.008 (ref 1.005–1.030)
pH: 6 (ref 5.0–8.0)

## 2021-03-29 MED ORDER — CIPROFLOXACIN HCL 500 MG PO TABS: 500.0000 mg | ORAL_TABLET | Freq: Two times a day (BID) | ORAL | 0 refills | Status: DC

## 2021-03-29 MED ORDER — KETOROLAC TROMETHAMINE 60 MG/2ML IM SOLN
60.0000 mg | Freq: Once | INTRAMUSCULAR | Status: AC
  Administered 2021-03-30: 60 mg via INTRAMUSCULAR
  Filled 2021-03-29: qty 2

## 2021-03-29 MED ORDER — CIPROFLOXACIN HCL 250 MG PO TABS
500.0000 mg | ORAL_TABLET | Freq: Once | ORAL | Status: AC
  Administered 2021-03-30: 500 mg via ORAL
  Filled 2021-03-29: qty 2

## 2021-03-29 MED ORDER — TRAMADOL HCL 50 MG PO TABS
50.0000 mg | ORAL_TABLET | Freq: Once | ORAL | Status: AC
  Administered 2021-03-30: 50 mg via ORAL
  Filled 2021-03-29: qty 1

## 2021-03-29 MED ORDER — TRAMADOL HCL 50 MG PO TABS: 50.0000 mg | ORAL_TABLET | Freq: Four times a day (QID) | ORAL | 0 refills | Status: DC | PRN

## 2021-03-29 MED ORDER — NAPROXEN 500 MG PO TABS: 500.0000 mg | ORAL_TABLET | Freq: Two times a day (BID) | ORAL | 0 refills | Status: DC

## 2021-03-29 NOTE — ED Provider Notes (Incomplete)
[facility] EMERGENCY DEPARTMENT Provider Note   CSN: [id number] Arrival date & time: 03/29/21  2152     History Chief Complaint  Patient presents with  . Back Pain    Rebecca [name] is a 35 y.o. female.   Back Pain      Past Medical History:  Diagnosis Date  . Anxiety   . Back pain   . Boil 05/14/2016  . Breast pain 05/11/2015  . Contraceptive management 08/30/2013  . GERD (gastroesophageal reflux disease)   . Hyperlipidemia   . LLQ pain 11/04/2014  . OAB (overactive bladder) 04/20/2013  . Reflux   . Smoker 05/14/2016  . Soreness breast 10/25/2013   Left breast sore UOQ, no masses  . Urinary frequency 11/04/2014    Patient Active Problem List   Diagnosis Date Noted  . Subacute frontal sinusitis 02/12/2021  . Nexplanon in place 06/06/2020  . Encounter for gynecological examination with Papanicolaou smear of cervix 06/06/2020  . Elevated liver enzymes 06/06/2020  . OAB (overactive bladder) 04/25/2020  . Dyspareunia in female 04/25/2020  . Boil 04/25/2020  . Nexplanon insertion 02/25/2020  . Pregnancy examination or test, negative result 02/25/2020  . Bloating 02/17/2020  . Encounter for initial prescription of implantable subdermal contraceptive 02/17/2020  . Chronic high back pain 12/20/2019  . Chronic mid back pain 12/20/2019  . Encounter for surveillance of injectable contraceptive 12/08/2019  . Dyslipidemia (high LDL; low HDL) 09/17/2019  . Smoker 05/14/2016  . Depression with anxiety 04/05/2013    Past Surgical History:  Procedure Laterality Date  . NO PAST SURGERIES       OB History    Gravida  3   Para  3   Term  2   Preterm  1   AB      Living  3     SAB      IAB      Ectopic      Multiple      Live Births  3           Family History  Problem Relation Age of Onset  . Cancer Maternal Grandmother        lung  . Cancer Maternal Grandfather        unsure   . CAD Father   . Stroke Father   . Liver disease Father   .  Alcohol abuse Father   . Other Mother        brain tumor  . Anxiety disorder Sister   . Asthma Son   . Bronchitis Son   . ADD / ADHD Son   . Other Daughter        acid reflux    Social History   Tobacco Use  . Smoking status: Current Every Day Smoker    Packs/day: 0.50    Years: 15.00    Pack years: 7.50    Types: Cigarettes  . Smokeless tobacco: Never Used  Vaping Use  . Vaping Use: Never used  Substance Use Topics  . Alcohol use: No  . Drug use: Never    Types: Hydrocodone    Home Medications Prior to Admission medications   Medication Sig Start Date End Date Taking? Authorizing Provider  azithromycin (ZITHROMAX) 250 MG tablet 2 tablet day 1, 1 tablet day 2-5. 02/12/21   Daryll Drown, NP  cyclobenzaprine (FLEXERIL) 10 MG tablet cyclobenzaprine 10 mg tablet  Take 1 tablet twice a day by oral route as needed.    [provider]  dextromethorphan-guaiFENesin (MUCINEX DM) 30-600 MG 12hr tablet Take 1 tablet by mouth 2 (two) times daily. 02/12/21   Daryll Drown, NP  etonogestrel (NEXPLANON) 68 MG IMPL implant 1 each by Subdermal route once.    [provider]  omeprazole (PRILOSEC) 20 MG capsule TAKE 1 CAPSULE BY MOUTH DAILY 03/26/21   Cyril Mourning A, NP  simvastatin (ZOCOR) 20 MG tablet TAKE 1 TABLET(20 MG) BY MOUTH DAILY 01/29/21   Cyril Mourning A, NP  tobramycin (TOBREX) 0.3 % ophthalmic solution Place 1 drop into the left eye every 4 (four) hours. 12/23/20   Moshe Cipro, NP    Allergies    Amoxicillin, Cephalosporins, Septra [bactrim], Ciprocinonide [fluocinolone], and Penicillins  Review of Systems   Review of Systems  Musculoskeletal: Positive for back pain.    Physical Exam Updated Vital Signs BP 127/81 (BP Location: Right Arm)   Pulse 92   Temp 98.6 F (37 C) (Oral)   Resp 18   Ht 5\' 2"  (1.575 m)   Wt 61.2 kg   SpO2 100%   BMI 24.69 kg/m   Physical Exam  ED Results / Procedures / Treatments   Labs (all labs  ordered are listed, but only abnormal results are displayed) Labs Reviewed  URINALYSIS, ROUTINE W REFLEX MICROSCOPIC - Abnormal; Notable for the following components:      Result Value   APPearance HAZY (*)    Leukocytes,Ua MODERATE (*)    Bacteria, UA RARE (*)    All other components within normal limits  PREGNANCY, URINE    EKG None  Radiology No results found.  Procedures Procedures {Remember to document critical care time when appropriate:1}  Medications Ordered in ED Medications - No data to display  ED Course  I have reviewed the triage vital signs and the nursing notes.  Pertinent labs & imaging results that were available during my care of the patient were reviewed by me and considered in my medical decision making (see chart for details).    MDM Rules/Calculators/A&P                          *** Final Clinical Impression(s) / ED Diagnoses Final diagnoses:  None    Rx / DC Orders ED Discharge Orders    None

## 2021-03-29 NOTE — Discharge Instructions (Addendum)
Begin taking naproxen as prescribed.  Begin taking tramadol as prescribed as needed for pain not relieved with naproxen.  Begin taking Cipro as prescribed.  Follow-up with primary doctor if symptoms or not improving in the next week, and return to the ER if symptoms significantly worsen or change.

## 2021-03-29 NOTE — ED Triage Notes (Signed)
Pt ambulatory to er room number 9, pt states that she is here for back pain radiating down into her legs, states that she also feels like "something is going to fall out of my but"  Pt states that she is also having some urinary frequency.

## 2021-03-29 NOTE — ED Provider Notes (Signed)
[facility] EMERGENCY DEPARTMENT Provider Note   CSN: [id number] Arrival date & time: 03/29/21  2152     History Chief Complaint  Patient presents with  . Back Pain    Rebecca [name] is a 35 y.o. female.  Patient is a 35 year old female with history of hyperlipidemia, overactive bladder.  Patient presents today for evaluation of back pain.  She describes a pain in the left lower back that began yesterday.  Pain seemed to improve through the night, then recurred this morning.  She describes significant discomfort to the left lower back with radiation into her legs, but no weakness or numbness.  She denies any bowel or bladder complaints.  Pain is worse with movement and change in position.  Symptoms improve somewhat with rest and remaining still.  She denies any specific injury or trauma.  The history is provided by the patient.       Past Medical History:  Diagnosis Date  . Anxiety   . Back pain   . Boil 05/14/2016  . Breast pain 05/11/2015  . Contraceptive management 08/30/2013  . GERD (gastroesophageal reflux disease)   . Hyperlipidemia   . LLQ pain 11/04/2014  . OAB (overactive bladder) 04/20/2013  . Reflux   . Smoker 05/14/2016  . Soreness breast 10/25/2013   Left breast sore UOQ, no masses  . Urinary frequency 11/04/2014    Patient Active Problem List   Diagnosis Date Noted  . Subacute frontal sinusitis 02/12/2021  . Nexplanon in place 06/06/2020  . Encounter for gynecological examination with Papanicolaou smear of cervix 06/06/2020  . Elevated liver enzymes 06/06/2020  . OAB (overactive bladder) 04/25/2020  . Dyspareunia in female 04/25/2020  . Boil 04/25/2020  . Nexplanon insertion 02/25/2020  . Pregnancy examination or test, negative result 02/25/2020  . Bloating 02/17/2020  . Encounter for initial prescription of implantable subdermal contraceptive 02/17/2020  . Chronic high back pain 12/20/2019  . Chronic mid back pain 12/20/2019  . Encounter for  surveillance of injectable contraceptive 12/08/2019  . Dyslipidemia (high LDL; low HDL) 09/17/2019  . Smoker 05/14/2016  . Depression with anxiety 04/05/2013    Past Surgical History:  Procedure Laterality Date  . NO PAST SURGERIES       OB History    Gravida  3   Para  3   Term  2   Preterm  1   AB      Living  3     SAB      IAB      Ectopic      Multiple      Live Births  3           Family History  Problem Relation Age of Onset  . Cancer Maternal Grandmother        lung  . Cancer Maternal Grandfather        unsure   . CAD Father   . Stroke Father   . Liver disease Father   . Alcohol abuse Father   . Other Mother        brain tumor  . Anxiety disorder Sister   . Asthma Son   . Bronchitis Son   . ADD / ADHD Son   . Other Daughter        acid reflux    Social History   Tobacco Use  . Smoking status: Current Every Day Smoker    Packs/day: 0.50    Years: 15.00    Pack years: 7.50  Types: Cigarettes  . Smokeless tobacco: Never Used  Vaping Use  . Vaping Use: Never used  Substance Use Topics  . Alcohol use: No  . Drug use: Never    Types: Hydrocodone    Home Medications Prior to Admission medications   Medication Sig Start Date End Date Taking? Authorizing Provider  azithromycin (ZITHROMAX) 250 MG tablet 2 tablet day 1, 1 tablet day 2-5. 02/12/21   Daryll Drown, NP  cyclobenzaprine (FLEXERIL) 10 MG tablet cyclobenzaprine 10 mg tablet  Take 1 tablet twice a day by oral route as needed.    [provider]  dextromethorphan-guaiFENesin (MUCINEX DM) 30-600 MG 12hr tablet Take 1 tablet by mouth 2 (two) times daily. 02/12/21   Daryll Drown, NP  etonogestrel (NEXPLANON) 68 MG IMPL implant 1 each by Subdermal route once.    [provider]  omeprazole (PRILOSEC) 20 MG capsule TAKE 1 CAPSULE BY MOUTH DAILY 03/26/21   Cyril Mourning A, NP  simvastatin (ZOCOR) 20 MG tablet TAKE 1 TABLET(20 MG) BY MOUTH DAILY 01/29/21    Cyril Mourning A, NP  tobramycin (TOBREX) 0.3 % ophthalmic solution Place 1 drop into the left eye every 4 (four) hours. 12/23/20   Moshe Cipro, NP    Allergies    Amoxicillin, Cephalosporins, Septra [bactrim], Ciprocinonide [fluocinolone], and Penicillins  Review of Systems   Review of Systems  All other systems reviewed and are negative.   Physical Exam Updated Vital Signs BP 127/81 (BP Location: Right Arm)   Pulse 92   Temp 98.6 F (37 C) (Oral)   Resp 18   Ht 5\' 2"  (1.575 m)   Wt 61.2 kg   SpO2 100%   BMI 24.69 kg/m   Physical Exam Vitals and nursing note reviewed.  Constitutional:      General: She is not in acute distress.    Appearance: She is well-developed. She is not diaphoretic.  HENT:     Head: Normocephalic and atraumatic.  Cardiovascular:     Rate and Rhythm: Normal rate and regular rhythm.     Heart sounds: No murmur heard. No friction rub. No gallop.   Pulmonary:     Effort: Pulmonary effort is normal. No respiratory distress.     Breath sounds: Normal breath sounds. No wheezing.  Abdominal:     General: Bowel sounds are normal. There is no distension.     Palpations: Abdomen is soft.     Tenderness: There is no abdominal tenderness.  Musculoskeletal:        General: Normal range of motion.     Cervical back: Normal range of motion and neck supple.     Comments: There is tenderness to palpation in the soft tissues of the left lower lumbar region.  There is no obvious abnormality, visible or palpable.  Skin:    General: Skin is warm and dry.  Neurological:     Mental Status: She is alert and oriented to person, place, and time.     Comments: Strength is 5 out of 5 in both lower extremities.  DTRs are 2+ and symmetrical in the patellar and Achilles tendons bilaterally.  She is able to ambulate on her heels and toes without difficulty.     ED Results / Procedures / Treatments   Labs (all labs ordered are listed, but only abnormal results  are displayed) Labs Reviewed  URINALYSIS, ROUTINE W REFLEX MICROSCOPIC - Abnormal; Notable for the following components:      Result Value  APPearance HAZY (*)    Leukocytes,Ua MODERATE (*)    Bacteria, UA RARE (*)    All other components within normal limits  PREGNANCY, URINE    EKG None  Radiology No results found.  Procedures Procedures   Medications Ordered in ED Medications  ciprofloxacin (CIPRO) tablet 500 mg (has no administration in time range)  ketorolac (TORADOL) injection 60 mg (has no administration in time range)  traMADol (ULTRAM) tablet 50 mg (has no administration in time range)    ED Course  I have reviewed the triage vital signs and the nursing notes.  Pertinent labs & imaging results that were available during my care of the patient were reviewed by me and considered in my medical decision making (see chart for details).    MDM Rules/Calculators/A&P  Patient presenting here with complaints of left lower back pain that seems musculoskeletal in nature.  Her abdomen is very benign and she is not complaining of any GI symptoms.  There are no red flags that would suggest an emergent situation.  Her reflexes are symmetrical and strength is normal.  Patient to be discharged with NSAIDs, tramadol, and I will also treat for what appears to be signs of a urinary tract infection.  She will be given Cipro.  To follow-up with primary doctor if not improving and return if she worsens.  Final Clinical Impression(s) / ED Diagnoses Final diagnoses:  None    Rx / DC Orders ED Discharge Orders    None       Geoffery Lyons, MD 03/29/21 2336

## 2021-03-30 LAB — POC URINE PREG, ED: Preg Test, Ur: NEGATIVE

## 2021-04-02 ENCOUNTER — Telehealth: Payer: Self-pay

## 2021-04-02 NOTE — Telephone Encounter (Signed)
Transition Care Management Unsuccessful Follow-up Telephone Call  Date of discharge and from where:  03/30/2021 from [place]  Attempts:  1st Attempt  Reason for unsuccessful TCM follow-up call:  Left voice message     

## 2021-04-03 NOTE — Telephone Encounter (Signed)
Transition Care Management Follow-up Telephone Call  Date of discharge and from where: 03/29/2021 from [facility]  How have you been since you were released from the hospital? Pt states that she is doing well today and that they medication rx'ed has really helped her symptoms.   Any questions or concerns? No  Items Reviewed:  Did the pt receive and understand the discharge instructions provided? Yes   Medications obtained and verified? Yes   Other? No   Any new allergies since your discharge? No   Dietary orders reviewed? n/a  Do you have support at home? Yes   Functional Questionnaire: (I = Independent and D = Dependent) ADLs: I  Bathing/Dressing- I  Meal Prep- I  Eating- I  Maintaining continence- I  Transferring/Ambulation- I  Managing Meds- I  Follow up appointments reviewed:   PCP Hospital f/u appt confirmed? No    Specialist Hospital f/u appt confirmed? No    Are transportation arrangements needed? No   If their condition worsens, is the pt aware to call PCP or go to the Emergency Dept.? Yes  Was the patient provided with contact information for the PCP's office or ED? Yes  Was to pt encouraged to call back with questions or concerns? Yes

## 2021-04-19 DIAGNOSIS — E7800 Pure hypercholesterolemia, unspecified: Secondary | ICD-10-CM | POA: Diagnosis not present

## 2021-04-20 LAB — COMPREHENSIVE METABOLIC PANEL
ALT: 26 IU/L (ref 0–32)
AST: 16 IU/L (ref 0–40)
Albumin/Globulin Ratio: 2 (ref 1.2–2.2)
Albumin: 4.5 g/dL (ref 3.8–4.8)
Alkaline Phosphatase: 130 IU/L — ABNORMAL HIGH (ref 44–121)
BUN/Creatinine Ratio: 7 — ABNORMAL LOW (ref 9–23)
BUN: 6 mg/dL (ref 6–20)
Bilirubin Total: 0.2 mg/dL (ref 0.0–1.2)
CO2: 18 mmol/L — ABNORMAL LOW (ref 20–29)
Calcium: 9.5 mg/dL (ref 8.7–10.2)
Chloride: 106 mmol/L (ref 96–106)
Creatinine, Ser: 0.82 mg/dL (ref 0.57–1.00)
Globulin, Total: 2.3 g/dL (ref 1.5–4.5)
Glucose: 66 mg/dL (ref 65–99)
Potassium: 4 mmol/L (ref 3.5–5.2)
Sodium: 143 mmol/L (ref 134–144)
Total Protein: 6.8 g/dL (ref 6.0–8.5)
eGFR: 96 mL/min/{1.73_m2} (ref >59)

## 2021-04-20 LAB — LIPID PANEL
Chol/HDL Ratio: 5.1 ratio — ABNORMAL HIGH (ref 0.0–4.4)
Cholesterol, Total: 152 mg/dL (ref 100–199)
HDL: 30 mg/dL — ABNORMAL LOW (ref >39)
LDL Chol Calc (NIH): 101 mg/dL — ABNORMAL HIGH (ref 0–99)
Triglycerides: 111 mg/dL (ref 0–149)
VLDL Cholesterol Cal: 21 mg/dL (ref 5–40)

## 2021-05-01 ENCOUNTER — Other Ambulatory Visit: Payer: Self-pay | Admitting: Adult Health

## 2021-05-01 MED ORDER — OMEPRAZOLE 20 MG PO CPDR: DELAYED_RELEASE_CAPSULE | ORAL | 3 refills | Status: DC

## 2021-05-01 NOTE — Progress Notes (Signed)
Refill prilosec

## 2021-06-12 DIAGNOSIS — T6391XA Toxic effect of contact with unspecified venomous animal, accidental (unintentional), initial encounter: Secondary | ICD-10-CM | POA: Diagnosis not present

## 2021-06-12 DIAGNOSIS — T1490XA Injury, unspecified, initial encounter: Secondary | ICD-10-CM | POA: Diagnosis not present

## 2021-06-14 ENCOUNTER — Ambulatory Visit
Admission: EM | Admit: 2021-06-14 | Discharge: 2021-06-14 | Disposition: A | Discharge status: Home / Self Care | Payer: Medicaid Other | Attending: Emergency Medicine | Admitting: Emergency Medicine

## 2021-06-14 ENCOUNTER — Encounter: Payer: Self-pay | Admitting: Emergency Medicine

## 2021-06-14 ENCOUNTER — Other Ambulatory Visit: Payer: Self-pay

## 2021-06-14 DIAGNOSIS — Z20822 Contact with and (suspected) exposure to covid-19: Secondary | ICD-10-CM | POA: Diagnosis not present

## 2021-06-14 DIAGNOSIS — R6889 Other general symptoms and signs: Secondary | ICD-10-CM

## 2021-06-14 MED ORDER — ACETAMINOPHEN 325 MG PO TABS
650.0000 mg | ORAL_TABLET | Freq: Once | ORAL | Status: AC
  Administered 2021-06-14: 650 mg via ORAL

## 2021-06-14 NOTE — Discharge Instructions (Signed)
COVID testing ordered.  It will take between 5-7 days for test results.  Someone will contact you regarding abnormal results.    In the meantime: You should remain isolated in your home for 5 days from symptom onset AND greater than 72 hours after symptoms resolution (absence of fever without the use of fever-reducing medication and improvement in respiratory symptoms), whichever is longer Get plenty of rest and push fluids Use OTC zyrtec for nasal congestion, runny nose, and/or sore throat Use OTC flonase for nasal congestion and runny nose Use medications daily for symptom relief Use OTC medications like ibuprofen or tylenol as needed fever or pain Call or go to the ED if you have any new or worsening symptoms such as fever, cough, shortness of breath, chest tightness, chest pain, turning blue, changes in mental status, etc...  

## 2021-06-14 NOTE — ED Triage Notes (Signed)
Pt here with generalized body aches, chills and left ear pain since this morning.

## 2021-06-14 NOTE — ED Provider Notes (Signed)
[facility]ARE [facility]   [id number] 06/14/21 Arrival Time: 1920   CC: COVID symptoms  SUBJECTIVE: History from: patient.  Rebecca [name] is a 35 y.o. female who presents with ear pain, generalized body aches, chills that began this morning.  Admits to sick exposure, unknown covid exposure.  Has NOT tried OTC medications without relief.  Denies aggravating factors.  Denies previous symptoms in the past.   Denies SOB, wheezing, chest pain, nausea, changes in bowel or bladder habits.    ROS: As per HPI.  All other pertinent ROS negative.     Past Medical History:  Diagnosis Date   Anxiety    Back pain    Boil 05/14/2016   Breast pain 05/11/2015   Contraceptive management 08/30/2013   GERD (gastroesophageal reflux disease)    Hyperlipidemia    LLQ pain 11/04/2014   OAB (overactive bladder) 04/20/2013   Reflux    Smoker 05/14/2016   Soreness breast 10/25/2013   Left breast sore UOQ, no masses   Urinary frequency 11/04/2014   Past Surgical History:  Procedure Laterality Date   NO PAST SURGERIES     Allergies  Allergen Reactions   Amoxicillin Hives   Cephalosporins Nausea And Vomiting   Septra [Bactrim] Nausea And Vomiting   Ciprocinonide [Fluocinolone] Nausea And Vomiting   Penicillins Hives   No current facility-administered medications on file prior to encounter.   Current Outpatient Medications on File Prior to Encounter  Medication Sig Dispense Refill   azithromycin (ZITHROMAX) 250 MG tablet 2 tablet day 1, 1 tablet day 2-5. 1 each 0   ciprofloxacin (CIPRO) 500 MG tablet Take 1 tablet (500 mg total) by mouth 2 (two) times daily. One po bid x 7 days 10 tablet 0   cyclobenzaprine (FLEXERIL) 10 MG tablet cyclobenzaprine 10 mg tablet  Take 1 tablet twice a day by oral route as needed.     dextromethorphan-guaiFENesin (MUCINEX DM) 30-600 MG 12hr tablet Take 1 tablet by mouth 2 (two) times daily. 30 tablet 0   etonogestrel (NEXPLANON) 68 MG IMPL implant 1 each by Subdermal  route once.     naproxen (NAPROSYN) 500 MG tablet Take 1 tablet (500 mg total) by mouth 2 (two) times daily. 15 tablet 0   omeprazole (PRILOSEC) 20 MG capsule TAKE 1 CAPSULE BY MOUTH DAILY 30 capsule 3   simvastatin (ZOCOR) 20 MG tablet TAKE 1 TABLET(20 MG) BY MOUTH DAILY 30 tablet 6   tobramycin (TOBREX) 0.3 % ophthalmic solution Place 1 drop into the left eye every 4 (four) hours. 5 mL 0   traMADol (ULTRAM) 50 MG tablet Take 1 tablet (50 mg total) by mouth every 6 (six) hours as needed. 15 tablet 0   Social History   Socioeconomic History   Marital status: Single    Spouse name: Not on file   Number of children: 3   Years of education: Not on file   Highest education level: 9th grade  Occupational History   Not on file  Tobacco Use   Smoking status: Every Day    Packs/day: 0.50    Years: 15.00    Pack years: 7.50    Types: Cigarettes   Smokeless tobacco: Never  Vaping Use   Vaping Use: Never used  Substance and Sexual Activity   Alcohol use: No   Drug use: Never    Types: Hydrocodone   Sexual activity: Yes    Birth control/protection: Implant  Other Topics Concern   Not on file  Social History  Narrative   Not on file   Social Determinants of Health   Financial Resource Strain: Not on file  Food Insecurity: Not on file  Transportation Needs: Not on file  Physical Activity: Not on file  Stress: Not on file  Social Connections: Not on file  Intimate Partner Violence: Not on file   Family History  Problem Relation Age of Onset   Cancer Maternal Grandmother        lung   Cancer Maternal Grandfather        unsure    CAD Father    Stroke Father    Liver disease Father    Alcohol abuse Father    Other Mother        brain tumor   Anxiety disorder Sister    Asthma Son    Bronchitis Son    ADD / ADHD Son    Other Daughter        acid reflux    OBJECTIVE:  Vitals:   06/14/21 1927 06/14/21 1928  BP: 116/67   Pulse: (!) 135   Resp: 20   Temp: 100.1 F  (37.8 C)   TempSrc: Oral   SpO2: 98%   Weight:  138 lb (62.6 kg)  Height:  5' (1.524 m)     General appearance: alert; appears fatigued, but nontoxic; speaking in full sentences and tolerating own secretions HEENT: NCAT; Ears: EACs clear, TMs pearly gray, slightly injected; Eyes: PERRL.  EOM grossly intact.  Nose: nares patent without rhinorrhea, Throat: oropharynx clear, tonsils non erythematous or enlarged, uvula midline  Neck: supple without LAD Lungs: unlabored respirations, symmetrical air entry; cough: absent; no respiratory distress; CTAB Heart: tachycardia Skin: warm and dry Psychological: alert and cooperative; normal mood and affect   ASSESSMENT & PLAN:  1. Exposure to COVID-19 virus   2. Flu-like symptoms     Meds ordered this encounter  Medications   acetaminophen (TYLENOL) tablet 650 mg    COVID testing ordered.  It will take between 5-7 days for test results.  Someone will contact you regarding abnormal results.    In the meantime: You should remain isolated in your home for 5 days from symptom onset AND greater than 72 hours after symptoms resolution (absence of fever without the use of fever-reducing medication and improvement in respiratory symptoms), whichever is longer Get plenty of rest and push fluids Use OTC zyrtec for nasal congestion, runny nose, and/or sore throat Use OTC flonase for nasal congestion and runny nose Use medications daily for symptom relief Use OTC medications like ibuprofen or tylenol as needed fever or pain Call or go to the ED if you have any new or worsening symptoms such as fever, cough, shortness of breath, chest tightness, chest pain, turning blue, changes in mental status, etc...   Reviewed expectations re: course of current medical issues. Questions answered. Outlined signs and symptoms indicating need for more acute intervention. Patient verbalized understanding. After Visit Summary given.          Rennis Harding,  PA-C 06/14/21 1938

## 2021-06-15 ENCOUNTER — Ambulatory Visit: Payer: Medicaid Other | Admitting: Family Medicine

## 2021-06-15 ENCOUNTER — Encounter: Payer: Self-pay | Admitting: Family Medicine

## 2021-06-15 VITALS — BP 125/84 | HR 118 | Temp 98.4°F | Ht 60.0 in | Wt 138.0 lb

## 2021-06-15 DIAGNOSIS — R6889 Other general symptoms and signs: Secondary | ICD-10-CM | POA: Diagnosis not present

## 2021-06-15 DIAGNOSIS — R350 Frequency of micturition: Secondary | ICD-10-CM | POA: Diagnosis not present

## 2021-06-15 DIAGNOSIS — J029 Acute pharyngitis, unspecified: Secondary | ICD-10-CM | POA: Diagnosis not present

## 2021-06-15 DIAGNOSIS — J3489 Other specified disorders of nose and nasal sinuses: Secondary | ICD-10-CM | POA: Diagnosis not present

## 2021-06-15 DIAGNOSIS — R52 Pain, unspecified: Secondary | ICD-10-CM | POA: Diagnosis not present

## 2021-06-15 DIAGNOSIS — R059 Cough, unspecified: Secondary | ICD-10-CM

## 2021-06-15 LAB — COVID-19, FLU A+B NAA
Influenza A, NAA: NOT DETECTED
Influenza B, NAA: NOT DETECTED
SARS-CoV-2, NAA: NOT DETECTED

## 2021-06-15 LAB — CULTURE, GROUP A STREP

## 2021-06-15 LAB — URINALYSIS, ROUTINE W REFLEX MICROSCOPIC
Bilirubin, UA: NEGATIVE
Glucose, UA: NEGATIVE
Ketones, UA: NEGATIVE
Leukocytes,UA: NEGATIVE
Nitrite, UA: NEGATIVE
Protein,UA: NEGATIVE
RBC, UA: NEGATIVE
Specific Gravity, UA: <1.005 — ABNORMAL LOW (ref 1.005–1.030)
Urobilinogen, Ur: 0.2 mg/dL (ref 0.2–1.0)
pH, UA: 6.5 (ref 5.0–7.5)

## 2021-06-15 LAB — RAPID STREP SCREEN (MED CTR MEBANE ONLY): Strep Gp A Ag, IA W/Reflex: NEGATIVE

## 2021-06-15 LAB — VERITOR FLU A/B WAIVED
Influenza A: NEGATIVE
Influenza B: NEGATIVE

## 2021-06-15 MED ORDER — BENZONATATE 200 MG PO CAPS: 200.0000 mg | ORAL_CAPSULE | Freq: Two times a day (BID) | ORAL | 0 refills | Status: DC | PRN

## 2021-06-15 MED ORDER — AZELASTINE HCL 0.1 % NA SOLN: 1.0000 | Freq: Two times a day (BID) | NASAL | 12 refills | Status: DC

## 2021-06-15 MED ORDER — PROMETHAZINE-DM 6.25-15 MG/5ML PO SYRP
2.5000 mL | ORAL_SOLUTION | Freq: Four times a day (QID) | ORAL | 0 refills | Status: DC | PRN
Start: 2021-06-15 — End: 2021-08-20

## 2021-06-15 MED ORDER — MAGIC MOUTHWASH W/LIDOCAINE: ORAL | 0 refills | Status: DC

## 2021-06-15 NOTE — Progress Notes (Signed)
Subjective: CC: URI PCP: [name], FNP [id number] C Mathes is a 35 y.o. female presenting to clinic today for:  1.  Flulike symptoms/ urinary symptoms Patient reports that she had sudden onset of fever, cold chills and body cramping yesterday.  Today she developed a red throat, cough with phlegm and sore throat.  Her fianc and uncle have similar symptoms at onset Sunday.  Everyone has apparently been tested for COVID-19 and both of her family members were negative.  She had test done yesterday at the urgent care but was told it would be 7 days before she got a result.  She reports frequent urination where she has to void every 10 minutes.  Denies any dysuria, hematuria, urgency, pelvic pain.   ROS: Per HPI  Allergies  Allergen Reactions   Amoxicillin Hives   Cephalosporins Nausea And Vomiting   Septra [Bactrim] Nausea And Vomiting   Ciprocinonide [Fluocinolone] Nausea And Vomiting   Penicillins Hives   Past Medical History:  Diagnosis Date   Anxiety    Back pain    Boil 05/14/2016   Breast pain 05/11/2015   Contraceptive management 08/30/2013   GERD (gastroesophageal reflux disease)    Hyperlipidemia    LLQ pain 11/04/2014   OAB (overactive bladder) 04/20/2013   Reflux    Smoker 05/14/2016   Soreness breast 10/25/2013   Left breast sore UOQ, no masses   Urinary frequency 11/04/2014    Current Outpatient Medications:    azithromycin (ZITHROMAX) 250 MG tablet, 2 tablet day 1, 1 tablet day 2-5., Disp: 1 each, Rfl: 0   ciprofloxacin (CIPRO) 500 MG tablet, Take 1 tablet (500 mg total) by mouth 2 (two) times daily. One po bid x 7 days, Disp: 10 tablet, Rfl: 0   cyclobenzaprine (FLEXERIL) 10 MG tablet, cyclobenzaprine 10 mg tablet  Take 1 tablet twice a day by oral route as needed., Disp: , Rfl:    dextromethorphan-guaiFENesin (MUCINEX DM) 30-600 MG 12hr tablet, Take 1 tablet by mouth 2 (two) times daily., Disp: 30 tablet, Rfl: 0   etonogestrel (NEXPLANON) 68 MG IMPL implant,  1 each by Subdermal route once., Disp: , Rfl:    naproxen (NAPROSYN) 500 MG tablet, Take 1 tablet (500 mg total) by mouth 2 (two) times daily., Disp: 15 tablet, Rfl: 0   omeprazole (PRILOSEC) 20 MG capsule, TAKE 1 CAPSULE BY MOUTH DAILY, Disp: 30 capsule, Rfl: 3   simvastatin (ZOCOR) 20 MG tablet, TAKE 1 TABLET(20 MG) BY MOUTH DAILY, Disp: 30 tablet, Rfl: 6   tobramycin (TOBREX) 0.3 % ophthalmic solution, Place 1 drop into the left eye every 4 (four) hours., Disp: 5 mL, Rfl: 0   traMADol (ULTRAM) 50 MG tablet, Take 1 tablet (50 mg total) by mouth every 6 (six) hours as needed., Disp: 15 tablet, Rfl: 0 Social History   Socioeconomic History   Marital status: Single    Spouse name: Not on file   Number of children: 3   Years of education: Not on file   Highest education level: 9th grade  Occupational History   Not on file  Tobacco Use   Smoking status: Every Day    Packs/day: 0.50    Years: 15.00    Pack years: 7.50    Types: Cigarettes   Smokeless tobacco: Never  Vaping Use   Vaping Use: Never used  Substance and Sexual Activity   Alcohol use: No   Drug use: Never    Types: Hydrocodone   Sexual activity: Yes  Birth control/protection: Implant  Other Topics Concern   Not on file  Social History Narrative   Not on file   Social Determinants of Health   Financial Resource Strain: Not on file  Food Insecurity: Not on file  Transportation Needs: Not on file  Physical Activity: Not on file  Stress: Not on file  Social Connections: Not on file  Intimate Partner Violence: Not on file   Family History  Problem Relation Age of Onset   Cancer Maternal Grandmother        lung   Cancer Maternal Grandfather        unsure    CAD Father    Stroke Father    Liver disease Father    Alcohol abuse Father    Other Mother        brain tumor   Anxiety disorder Sister    Asthma Son    Bronchitis Son    ADD / ADHD Son    Other Daughter        acid reflux     Objective: Office vital signs reviewed. BP 125/84   Pulse (!) 118   Temp 98.4 F (36.9 C)   Ht 5' (1.524 m)   Wt 138 lb (62.6 kg)   SpO2 98%   BMI 26.95 kg/m   Physical Examination:  General: Awake, alert, well nourished, No acute distress HEENT: Sclera white.  Moist mucous membranes.  Clear rhinorrhea noted.  She does have oropharyngeal erythema with grade 2 tonsillar enlargement.  No exudates appreciated.  No lymphadenopathy GU: no CVA TTP Cardio: Slightly tachycardic with regular rhythm, S1S2 heard, no murmurs appreciated Pulm: clear to auscultation bilaterally, no wheezes, rhonchi or rales; normal work of breathing on room air.  Results for orders placed or performed in visit on 06/15/21 (from the past 24 hour(s))  Urinalysis, Routine w reflex microscopic     Status: Abnormal   Collection Time: 06/15/21  4:47 PM  Result Value Ref Range   Specific Gravity, UA <1.005 (L) 1.005 - 1.030   pH, UA 6.5 5.0 - 7.5   Color, UA Yellow Yellow   Appearance Ur Clear Clear   Leukocytes,UA Negative Negative   Protein,UA Negative Negative/Trace   Glucose, UA Negative Negative   Ketones, UA Negative Negative   RBC, UA Negative Negative   Bilirubin, UA Negative Negative   Urobilinogen, Ur 0.2 0.2 - 1.0 mg/dL   Nitrite, UA Negative Negative   Narrative   Performed at:  79 - [facility] [address] [place], [place]  [id number] Lab Director: Rockie Neighbours [facility], Phone:  [phone]  Veritor Flu A/B Waived     Status: None   Collection Time: 06/15/21  4:48 PM  Result Value Ref Range   Influenza A Negative Negative   Influenza B Negative Negative   Narrative   Performed at:  01 - [facility] [address], [place], [place]  [id number] Lab Director: Rockie Neighbours [facility], Phone:  [phone]  Rapid Strep Screen ([facility] ONLY)     Status: None   Collection Time: 06/15/21  4:48 PM   Specimen: Other   Other  Result Value Ref Range   Strep Gp A Ag, IA  W/Reflex Negative Negative   Narrative   Performed at:  01 - [facility] [address] [place], [place]  [id number] Lab Director: Rockie Neighbours [facility], Phone:  [phone]  Culture, Group A Strep     Status: None   Collection Time: 06/15/21  4:48 PM   Other  Result Value Ref Range   Strep A Culture CANCELED    Narrative   Performed at:  [address] - [facility] [address], [place], [place]  [id number] Lab Director: Jolene Schimke MD, Phone:  [phone]     Assessment/ Plan: 35 y.o. female   Frequent urination - Plan: Urinalysis, Routine w reflex microscopic  Flu-like symptoms - Plan: Veritor Flu A/B Waived, Novel Coronavirus, NAA ([facility]), Rapid Strep Screen ([facility] ONLY)  Sore throat - Plan: magic mouthwash w/lidocaine SOLN  Rhinorrhea - Plan: azelastine (ASTELIN) 0.1 % nasal spray  Cough - Plan: benzonatate (TESSALON) 200 MG capsule, promethazine-dextromethorphan (PROMETHAZINE-DM) 6.25-15 MG/5ML syrup  No evidence of urinary tract infection on urinalysis.  Frequent urination may be a manifestation of viral illness.  Encouraged ongoing hydration with electrolytes.  Follow with PCP if symptoms worsen or she develops any other concerning symptoms or signs  Influenza was negative.  Strep negative.  COVID-19 sent off.  We will treat her symptomatically with Magic mouthwash with lidocaine for sore throat, Astelin for rhinorrhea and cough suppressants.  She will follow-up as needed    No orders of the defined types were placed in this encounter.  No orders of the defined types were placed in this encounter.    Raliegh Ip, DO [facility] [phone]

## 2021-06-15 NOTE — Patient Instructions (Addendum)
No evidence of UTI.  Frequent urination may be a manifestation of your acute viral illness.  If you start developing burning, blood, mid back pain that is new, nausea, please seek reevaluation.  It appears that you have a viral upper respiratory infection (cold).  Cold symptoms can last up to 2 weeks.    - Get plenty of rest and drink plenty of fluids. - Try to breathe moist air. Use a cold mist humidifier. - Consume warm fluids (soup or tea) to provide relief for a stuffy nose and to loosen phlegm. - For nasal stuffiness, try saline nasal spray or a Neti Pot. Afrin nasal spray can also be used but this product should not be used longer than 3 days or it will cause rebound nasal stuffiness (worsening nasal congestion). - For sore throat pain relief: use chloraseptic spray, suck on throat lozenges, hard candy or popsicles; gargle with warm salt water (1/4 tsp. salt per 8 oz. of water); and eat soft, bland foods. - Eat a well-balanced diet. If you cannot, ensure you are getting enough nutrients by taking a daily multivitamin. - Avoid dairy products, as they can thicken phlegm. - Avoid alcohol, as it impairs your body's immune system.  CONTACT YOUR DOCTOR IF YOU EXPERIENCE ANY OF THE FOLLOWING: - High fever - Ear pain - Sinus-type headache - Unusually severe cold symptoms - Cough that gets worse while other cold symptoms improve - Flare up of any chronic lung problem, such as asthma - Your symptoms persist longer than 2 weeks

## 2021-06-16 ENCOUNTER — Other Ambulatory Visit: Payer: Self-pay | Admitting: Family Medicine

## 2021-06-16 ENCOUNTER — Encounter: Payer: Self-pay | Admitting: Family Medicine

## 2021-06-16 DIAGNOSIS — U071 COVID-19: Secondary | ICD-10-CM

## 2021-06-16 LAB — SARS-COV-2, NAA 2 DAY TAT

## 2021-06-16 LAB — NOVEL CORONAVIRUS, NAA: SARS-CoV-2, NAA: DETECTED — AB

## 2021-06-16 MED ORDER — MOLNUPIRAVIR EUA 200MG CAPSULE: 4.0000 | ORAL_CAPSULE | Freq: Two times a day (BID) | ORAL | 0 refills | Status: DC

## 2021-06-16 MED ORDER — MOLNUPIRAVIR EUA 200MG CAPSULE: 4.0000 | ORAL_CAPSULE | Freq: Two times a day (BID) | ORAL | 0 refills | Status: AC

## 2021-06-16 NOTE — Progress Notes (Signed)
Called patient to inform of results.  She actually is feeling pretty good today.  Resolved myalgia, fever.  Ok to proceed with Molnupiravir to reduce complications/ etc. IUD in place for pregnancy prevention but counseled on avoiding pregnancy for 3 months following treatment.  Ok to return to normal activity in 1 week since asymptomatic today.  Meds ordered this encounter  Medications   DISCONTD: molnupiravir EUA 200 mg CAPS    Sig: Take 4 capsules (800 mg total) by mouth 2 (two) times daily for 5 days.    Dispense:  40 capsule    Refill:  0   molnupiravir EUA 200 mg CAPS    Sig: Take 4 capsules (800 mg total) by mouth 2 (two) times daily for 5 days.    Dispense:  40 capsule    Refill:  0    Dx: 7/16, onset of symptoms 7/15. Risk factors include obesity. Has IUD in place for pregnancy prevention    Zayn Selley M. Nadine Counts, DO [facility]

## 2021-06-28 ENCOUNTER — Other Ambulatory Visit: Payer: Medicaid Other | Admitting: Adult Health

## 2021-07-04 ENCOUNTER — Telehealth (INDEPENDENT_AMBULATORY_CARE_PROVIDER_SITE_OTHER): Payer: Medicaid Other | Admitting: Family Medicine

## 2021-07-04 ENCOUNTER — Encounter: Payer: Self-pay | Admitting: Family Medicine

## 2021-07-04 DIAGNOSIS — G479 Sleep disorder, unspecified: Secondary | ICD-10-CM

## 2021-07-04 DIAGNOSIS — R431 Parosmia: Secondary | ICD-10-CM | POA: Diagnosis not present

## 2021-07-04 MED ORDER — TRAZODONE HCL 50 MG PO TABS
50.0000 mg | ORAL_TABLET | Freq: Every evening | ORAL | 2 refills | Status: DC | PRN
Start: 2021-07-04 — End: 2021-08-20

## 2021-07-04 NOTE — Progress Notes (Signed)
Virtual Visit via Video note  I connected with Rebecca [name] on 07/04/21 at 2:38 PM by video and verified that I am speaking with the correct person using two identifiers. Rebecca [name] is currently located in her vehicle and her uncle and daughter are currently with her during visit. The provider, Gwenlyn Fudge, FNP is located in their office at time of visit.  I discussed the limitations, risks, security and privacy concerns of performing an evaluation and management service by video and the availability of in person appointments. I also discussed with the patient that there may be a patient responsible charge related to this service. The patient expressed understanding and agreed to proceed.  Subjective: PCP: Rebecca Spencer, FNP  Chief Complaint  Patient presents with   Smelling foul odor   Patient reports ever since she had COVID-19 last month she smells something that smells dead. No bleeding or drainage coming from her nose.   Patient also reports she has trouble falling asleep at night. States she cannot make her brain turn off. It takes 2-3 hours to fall asleep. Failed treated with OTC Unisom, Melatonin, and Zzzquil. States she use to have a prescription for Xanax but this was taken away from her because her friend lied on her.    ROS: Per HPI  Current Outpatient Medications:    azelastine (ASTELIN) 0.1 % nasal spray, Place 1 spray into both nostrils 2 (two) times daily., Disp: 30 mL, Rfl: 12   benzonatate (TESSALON) 200 MG capsule, Take 1 capsule (200 mg total) by mouth 2 (two) times daily as needed for cough., Disp: 20 capsule, Rfl: 0   cyclobenzaprine (FLEXERIL) 10 MG tablet, cyclobenzaprine 10 mg tablet  Take 1 tablet twice a day by oral route as needed., Disp: , Rfl:    dextromethorphan-guaiFENesin (MUCINEX DM) 30-600 MG 12hr tablet, Take 1 tablet by mouth 2 (two) times daily., Disp: 30 tablet, Rfl: 0   etonogestrel (NEXPLANON) 68 MG IMPL implant, 1 each by  Subdermal route once., Disp: , Rfl:    magic mouthwash w/lidocaine SOLN, Gargle and spit 35mL every 6 hours as needed for sore throat. 50mL lidocaine, 54mL nystatin, 60mg  hydrocortisone tab, qs benadryl total ., Disp: 480 mL, Rfl: 0   naproxen (NAPROSYN) 500 MG tablet, Take 1 tablet (500 mg total) by mouth 2 (two) times daily., Disp: 15 tablet, Rfl: 0   omeprazole (PRILOSEC) 20 MG capsule, TAKE 1 CAPSULE BY MOUTH DAILY, Disp: 30 capsule, Rfl: 3   promethazine-dextromethorphan (PROMETHAZINE-DM) 6.25-15 MG/5ML syrup, Take 2.5 mLs by mouth 4 (four) times daily as needed for cough (can cause sleepiness)., Disp: 118 mL, Rfl: 0   simvastatin (ZOCOR) 20 MG tablet, TAKE 1 TABLET(20 MG) BY MOUTH DAILY, Disp: 30 tablet, Rfl: 6   tobramycin (TOBREX) 0.3 % ophthalmic solution, Place 1 drop into the left eye every 4 (four) hours., Disp: 5 mL, Rfl: 0   traMADol (ULTRAM) 50 MG tablet, Take 1 tablet (50 mg total) by mouth every 6 (six) hours as needed., Disp: 15 tablet, Rfl: 0  Allergies  Allergen Reactions   Amoxicillin Hives   Cephalosporins Nausea And Vomiting   Septra [Bactrim] Nausea And Vomiting   Ciprocinonide [Fluocinolone] Nausea And Vomiting   Penicillins Hives   Past Medical History:  Diagnosis Date   Anxiety    Back pain    Boil 05/14/2016   Breast pain 05/11/2015   Contraceptive management 08/30/2013   GERD (gastroesophageal reflux disease)    Hyperlipidemia  LLQ pain 11/04/2014   OAB (overactive bladder) 04/20/2013   Reflux    Smoker 05/14/2016   Soreness breast 10/25/2013   Left breast sore UOQ, no masses   Urinary frequency 11/04/2014    Observations/Objective: Physical Exam Constitutional:      General: She is not in acute distress.    Appearance: Normal appearance. She is not ill-appearing or toxic-appearing.  Eyes:     General: No scleral icterus.       Right eye: No discharge.        Left eye: No discharge.     Conjunctiva/sclera: Conjunctivae normal.  Pulmonary:      Effort: Pulmonary effort is normal. No respiratory distress.  Neurological:     Mental Status: She is alert and oriented to person, place, and time.  Psychiatric:        Mood and Affect: Mood normal.        Behavior: Behavior normal.        Thought Content: Thought content normal.        Judgment: Judgment normal.   Assessment and Plan: 1. Parosmia Smell retrained education provided on AVS. Patient to access on MyChart.  2. Difficulty sleeping - traZODone (DESYREL) 50 MG tablet; Take 1 tablet (50 mg total) by mouth at bedtime as needed for sleep.  Dispense: 30 tablet; Refill: 2   Follow Up Instructions: Return in about 3 weeks (around 07/25/2021) for sleep with PCP.   I discussed the assessment and treatment plan with the patient. The patient was provided an opportunity to ask questions and all were answered. The patient agreed with the plan and demonstrated an understanding of the instructions.   The patient was advised to call back or seek an in-person evaluation if the symptoms worsen or if the condition fails to improve as anticipated.  The above assessment and management plan was discussed with the patient. The patient verbalized understanding of and has agreed to the management plan. Patient is aware to call the clinic if symptoms persist or worsen. Patient is aware when to return to the clinic for a follow-up visit. Patient educated on when it is appropriate to go to the emergency department.   Time call ended: 2:52 PM   I provided 14 minutes of face-to-face time during this encounter.   Deliah Boston, MSN, APRN, FNP-C [facility] 07/04/21

## 2021-07-04 NOTE — Patient Instructions (Signed)
Olfactory training typically involves deeply sniffing at least four different odors for 10 seconds twice daily for at least 12 weeks. The odorants used are distinct and strong and typically include flowery (eg, rose), fruity (eg, lemon), aromatic (eg, clove), and resinous (eg, eucalyptus). Other odorants such as cinnamon, vanilla, orange, and banana have been used. Patients may perform olfactory training at home on their own; preassembled "smell training kits" may be purchased, or vials of essential oils can be used for this purpose.

## 2021-08-16 ENCOUNTER — Ambulatory Visit: Payer: Medicaid Other | Admitting: Family Medicine

## 2021-08-16 ENCOUNTER — Encounter: Payer: Self-pay | Admitting: Family Medicine

## 2021-08-16 DIAGNOSIS — J069 Acute upper respiratory infection, unspecified: Secondary | ICD-10-CM

## 2021-08-16 NOTE — Progress Notes (Signed)
Virtual Visit via telephone Note Due to COVID-19 pandemic this visit was conducted virtually. This visit type was conducted due to national recommendations for restrictions regarding the COVID-19 Pandemic (e.g. social distancing, sheltering in place) in an effort to limit this patient's exposure and mitigate transmission in our community. All issues noted in this document were discussed and addressed.  A physical exam was not performed with this format.   I connected with Rebecca [name] on 08/16/2021 at 1355 by telephone and verified that I am speaking with the correct person using two identifiers. Rebecca [name] is currently located at home and  no one is currently with them during visit. The provider, Kari Baars, FNP is located in their office at time of visit.  I discussed the limitations, risks, security and privacy concerns of performing an evaluation and management service by telephone and the availability of in person appointments. I also discussed with the patient that there may be a patient responsible charge related to this service. The patient expressed understanding and agreed to proceed.  Subjective:  Patient ID: Rebecca [name], female    DOB: August 05, 1986, 35 y.o.   MRN: [id number]  Chief Complaint:  Nasal Congestion and Cough   HPI: Rebecca [name] is a 35 y.o. female presenting on 08/16/2021 for Nasal Congestion and Cough   Pt reports she woke up this morning with cough, head congestion, and raspy voice. She denies known sick contacts. No fever, chills, weakness, or fatigue.   Cough This is a new problem. The current episode started today. The problem has been unchanged. The cough is Non-productive. Associated symptoms include nasal congestion, postnasal drip and rhinorrhea. Pertinent negatives include no chest pain, chills, ear congestion, ear pain, fever, headaches, heartburn, hemoptysis, myalgias, rash, sore throat, shortness of breath, sweats, weight loss or  wheezing. Nothing aggravates the symptoms. She has tried nothing for the symptoms.    Relevant past medical, surgical, family, and social history reviewed and updated as indicated.  Allergies and medications reviewed and updated.   Past Medical History:  Diagnosis Date   Anxiety    Back pain    Boil 05/14/2016   Breast pain 05/11/2015   Contraceptive management 08/30/2013   GERD (gastroesophageal reflux disease)    Hyperlipidemia    LLQ pain 11/04/2014   OAB (overactive bladder) 04/20/2013   Reflux    Smoker 05/14/2016   Soreness breast 10/25/2013   Left breast sore UOQ, no masses   Urinary frequency 11/04/2014    Past Surgical History:  Procedure Laterality Date   NO PAST SURGERIES      Social History   Socioeconomic History   Marital status: Single    Spouse name: Not on file   Number of children: 3   Years of education: Not on file   Highest education level: 9th grade  Occupational History   Not on file  Tobacco Use   Smoking status: Every Day    Packs/day: 0.50    Years: 15.00    Pack years: 7.50    Types: Cigarettes   Smokeless tobacco: Never  Vaping Use   Vaping Use: Never used  Substance and Sexual Activity   Alcohol use: No   Drug use: Never    Types: Hydrocodone   Sexual activity: Yes    Birth control/protection: Implant  Other Topics Concern   Not on file  Social History Narrative   Not on file   Social Determinants of Health   Financial Resource Strain: Not  on file  Food Insecurity: Not on file  Transportation Needs: Not on file  Physical Activity: Not on file  Stress: Not on file  Social Connections: Not on file  Intimate Partner Violence: Not on file    Outpatient Encounter Medications as of 08/16/2021  Medication Sig   azelastine (ASTELIN) 0.1 % nasal spray Place 1 spray into both nostrils 2 (two) times daily.   benzonatate (TESSALON) 200 MG capsule Take 1 capsule (200 mg total) by mouth 2 (two) times daily as needed for cough.    cyclobenzaprine (FLEXERIL) 10 MG tablet cyclobenzaprine 10 mg tablet  Take 1 tablet twice a day by oral route as needed.   dextromethorphan-guaiFENesin (MUCINEX DM) 30-600 MG 12hr tablet Take 1 tablet by mouth 2 (two) times daily.   etonogestrel (NEXPLANON) 68 MG IMPL implant 1 each by Subdermal route once.   magic mouthwash w/lidocaine SOLN Gargle and spit 11mL every 6 hours as needed for sore throat. 12mL lidocaine, 56mL nystatin, 60mg  hydrocortisone tab, qs benadryl total .   naproxen (NAPROSYN) 500 MG tablet Take 1 tablet (500 mg total) by mouth 2 (two) times daily.   omeprazole (PRILOSEC) 20 MG capsule TAKE 1 CAPSULE BY MOUTH DAILY   promethazine-dextromethorphan (PROMETHAZINE-DM) 6.25-15 MG/5ML syrup Take 2.5 mLs by mouth 4 (four) times daily as needed for cough (can cause sleepiness).   simvastatin (ZOCOR) 20 MG tablet TAKE 1 TABLET(20 MG) BY MOUTH DAILY   tobramycin (TOBREX) 0.3 % ophthalmic solution Place 1 drop into the left eye every 4 (four) hours.   traMADol (ULTRAM) 50 MG tablet Take 1 tablet (50 mg total) by mouth every 6 (six) hours as needed.   traZODone (DESYREL) 50 MG tablet Take 1 tablet (50 mg total) by mouth at bedtime as needed for sleep.   No facility-administered encounter medications on file as of 08/16/2021.    Allergies  Allergen Reactions   Amoxicillin Hives   Cephalosporins Nausea And Vomiting   Septra [Bactrim] Nausea And Vomiting   Ciprocinonide [Fluocinolone] Nausea And Vomiting   Penicillins Hives    Review of Systems  Constitutional:  Negative for activity change, appetite change, chills, fatigue, fever and weight loss.  HENT:  Positive for congestion, postnasal drip, rhinorrhea and voice change. Negative for dental problem, drooling, ear discharge, ear pain, facial swelling, hearing loss, mouth sores, nosebleeds, sinus pressure, sinus pain, sneezing, sore throat, tinnitus and trouble swallowing.   Eyes: Negative.   Respiratory:  Positive for cough.  Negative for hemoptysis, chest tightness, shortness of breath and wheezing.   Cardiovascular:  Negative for chest pain, palpitations and leg swelling.  Gastrointestinal:  Negative for abdominal pain, blood in stool, constipation, diarrhea, heartburn, nausea and vomiting.  Endocrine: Negative.   Genitourinary:  Negative for decreased urine volume, difficulty urinating, dysuria, frequency and urgency.  Musculoskeletal:  Negative for arthralgias and myalgias.  Skin: Negative.  Negative for rash.  Allergic/Immunologic: Negative.   Neurological:  Negative for dizziness, weakness and headaches.  Hematological: Negative.   Psychiatric/Behavioral:  Negative for confusion, hallucinations, sleep disturbance and suicidal ideas.   All other systems reviewed and are negative.       Observations/Objective: No vital signs or physical exam, this was a telephone or virtual health encounter.  Pt alert and oriented, answers all questions appropriately, and able to speak in full sentences.    Assessment and Plan: Laxmi was seen today for nasal congestion and cough.  Diagnoses and all orders for this visit:  URI with cough and congestion Likely  viral in nature, will test for COVID-19. No indications of acute bacterial infection. Symptomatic care discussed in detail: Mucinex with plenty of water, tylenol, robitussin or delsym for cough. Report any new, worsening, or persistent symptoms.  -     Novel Coronavirus, NAA ([facility])    Follow Up Instructions: Return if symptoms worsen or fail to improve.    I discussed the assessment and treatment plan with the patient. The patient was provided an opportunity to ask questions and all were answered. The patient agreed with the plan and demonstrated an understanding of the instructions.   The patient was advised to call back or seek an in-person evaluation if the symptoms worsen or if the condition fails to improve as anticipated.  The above assessment  and management plan was discussed with the patient. The patient verbalized understanding of and has agreed to the management plan. Patient is aware to call the clinic if they develop any new symptoms or if symptoms persist or worsen. Patient is aware when to return to the clinic for a follow-up visit. Patient educated on when it is appropriate to go to the emergency department.    I provided 12 minutes of non-face-to-face time during this encounter. The call started at 1355. The call ended at 1405. The other time was used for coordination of care.    Kari Baars, FNP-C [facility] [address] [place], [place] [address] [phone] 08/16/2021

## 2021-08-20 ENCOUNTER — Encounter: Payer: Self-pay | Admitting: Adult Health

## 2021-08-20 ENCOUNTER — Other Ambulatory Visit: Payer: Self-pay

## 2021-08-20 ENCOUNTER — Ambulatory Visit (INDEPENDENT_AMBULATORY_CARE_PROVIDER_SITE_OTHER): Payer: Medicaid Other | Admitting: Adult Health

## 2021-08-20 VITALS — BP 117/81 | HR 76 | Ht 61.0 in | Wt 136.0 lb

## 2021-08-20 DIAGNOSIS — N3281 Overactive bladder: Secondary | ICD-10-CM

## 2021-08-20 DIAGNOSIS — E7800 Pure hypercholesterolemia, unspecified: Secondary | ICD-10-CM

## 2021-08-20 DIAGNOSIS — Z975 Presence of (intrauterine) contraceptive device: Secondary | ICD-10-CM

## 2021-08-20 DIAGNOSIS — Z01419 Encounter for gynecological examination (general) (routine) without abnormal findings: Secondary | ICD-10-CM | POA: Insufficient documentation

## 2021-08-20 DIAGNOSIS — K582 Mixed irritable bowel syndrome: Secondary | ICD-10-CM

## 2021-08-20 MED ORDER — SIMVASTATIN 20 MG PO TABS: ORAL_TABLET | ORAL | 12 refills | Status: DC

## 2021-08-20 MED ORDER — OXYBUTYNIN CHLORIDE ER 10 MG PO TB24: 10.0000 mg | ORAL_TABLET | Freq: Every day | ORAL | 6 refills | Status: DC

## 2021-08-20 MED ORDER — DICYCLOMINE HCL 10 MG PO CAPS: 10.0000 mg | ORAL_CAPSULE | Freq: Three times a day (TID) | ORAL | 6 refills | Status: DC

## 2021-08-20 MED ORDER — OMEPRAZOLE 20 MG PO CPDR: DELAYED_RELEASE_CAPSULE | ORAL | 12 refills | Status: DC

## 2021-08-20 NOTE — Progress Notes (Signed)
Patient ID: Rebecca [name], female   DOB: 1986-11-15, 35 y.o.   MRN: [id number] History of Present Illness:  Rebecca [name] is a 35 year old white female,single, [id number], in for a well woman gyn exam. Lab Results  Component Value Date   DIAGPAP  06/06/2020    - Negative for intraepithelial lesion or malignancy (NILM)   HPV NOT DETECTED 07/07/2017   HPVHIGH Negative 06/06/2020   PCP is C. Hawks NP  Current Medications, Allergies, Past Medical History, Past Surgical History, Family History and Social History were reviewed in [name].     Review of Systems:  Patient denies any headaches, hearing loss, fatigue, blurred vision, shortness of breath, chest pain, abdominal pain, problems with intercourse. No joint pain or mood swings.  She is having stomach cramps and diarrhea or constipation  +OAB,has to pee often she says   Physical Exam:BP 117/81 (BP Location: Left Arm, Patient Position: Sitting, Cuff Size: Normal)   Pulse 76   Ht 5\' 1"  (1.549 m)   Wt 136 lb (61.7 kg)   BMI 25.70 kg/m   General:  Well developed, well nourished, no acute distress Skin:  Warm and dry Neck:  Midline trachea, normal thyroid, good ROM, no lymphadenopathy Lungs; Clear to auscultation bilaterally Breast:  No dominant palpable mass, retraction, or nipple discharge Cardiovascular: Regular rate and rhythm Abdomen:  Soft, non tender, no hepatosplenomegaly Pelvic:  External genitalia is normal in appearance, no lesions.  The vagina is normal in appearance. Urethra has no lesions or masses. The cervix is bulbous.  Uterus is felt to be normal size, shape, and contour.  No adnexal masses or tenderness noted.Bladder is non tender, no masses felt. Rectal: Deferred Extremities/musculoskeletal:  No swelling or varicosities noted, no clubbing or cyanosis Psych:  No mood changes, alert and cooperative,seems happy AA is 2 Fall risk is low Depression screen [facility] 2/9 08/20/2021 11/10/2020  06/06/2020  Decreased Interest 0 0 0  Down, Depressed, Hopeless 0 0 0  PHQ - 2 Score 0 0 0  Altered sleeping 1 - 1  Tired, decreased energy 1 - 0  Change in appetite 0 - 2  Feeling bad or failure about yourself  0 - 0  Trouble concentrating 0 - 0  Moving slowly or fidgety/restless 0 - 0  Suicidal thoughts 0 - 0  PHQ-9 Score 2 - 3  Difficult doing work/chores - - Not difficult at all    GAD 7 : Generalized Anxiety Score 08/20/2021 06/06/2020  Nervous, Anxious, on Edge 0 1  Control/stop worrying 0 0  Worry too much - different things 0 0  Trouble relaxing 0 0  Restless 1 0  Easily annoyed or irritable 0 0  Afraid - awful might happen 0 0  Total GAD 7 Score 1 1  Anxiety Difficulty - Not difficult at all   .  Upstream - 08/20/21 1054       Pregnancy Intention Screening   Does the patient want to become pregnant in the next year? No    Does the patient's partner want to become pregnant in the next year? No    Would the patient like to discuss contraceptive options today? No      Contraception Wrap Up   Current Method Hormonal Implant    End Method Hormonal Implant    Contraception Counseling Provided No            Examination chaperoned by Tish RN   Impression and Plan: 1. Encounter for  well woman exam with routine gynecological exam Physical in 1 year Pap in 2024   2. OAB (overactive bladder) Will try oxybutynin Meds ordered this encounter  Medications   omeprazole (PRILOSEC) 20 MG capsule    Sig: TAKE 1 CAPSULE BY MOUTH DAILY    Dispense:  30 capsule    Refill:  12    Order Specific Question:   Supervising Provider    Answer:   Duane Lope H [[id number]]   simvastatin (ZOCOR) 20 MG tablet    Sig: TAKE 1 TABLET(20 MG) BY MOUTH DAILY    Dispense:  30 tablet    Refill:  12    Order Specific Question:   Supervising Provider    Answer:   Duane Lope H [[id number]]   oxybutynin (DITROPAN-XL) 10 MG 24 hr tablet    Sig: Take 1 tablet (10 mg total) by mouth at bedtime.     Dispense:  30 tablet    Refill:  6    Order Specific Question:   Supervising Provider    Answer:   Duane Lope H [[id number]]   dicyclomine (BENTYL) 10 MG capsule    Sig: Take 1 capsule (10 mg total) by mouth 3 (three) times daily before meals.    Dispense:  90 capsule    Refill:  6    Order Specific Question:   Supervising Provider    Answer:   Duane Lope H [[id number]]    Follow up in 6 months or sooner if needed   3. Nexplanon in place   4. Irritable bowel syndrome with both constipation and diarrhea Will try bentyl Refilled prilosex   5. Elevated cholesterol Refilled zocor Will check labs in 6 months

## 2021-08-27 ENCOUNTER — Encounter: Payer: Self-pay | Admitting: Family Medicine

## 2021-08-27 ENCOUNTER — Other Ambulatory Visit: Payer: Self-pay | Admitting: Family

## 2021-08-27 MED ORDER — DOXYCYCLINE HYCLATE 100 MG PO TABS: 100.0000 mg | ORAL_TABLET | Freq: Two times a day (BID) | ORAL | 0 refills | Status: DC

## 2021-10-06 ENCOUNTER — Ambulatory Visit
Admission: EM | Admit: 2021-10-06 | Discharge: 2021-10-06 | Disposition: A | Discharge status: Home / Self Care | Payer: Medicaid Other | Attending: Family Medicine | Admitting: Family Medicine

## 2021-10-06 ENCOUNTER — Other Ambulatory Visit: Payer: Self-pay

## 2021-10-06 ENCOUNTER — Encounter: Payer: Self-pay | Admitting: Emergency Medicine

## 2021-10-06 DIAGNOSIS — J029 Acute pharyngitis, unspecified: Secondary | ICD-10-CM

## 2021-10-06 LAB — POCT RAPID STREP A (OFFICE): Rapid Strep A Screen: NEGATIVE

## 2021-10-06 NOTE — ED Triage Notes (Signed)
Pt is present today with nasal congestion, right ear pain, sore throat, and sinus pressure. Pt sx started yesterday.

## 2021-10-06 NOTE — Discharge Instructions (Addendum)
You have been tested for COVID-19 and influenza today. If your test returns positive, you will receive a phone call from [facility] regarding your results. Negative test results are not called. Both positive and negative results area always visible on MyChart. If you do not have a MyChart account, sign up instructions are provided in your discharge papers. Please do not hesitate to contact us should you have questions or concerns.  You may use over the counter ibuprofen or acetaminophen as needed.  For a sore throat, over the counter products such as [employer] Peroxyl Mouth Sore Rinse or Chloraseptic Sore Throat Spray may provide some temporary relief. Your rapid strep test was negative today. We have sent your throat swab for culture and will let you know of any positive results.

## 2021-10-06 NOTE — ED Provider Notes (Signed)
[facility]   [id number] 10/06/21 Arrival Time: 6295  ASSESSMENT & PLAN:  1. Sore throat    Discussed typical duration of viral illnesses. Rapid strep negative.  Throat culture sent. COVID-19/influenza testing sent. OTC symptom care as needed.    Discharge Instructions      You have been tested for COVID-19 and influenza today. If your test returns positive, you will receive a phone call from [facility] regarding your results. Negative test results are not called. Both positive and negative results area always visible on MyChart. If you do not have a MyChart account, sign up instructions are provided in your discharge papers. Please do not hesitate to contact us should you have questions or concerns.  You may use over the counter ibuprofen or acetaminophen as needed.  For a sore throat, over the counter products such as [employer] Peroxyl Mouth Sore Rinse or Chloraseptic Sore Throat Spray may provide some temporary relief. Your rapid strep test was negative today. We have sent your throat swab for culture and will let you know of any positive results.        Follow-up Information     [name], FNP.   Specialty: Family Medicine Why: As needed. Contact information: [address] [place] [address] [phone]                 Reviewed expectations re: course of current medical issues. Questions answered. Outlined signs and symptoms indicating need for more acute intervention. Understanding verbalized. After Visit Summary given.   SUBJECTIVE: History from: patient. Rebecca [name] is a 35 y.o. female who reports: ST and R otalgia; x 1-2 d. No resp symptoms.. Denies: fever. Normal PO intake without n/v/d.   OBJECTIVE:  Vitals:   10/06/21 0913  BP: 117/80  Pulse: 90  Resp: 18  Temp: 98.3 F (36.8 C)  SpO2: 99%    General appearance: alert; no distress Eyes: PERRLA; EOMI; conjunctiva normal HENT: Jenn[place]; AT; with nasal  congestion; throat erythematous with enlarged non-exudative tonsils Neck: supple  Lungs: speaks full sentences without difficulty; unlabored Extremities: no edema Skin: warm and dry Neurologic: normal gait Psychological: alert and cooperative; normal mood and affect  Labs: Results for orders placed or performed during the hospital encounter of 10/06/21  POCT rapid strep A  Result Value Ref Range   Rapid Strep A Screen Negative Negative   Labs Reviewed  COVID-19, FLU A+B NAA  CULTURE, GROUP A STREP [facility])  POCT RAPID STREP A (OFFICE)      Allergies  Allergen Reactions   Amoxicillin Hives   Cephalosporins Nausea And Vomiting   Septra [Bactrim] Nausea And Vomiting   Ciprocinonide [Fluocinolone] Nausea And Vomiting   Penicillins Hives    Past Medical History:  Diagnosis Date   Anxiety    Back pain    Boil 05/14/2016   Breast pain 05/11/2015   Contraceptive management 08/30/2013   GERD (gastroesophageal reflux disease)    Hyperlipidemia    LLQ pain 11/04/2014   OAB (overactive bladder) 04/20/2013   Reflux    Smoker 05/14/2016   Soreness breast 10/25/2013   Left breast sore UOQ, no masses   Urinary frequency 11/04/2014   Social History   Socioeconomic History   Marital status: Single    Spouse name: Not on file   Number of children: 3   Years of education: Not on file   Highest education level: 9th grade  Occupational History   Not on file  Tobacco Use  Smoking status: Every Day    Packs/day: 0.50    Years: 15.00    Pack years: 7.50    Types: Cigarettes   Smokeless tobacco: Never  Vaping Use   Vaping Use: Never used  Substance and Sexual Activity   Alcohol use: No   Drug use: Never    Types: Hydrocodone   Sexual activity: Yes    Birth control/protection: Implant  Other Topics Concern   Not on file  Social History Narrative   Not on file   Social Determinants of Health   Financial Resource Strain: Low Risk    Difficulty of Paying Living Expenses: Not  very hard  Food Insecurity: No Food Insecurity   Worried About Programme researcher, broadcasting/film/video in the Last Year: Never true   Ran Out of Food in the Last Year: Never true  Transportation Needs: No Transportation Needs   Lack of Transportation (Medical): No   Lack of Transportation (Non-Medical): No  Physical Activity: Insufficiently Active   Days of Exercise per Week: 3 days   Minutes of Exercise per Session: 30 min  Stress: No Stress Concern Present   Feeling of Stress : Only a little  Social Connections: Moderately Isolated   Frequency of Communication with Friends and Family: Twice a week   Frequency of Social Gatherings with Friends and Family: Twice a week   Attends Religious Services: 1 to 4 times per year   Active Member of [employer] or Organizations: No   Attends Banker Meetings: Never   Marital Status: Divorced  Catering manager Violence: Not At Risk   Fear of Current or Ex-Partner: No   Emotionally Abused: No   Physically Abused: No   Sexually Abused: No   Family History  Problem Relation Age of Onset   Cancer Maternal Grandmother        lung   Cancer Maternal Grandfather        unsure    CAD Father    Stroke Father    Liver disease Father    Alcohol abuse Father    Other Mother        brain tumor   Anxiety disorder Sister    Asthma Son    Bronchitis Son    ADD / ADHD Son    Other Daughter        acid reflux   Past Surgical History:  Procedure Laterality Date   NO PAST SURGERIES       Mardella Layman, MD 10/06/21 1005

## 2021-10-07 LAB — COVID-19, FLU A+B NAA
Influenza A, NAA: NOT DETECTED
Influenza B, NAA: NOT DETECTED
SARS-CoV-2, NAA: NOT DETECTED

## 2021-10-09 LAB — CULTURE, GROUP A STREP (THRC)

## 2021-12-31 ENCOUNTER — Other Ambulatory Visit: Payer: Self-pay

## 2021-12-31 ENCOUNTER — Encounter: Payer: Self-pay | Admitting: Family Medicine

## 2021-12-31 ENCOUNTER — Ambulatory Visit: Payer: Medicaid Other | Admitting: Family Medicine

## 2021-12-31 VITALS — BP 112/78 | HR 90 | Temp 98.2°F | Ht 61.0 in | Wt 138.0 lb

## 2021-12-31 DIAGNOSIS — R350 Frequency of micturition: Secondary | ICD-10-CM | POA: Insufficient documentation

## 2021-12-31 DIAGNOSIS — N898 Other specified noninflammatory disorders of vagina: Secondary | ICD-10-CM | POA: Diagnosis not present

## 2021-12-31 LAB — POCT URINALYSIS DIP (MANUAL ENTRY)
Bilirubin, UA: NEGATIVE
Blood, UA: NEGATIVE
Glucose, UA: NEGATIVE mg/dL
Ketones, POC UA: NEGATIVE mg/dL
Leukocytes, UA: NEGATIVE
Nitrite, UA: NEGATIVE
Protein Ur, POC: NEGATIVE mg/dL
Spec Grav, UA: <=1.005 — AB (ref 1.010–1.025)
Urobilinogen, UA: 0.2 E.U./dL
pH, UA: 6 (ref 5.0–8.0)

## 2021-12-31 MED ORDER — METRONIDAZOLE 500 MG PO TABS: 500.0000 mg | ORAL_TABLET | Freq: Two times a day (BID) | ORAL | 0 refills | Status: DC

## 2021-12-31 MED ORDER — FLUCONAZOLE 150 MG PO TABS: 150.0000 mg | ORAL_TABLET | Freq: Once | ORAL | 0 refills | Status: AC

## 2021-12-31 NOTE — Progress Notes (Signed)
Subjective:  Patient ID: Rebecca [name], female    DOB: 02-Apr-1986  Age: 36 y.o. MRN: [id number]  CC: UTI  HPI Rebecca [name] is a 36 y.o. female presents to the clinic today with concern for UTI.  Patient reports a 3 to 4-day history of urinary frequency.  She is also had a "stringy" vaginal discharge.  Associated bilateral low back pain.  No dysuria.  Denies concerns for STD.  No fever.  No flank pain.  No abdominal pain.  No relieving factors.  No other complaints or concerns at this time.  PMH, Surgical Hx, Family Hx, Social History reviewed and updated as below.  Past Medical History:  Diagnosis Date   Anxiety    Back pain    GERD (gastroesophageal reflux disease)    Hyperlipidemia    Smoker 05/14/2016   Past Surgical History:  Procedure Laterality Date   NO PAST SURGERIES     Family History  Problem Relation Age of Onset   Cancer Maternal Grandmother        lung   Cancer Maternal Grandfather        unsure    CAD Father    Stroke Father    Liver disease Father    Alcohol abuse Father    Other Mother        brain tumor   Anxiety disorder Sister    Asthma Son    Bronchitis Son    ADD / ADHD Son    Other Daughter        acid reflux   Social History   Tobacco Use   Smoking status: Every Day    Packs/day: 0.50    Years: 15.00    Pack years: 7.50    Types: Cigarettes   Smokeless tobacco: Never  Substance Use Topics   Alcohol use: No    Review of Systems Per HPI  Objective:   Today's Vitals: BP 112/78    Pulse 90    Temp 98.2 F (36.8 C)    Ht 5\' 1"  (1.549 m)    Wt 138 lb (62.6 kg)    SpO2 100%    BMI 26.07 kg/m   Physical Exam Vitals and nursing note reviewed.  Constitutional:      General: She is not in acute distress.    Appearance: Normal appearance. She is not ill-appearing.  HENT:     Head: Normocephalic and atraumatic.  Eyes:     General:        Right eye: No discharge.        Left eye: No discharge.     Conjunctiva/sclera:  Conjunctivae normal.  Cardiovascular:     Rate and Rhythm: Normal rate and regular rhythm.  Pulmonary:     Effort: Pulmonary effort is normal.     Breath sounds: Normal breath sounds. No wheezing, rhonchi or rales.  Abdominal:     General: There is no distension.     Palpations: Abdomen is soft.     Tenderness: There is no abdominal tenderness. There is no right CVA tenderness or left CVA tenderness.  Neurological:     Mental Status: She is alert.  Psychiatric:        Mood and Affect: Mood normal.        Behavior: Behavior normal.     Assessment & Plan:   Problem List Items Addressed This Visit       Other   Urinary frequency - Primary    UA clear.  Relevant Orders   POCT urinalysis dipstick (Completed)   Vaginal discharge    Patient is being seen for urinary symptoms and vaginal discharge.  Suspect bacterial vaginosis.  Treating with Flagyl.  Diflucan to be used if needed.       Meds ordered this encounter  Medications   metroNIDAZOLE (FLAGYL) 500 MG tablet    Sig: Take 1 tablet (500 mg total) by mouth 2 (two) times daily.    Dispense:  14 tablet    Refill:  0   fluconazole (DIFLUCAN) 150 MG tablet    Sig: Take 1 tablet (150 mg total) by mouth once for 1 dose. Repeat dose in 72 hours.    Dispense:  2 tablet    Refill:  0  \ Nekia Maxham DO [facility]

## 2021-12-31 NOTE — Assessment & Plan Note (Signed)
UA clear.

## 2021-12-31 NOTE — Patient Instructions (Signed)
Medication as prescribed.  Diflucan if needed.  Take care  Dr. Lacinda Axon

## 2021-12-31 NOTE — Assessment & Plan Note (Signed)
Patient is being seen for urinary symptoms and vaginal discharge.  Suspect bacterial vaginosis.  Treating with Flagyl.  Diflucan to be used if needed.

## 2022-02-11 ENCOUNTER — Other Ambulatory Visit: Payer: Self-pay | Admitting: Adult Health

## 2022-03-13 ENCOUNTER — Other Ambulatory Visit: Payer: Self-pay | Admitting: Adult Health

## 2022-05-07 ENCOUNTER — Other Ambulatory Visit: Payer: Self-pay | Admitting: *Deleted

## 2022-05-08 MED ORDER — SIMVASTATIN 20 MG PO TABS: ORAL_TABLET | ORAL | 3 refills | Status: DC

## 2022-05-29 ENCOUNTER — Encounter: Payer: Self-pay | Admitting: Family Medicine

## 2022-05-29 ENCOUNTER — Ambulatory Visit: Payer: Medicaid Other | Admitting: Family Medicine

## 2022-05-29 VITALS — BP 116/62 | HR 75 | Temp 98.0°F | Wt 141.8 lb

## 2022-05-29 DIAGNOSIS — N3281 Overactive bladder: Secondary | ICD-10-CM | POA: Diagnosis not present

## 2022-05-29 DIAGNOSIS — R350 Frequency of micturition: Secondary | ICD-10-CM

## 2022-05-29 LAB — POCT URINALYSIS DIP (CLINITEK)
Spec Grav, UA: 1.01 (ref 1.010–1.025)
pH, UA: 6 (ref 5.0–8.0)

## 2022-05-29 MED ORDER — SOLIFENACIN SUCCINATE 5 MG PO TABS: 5.0000 mg | ORAL_TABLET | Freq: Every day | ORAL | 0 refills | Status: DC

## 2022-05-29 MED ORDER — MELOXICAM 15 MG PO TABS: 15.0000 mg | ORAL_TABLET | Freq: Every day | ORAL | 3 refills | Status: DC | PRN

## 2022-05-29 NOTE — Progress Notes (Signed)
Subjective:  Patient ID: Rebecca [name], female    DOB: 10-Apr-1986  Age: 36 y.o. MRN: [id number]  CC: Chief Complaint  Patient presents with   Urinary Tract Infection    Pt arrives with frequency, foul odor and back pain. Began about 2 days ago.     HPI:  36 year old female presents for evaluation of the above.  Patient reports a 2-day history of symptoms.  She has a history of overactive bladder.  She states that she is having urinary frequency and she is going to the bathroom very frequently.  She has urgency.  She states that her urine smells foul.  She reports that she is having low back pain as well.  Denies vaginal discharge.  No fever.  She states that she was previously on oxybutynin for overactive bladder but it did not work particularly well.  No relieving factors.  No other associated symptoms.  No other complaints.  Patient Active Problem List   Diagnosis Date Noted   Irritable bowel syndrome with both constipation and diarrhea 08/20/2021   OAB (overactive bladder) 04/25/2020   Nexplanon insertion 02/25/2020   Dyslipidemia (high LDL; low HDL) 09/17/2019   Smoker 05/14/2016   Depression with anxiety 04/05/2013    Social Hx   Social History   Socioeconomic History   Marital status: Single    Spouse name: Not on file   Number of children: 3   Years of education: Not on file   Highest education level: 9th grade  Occupational History   Not on file  Tobacco Use   Smoking status: Every Day    Packs/day: 0.50    Years: 15.00    Total pack years: 7.50    Types: Cigarettes   Smokeless tobacco: Never  Vaping Use   Vaping Use: Never used  Substance and Sexual Activity   Alcohol use: No   Drug use: Never    Types: Hydrocodone   Sexual activity: Yes    Birth control/protection: Implant  Other Topics Concern   Not on file  Social History Narrative   Not on file   Social Determinants of Health   Financial Resource Strain: Low Risk  (08/20/2021)   Overall  Financial Resource Strain (CARDIA)    Difficulty of Paying Living Expenses: Not very hard  Food Insecurity: No Food Insecurity (08/20/2021)   Hunger Vital Sign    Worried About Running Out of Food in the Last Year: Never true    Ran Out of Food in the Last Year: Never true  Transportation Needs: No Transportation Needs (08/20/2021)   PRAPARE - Administrator, Civil Service (Medical): No    Lack of Transportation (Non-Medical): No  Physical Activity: Insufficiently Active (08/20/2021)   Exercise Vital Sign    Days of Exercise per Week: 3 days    Minutes of Exercise per Session: 30 min  Stress: No Stress Concern Present (08/20/2021)   [employer] of Occupational Health - Occupational Stress Questionnaire    Feeling of Stress : Only a little  Social Connections: Moderately Isolated (08/20/2021)   Social Connection and Isolation Panel [NHANES]    Frequency of Communication with Friends and Family: Twice a week    Frequency of Social Gatherings with Friends and Family: Twice a week    Attends Religious Services: 1 to 4 times per year    Active Member of [employer] or Organizations: No    Attends Banker Meetings: Never    Marital Status: Divorced  Review of Systems Per HPI  Objective:  BP 116/62   Pulse 75   Temp 98 F (36.7 C)   Wt 141 lb 12.8 oz (64.3 kg)   SpO2 99%   BMI 26.79 kg/m      05/29/2022    1:13 PM 12/31/2021   10:03 AM 10/06/2021    9:13 AM  BP/Weight  Systolic BP 116 112 117  Diastolic BP 62 78 80  Wt. (Lbs) 141.8 138   BMI 26.79 kg/m2 26.07 kg/m2     Physical Exam Vitals and nursing note reviewed.  Constitutional:      General: She is not in acute distress.    Appearance: Normal appearance. She is not ill-appearing.  HENT:     Head: Normocephalic and atraumatic.  Cardiovascular:     Rate and Rhythm: Normal rate and regular rhythm.  Pulmonary:     Effort: Pulmonary effort is normal.     Breath sounds: Normal breath sounds. No  wheezing, rhonchi or rales.  Abdominal:     General: There is no distension.     Palpations: Abdomen is soft.     Tenderness: There is no abdominal tenderness.  Neurological:     Mental Status: She is alert.     Lab Results  Component Value Date   WBC 9.6 11/10/2020   HGB 14.9 11/10/2020   HCT 45.0 11/10/2020   PLT 204 11/10/2020   GLUCOSE 66 04/19/2021   CHOL 152 04/19/2021   TRIG 111 04/19/2021   HDL 30 (L) 04/19/2021   LDLCALC 101 (H) 04/19/2021   ALT 26 04/19/2021   AST 16 04/19/2021   NA 143 04/19/2021   K 4.0 04/19/2021   CL 106 04/19/2021   CREATININE 0.82 04/19/2021   BUN 6 04/19/2021   CO2 18 (L) 04/19/2021   TSH 1.230 05/11/2015     Assessment & Plan:   Problem List Items Addressed This Visit       Genitourinary   OAB (overactive bladder) - Primary    Patient's urinalysis was clear today.  I feel that this is secondary to overactive bladder.  Trial of Vesicare. I am treating patient's back pain which I suspect is musculoskeletal with meloxicam.      Other Visit Diagnoses     Frequency of urination       Relevant Orders   POCT URINALYSIS DIP (CLINITEK) (Completed)   Urine Culture       Meds ordered this encounter  Medications   solifenacin (VESICARE) 5 MG tablet    Sig: Take 1 tablet (5 mg total) by mouth daily.    Dispense:  90 tablet    Refill:  0   meloxicam (MOBIC) 15 MG tablet    Sig: Take 1 tablet (15 mg total) by mouth daily as needed for pain.    Dispense:  30 tablet    Refill:  3   Kaide Gage DO [facility]

## 2022-05-29 NOTE — Patient Instructions (Signed)
Medication as prescribed.  Send a message and let me know how things are going. If symptoms persist, I will send you to Urology.  Take care  Dr. Adriana Simas

## 2022-05-29 NOTE — Assessment & Plan Note (Signed)
Patient's urinalysis was clear today.  I feel that this is secondary to overactive bladder.  Trial of Vesicare. I am treating patient's back pain which I suspect is musculoskeletal with meloxicam.

## 2022-05-31 LAB — URINE CULTURE: Organism ID, Bacteria: NO GROWTH

## 2022-06-14 ENCOUNTER — Ambulatory Visit
Admission: EM | Admit: 2022-06-14 | Discharge: 2022-06-14 | Disposition: A | Discharge status: Home / Self Care | Payer: Medicaid Other | Attending: Family Medicine | Admitting: Family Medicine

## 2022-06-14 ENCOUNTER — Encounter: Payer: Self-pay | Admitting: Emergency Medicine

## 2022-06-14 DIAGNOSIS — J329 Chronic sinusitis, unspecified: Secondary | ICD-10-CM | POA: Diagnosis not present

## 2022-06-14 DIAGNOSIS — R051 Acute cough: Secondary | ICD-10-CM

## 2022-06-14 DIAGNOSIS — B9789 Other viral agents as the cause of diseases classified elsewhere: Secondary | ICD-10-CM | POA: Diagnosis not present

## 2022-06-14 DIAGNOSIS — R062 Wheezing: Secondary | ICD-10-CM | POA: Diagnosis not present

## 2022-06-14 MED ORDER — ALBUTEROL SULFATE HFA 108 (90 BASE) MCG/ACT IN AERS: 1.0000 | INHALATION_SPRAY | Freq: Four times a day (QID) | RESPIRATORY_TRACT | 0 refills | Status: DC | PRN

## 2022-06-14 MED ORDER — DEXAMETHASONE SODIUM PHOSPHATE 10 MG/ML IJ SOLN
10.0000 mg | Freq: Once | INTRAMUSCULAR | Status: AC
  Administered 2022-06-14: 10 mg via INTRAMUSCULAR

## 2022-06-14 NOTE — ED Triage Notes (Signed)
Facial pressure, bilateral ears popping, nasal congestion x 2 days.

## 2022-06-14 NOTE — ED Provider Notes (Signed)
[facility]    CSN: [id number] Arrival date & time: 06/14/22  1830      History   Chief Complaint Chief Complaint  Patient presents with   Nasal Congestion    Popping in ears a lot of pressure in ears and face hurting in ears - Entered by patient    HPI Rebecca [name] is a 36 y.o. female.   Presenting today with 2-day history of sinus pressure, nasal congestion, ear pressure and popping, scratchy throat, cough, wheezing, chest tightness.  Denies fever, chills, chest pain, shortness of breath, abdominal pain, nausea vomiting or diarrhea.  So far trying Alka-Seltzer cold and flu, Mucinex DM with minimal relief.  No known sick contacts recently.  No known history of pertinent chronic medical problems.  Current smoker.    Past Medical History:  Diagnosis Date   Anxiety    Back pain    GERD (gastroesophageal reflux disease)    Hyperlipidemia    Smoker 05/14/2016    Patient Active Problem List   Diagnosis Date Noted   Irritable bowel syndrome with both constipation and diarrhea 08/20/2021   OAB (overactive bladder) 04/25/2020   Nexplanon insertion 02/25/2020   Dyslipidemia (high LDL; low HDL) 09/17/2019   Smoker 05/14/2016   Depression with anxiety 04/05/2013    Past Surgical History:  Procedure Laterality Date   NO PAST SURGERIES      OB History     Gravida  3   Para  3   Term  2   Preterm  1   AB      Living  3      SAB      IAB      Ectopic      Multiple      Live Births  3            Home Medications    Prior to Admission medications   Medication Sig Start Date End Date Taking? Authorizing Provider  albuterol (VENTOLIN HFA) 108 (90 Base) MCG/ACT inhaler Inhale 1-2 puffs into the lungs every 6 (six) hours as needed for wheezing or shortness of breath. 06/14/22  Yes Particia Nearing, PA-C  etonogestrel (NEXPLANON) 68 MG IMPL implant 1 each by Subdermal route once.    [provider]  meloxicam (MOBIC) 15 MG  tablet Take 1 tablet (15 mg total) by mouth daily as needed for pain. 05/29/22   Tommie Sams, DO  omeprazole (PRILOSEC) 20 MG capsule TAKE 1 CAPSULE BY MOUTH DAILY 03/13/22   Cyril Mourning A, NP  simvastatin (ZOCOR) 20 MG tablet Take 1 tablet by mouth daily. 05/08/22   Adline Potter, NP  solifenacin (VESICARE) 5 MG tablet Take 1 tablet (5 mg total) by mouth daily. 05/29/22   Tommie Sams, DO    Family History Family History  Problem Relation Age of Onset   Cancer Maternal Grandmother        lung   Cancer Maternal Grandfather        unsure    CAD Father    Stroke Father    Liver disease Father    Alcohol abuse Father    Other Mother        brain tumor   Anxiety disorder Sister    Asthma Son    Bronchitis Son    ADD / ADHD Son    Other Daughter        acid reflux    Social History Social History  Tobacco Use   Smoking status: Every Day    Packs/day: 0.50    Years: 15.00    Total pack years: 7.50    Types: Cigarettes   Smokeless tobacco: Never  Vaping Use   Vaping Use: Never used  Substance Use Topics   Alcohol use: No   Drug use: Never    Types: Hydrocodone     Allergies   Amoxicillin, Cephalosporins, Septra [bactrim], Ciprocinonide [fluocinolone], and Penicillins   Review of Systems Review of Systems Per HPI  Physical Exam Triage Vital Signs ED Triage Vitals [06/14/22 1838]  Enc Vitals Group     BP 127/86     Pulse Rate 82     Resp 18     Temp 98.8 F (37.1 C)     Temp Source Oral     SpO2 99 %     Weight      Height      Head Circumference      Peak Flow      Pain Score 7     Pain Loc      Pain Edu?      Excl. in [place]?    No data found.  Updated Vital Signs BP 127/86 (BP Location: Right Arm)   Pulse 82   Temp 98.8 F (37.1 C) (Oral)   Resp 18   SpO2 99%   Visual Acuity Right Eye Distance:   Left Eye Distance:   Bilateral Distance:    Right Eye Near:   Left Eye Near:    Bilateral Near:     Physical Exam Vitals and  nursing note reviewed.  Constitutional:      Appearance: Normal appearance. She is not ill-appearing.  HENT:     Head: Atraumatic.     Ears:     Comments: Mild bilateral middle ear effusions    Nose: Rhinorrhea present.     Mouth/Throat:     Mouth: Mucous membranes are moist.     Pharynx: Posterior oropharyngeal erythema present. No oropharyngeal exudate.  Eyes:     Extraocular Movements: Extraocular movements intact.     Conjunctiva/sclera: Conjunctivae normal.  Cardiovascular:     Rate and Rhythm: Normal rate and regular rhythm.     Heart sounds: Normal heart sounds.  Pulmonary:     Effort: Pulmonary effort is normal.     Breath sounds: Wheezing present.     Comments: Moderate wheezes bilaterally Musculoskeletal:        General: Normal range of motion.     Cervical back: Normal range of motion and neck supple.  Lymphadenopathy:     Cervical: No cervical adenopathy.  Skin:    General: Skin is warm and dry.  Neurological:     Mental Status: She is alert and oriented to person, place, and time.  Psychiatric:        Mood and Affect: Mood normal.        Thought Content: Thought content normal.        Judgment: Judgment normal.      UC Treatments / Results  Labs (all labs ordered are listed, but only abnormal results are displayed) Labs Reviewed - No data to display  EKG   Radiology No results found.  Procedures Procedures (including critical care time)  Medications Ordered in UC Medications  dexamethasone (DECADRON) injection 10 mg (10 mg Intramuscular Given 06/14/22 1903)    Initial Impression / Assessment and Plan / UC Course  I have reviewed the triage vital signs and  the nursing notes.  Pertinent labs & imaging results that were available during my care of the patient were reviewed by me and considered in my medical decision making (see chart for details).     Suspect viral upper respiratory infection causing some bronchitis.  Declines COVID and flu  testing today, treat with IM Decadron as she does not tolerate oral steroids but does tolerate injectable steroids, albuterol inhaler, continued over-the-counter cold and congestion medications.  Return for worsening symptoms.  Final Clinical Impressions(s) / UC Diagnoses   Final diagnoses:  Viral sinusitis  Wheezing  Acute cough   Discharge Instructions   None    ED Prescriptions     Medication Sig Dispense Auth. Provider   albuterol (VENTOLIN HFA) 108 (90 Base) MCG/ACT inhaler Inhale 1-2 puffs into the lungs every 6 (six) hours as needed for wheezing or shortness of breath. 18 g Particia Nearing, [place]      PDMP not reviewed this encounter.   Particia Nearing, [place] 06/14/22 1936

## 2022-07-29 ENCOUNTER — Other Ambulatory Visit: Payer: Self-pay | Admitting: Family Medicine

## 2022-07-29 ENCOUNTER — Encounter: Payer: Self-pay | Admitting: Family Medicine

## 2022-07-29 MED ORDER — DOXYCYCLINE HYCLATE 100 MG PO TABS: 100.0000 mg | ORAL_TABLET | Freq: Two times a day (BID) | ORAL | 0 refills | Status: DC

## 2022-08-28 ENCOUNTER — Ambulatory Visit
Admission: RE | Admit: 2022-08-28 | Discharge: 2022-08-28 | Disposition: A | Discharge status: Home / Self Care | Payer: Medicaid Other | Source: Ambulatory Visit | Attending: Family Medicine | Admitting: Family Medicine

## 2022-08-28 VITALS — BP 132/81 | HR 93 | Temp 98.5°F | Resp 18

## 2022-08-28 DIAGNOSIS — J069 Acute upper respiratory infection, unspecified: Secondary | ICD-10-CM | POA: Insufficient documentation

## 2022-08-28 DIAGNOSIS — R059 Cough, unspecified: Secondary | ICD-10-CM | POA: Diagnosis not present

## 2022-08-28 DIAGNOSIS — Z1152 Encounter for screening for COVID-19: Secondary | ICD-10-CM | POA: Insufficient documentation

## 2022-08-28 DIAGNOSIS — Z79899 Other long term (current) drug therapy: Secondary | ICD-10-CM | POA: Diagnosis not present

## 2022-08-28 LAB — RESP PANEL BY RT-PCR (FLU A&B, COVID) ARPGX2
Influenza A by PCR: NEGATIVE
Influenza B by PCR: NEGATIVE
SARS Coronavirus 2 by RT PCR: NEGATIVE

## 2022-08-28 MED ORDER — PROMETHAZINE-DM 6.25-15 MG/5ML PO SYRP: 5.0000 mL | ORAL_SOLUTION | Freq: Four times a day (QID) | ORAL | 0 refills | Status: DC | PRN

## 2022-08-28 NOTE — ED Provider Notes (Signed)
[facility]    CSN: [id number] Arrival date & time: 08/28/22  1437      History   Chief Complaint Chief Complaint  Patient presents with   Appointment    1500 Head cold and congested and feel like I have no energy - Entered by patient   Nasal Congestion        Cough    HPI Rebecca [name] is a 36 y.o. female.   Presenting today with 1 day history of sinus pressure, cough, nasal congestion, fatigue, headache.  Denies known fever, body aches, chest pain, shortness of breath, abdominal pain, nausea vomiting or diarrhea.  So far trying Alka-Seltzer plus with mild temporary relief of symptoms.  States kids were sick with similar symptoms last week.  No known history of chronic pulmonary disease.    Past Medical History:  Diagnosis Date   Anxiety    Back pain    GERD (gastroesophageal reflux disease)    Hyperlipidemia    Smoker 05/14/2016    Patient Active Problem List   Diagnosis Date Noted   Irritable bowel syndrome with both constipation and diarrhea 08/20/2021   OAB (overactive bladder) 04/25/2020   Nexplanon insertion 02/25/2020   Dyslipidemia (high LDL; low HDL) 09/17/2019   Smoker 05/14/2016   Depression with anxiety 04/05/2013    Past Surgical History:  Procedure Laterality Date   NO PAST SURGERIES      OB History     Gravida  3   Para  3   Term  2   Preterm  1   AB      Living  3      SAB      IAB      Ectopic      Multiple      Live Births  3            Home Medications    Prior to Admission medications   Medication Sig Start Date End Date Taking? Authorizing Provider  Chlorphen-Phenyleph-ASA (ALKA-SELTZER PLUS COLD PO) Take by mouth.   Yes [provider]  promethazine-dextromethorphan (PROMETHAZINE-DM) 6.25-15 MG/5ML syrup Take 5 mLs by mouth 4 (four) times daily as needed. 08/28/22  Yes Volney American, PA-C  albuterol (VENTOLIN HFA) 108 (90 Base) MCG/ACT inhaler Inhale 1-2 puffs into the  lungs every 6 (six) hours as needed for wheezing or shortness of breath. 06/14/22   Volney American, PA-C  doxycycline (VIBRA-TABS) 100 MG tablet Take 1 tablet (100 mg total) by mouth 2 (two) times daily. 07/29/22   Coral Spikes, DO  etonogestrel (NEXPLANON) 68 MG IMPL implant 1 each by Subdermal route once.    [provider]  meloxicam (MOBIC) 15 MG tablet Take 1 tablet (15 mg total) by mouth daily as needed for pain. 05/29/22   Coral Spikes, DO  omeprazole (PRILOSEC) 20 MG capsule TAKE 1 CAPSULE BY MOUTH DAILY 03/13/22   Derrek Monaco A, NP  simvastatin (ZOCOR) 20 MG tablet Take 1 tablet by mouth daily. 05/08/22   Estill Dooms, NP  solifenacin (VESICARE) 5 MG tablet Take 1 tablet (5 mg total) by mouth daily. 05/29/22   Coral Spikes, DO    Family History Family History  Problem Relation Age of Onset   Cancer Maternal Grandmother        lung   Cancer Maternal Grandfather        unsure    CAD Father    Stroke Father  Liver disease Father    Alcohol abuse Father    Other Mother        brain tumor   Anxiety disorder Sister    Asthma Son    Bronchitis Son    ADD / ADHD Son    Other Daughter        acid reflux    Social History Social History   Tobacco Use   Smoking status: Every Day    Packs/day: 0.50    Years: 15.00    Total pack years: 7.50    Types: Cigarettes   Smokeless tobacco: Never  Vaping Use   Vaping Use: Never used  Substance Use Topics   Alcohol use: No   Drug use: Never    Types: Hydrocodone     Allergies   Amoxicillin, Cephalosporins, Septra [bactrim], Ciprocinonide [fluocinolone], and Penicillins   Review of Systems Review of Systems Per HPI  Physical Exam Triage Vital Signs ED Triage Vitals  Enc Vitals Group     BP 08/28/22 1455 132/81     Pulse Rate 08/28/22 1455 93     Resp 08/28/22 1455 18     Temp 08/28/22 1455 98.5 F (36.9 C)     Temp Source 08/28/22 1455 Oral     SpO2 08/28/22 1455 99 %     Weight --       Height --      Head Circumference --      Peak Flow --      Pain Score 08/28/22 1453 9     Pain Loc --      Pain Edu? --      Excl. in [place]? --    No data found.  Updated Vital Signs BP 132/81 (BP Location: Right Arm)   Pulse 93   Temp 98.5 F (36.9 C) (Oral)   Resp 18   SpO2 99%   Visual Acuity Right Eye Distance:   Left Eye Distance:   Bilateral Distance:    Right Eye Near:   Left Eye Near:    Bilateral Near:     Physical Exam Vitals and nursing note reviewed.  Constitutional:      Appearance: Normal appearance.  HENT:     Head: Atraumatic.     Right Ear: Tympanic membrane and external ear normal.     Left Ear: Tympanic membrane and external ear normal.     Nose: Rhinorrhea present.     Mouth/Throat:     Mouth: Mucous membranes are moist.     Pharynx: Posterior oropharyngeal erythema present.  Eyes:     Extraocular Movements: Extraocular movements intact.     Conjunctiva/sclera: Conjunctivae normal.  Cardiovascular:     Rate and Rhythm: Normal rate and regular rhythm.     Heart sounds: Normal heart sounds.  Pulmonary:     Effort: Pulmonary effort is normal.     Breath sounds: Normal breath sounds. No wheezing or rales.  Musculoskeletal:        General: Normal range of motion.     Cervical back: Normal range of motion and neck supple.  Skin:    General: Skin is warm and dry.  Neurological:     Mental Status: She is alert and oriented to person, place, and time.  Psychiatric:        Mood and Affect: Mood normal.        Thought Content: Thought content normal.      UC Treatments / Results  Labs (all labs ordered are  listed, but only abnormal results are displayed) Labs Reviewed  RESP PANEL BY RT-PCR (FLU A&B, COVID) [id number]    EKG   Radiology No results found.  Procedures Procedures (including critical care time)  Medications Ordered in UC Medications - No data to display  Initial Impression / Assessment and Plan / UC Course  I have  reviewed the triage vital signs and the nursing notes.  Pertinent labs & imaging results that were available during my care of the patient were reviewed by me and considered in my medical decision making (see chart for details).     Vitals and exam reassuring and suspicious for viral upper respiratory infection.  Respiratory panel pending, treat with Phenergan DM, supportive over-the-counter medications and home care.  Return for any worsening symptoms.  Final Clinical Impressions(s) / UC Diagnoses   Final diagnoses:  Viral URI with cough   Discharge Instructions   None    ED Prescriptions     Medication Sig Dispense Auth. Provider   promethazine-dextromethorphan (PROMETHAZINE-DM) 6.25-15 MG/5ML syrup Take 5 mLs by mouth 4 (four) times daily as needed. 100 mL Volney American, [place]      PDMP not reviewed this encounter.   Volney American, [place] 08/28/22 1512

## 2022-08-28 NOTE — ED Triage Notes (Signed)
Pt reports sinus pressure, nasal congestion and low energy x 1 day. Alka Seltzer Plus gives some relief.

## 2022-09-03 ENCOUNTER — Encounter: Payer: Self-pay | Admitting: Nurse Practitioner

## 2022-09-03 ENCOUNTER — Ambulatory Visit: Payer: Medicaid Other | Admitting: Nurse Practitioner

## 2022-09-03 VITALS — BP 132/82 | Ht 61.0 in | Wt 141.2 lb

## 2022-09-03 DIAGNOSIS — Z720 Tobacco use: Secondary | ICD-10-CM

## 2022-09-03 DIAGNOSIS — L732 Hidradenitis suppurativa: Secondary | ICD-10-CM | POA: Diagnosis not present

## 2022-09-03 MED ORDER — NICOTINE 14 MG/24HR TD PT24: 14.0000 mg | MEDICATED_PATCH | Freq: Every day | TRANSDERMAL | 0 refills | Status: DC

## 2022-09-03 MED ORDER — DOXYCYCLINE HYCLATE 100 MG PO TABS: 100.0000 mg | ORAL_TABLET | Freq: Two times a day (BID) | ORAL | 0 refills | Status: DC

## 2022-09-03 NOTE — Progress Notes (Addendum)
   Subjective:    Patient ID: Rebecca [name], female    DOB: Sep 20, 1986, 36 y.o.   MRN: [id number]  HPI  Patient states when she shaves in her private area she gets bad bumps or boils that are painful and usually require antibiotic to go away- currently has 4. Was recently treated with doxycycline in which the bumps got better but returned after she shaved.  Patient denies any fevers, body aches, chills, or discharge for lesions.   Review of Systems  Skin:        4 bumps  All other systems reviewed and are negative.      Objective:   Physical Exam Vitals reviewed.  Constitutional:      General: She is not in acute distress.    Appearance: Normal appearance. She is normal weight. She is not ill-appearing, toxic-appearing or diaphoretic.  HENT:     Head: Normocephalic and atraumatic.  Genitourinary:    Comments: 2 small pustules noted to left side of patient's perineal area and another 2 small pustules noted to right side of patient's perineal area. No fluctuance noted to any of the areas. Mild erythema localized near the pustules. No drainage noted.  Musculoskeletal:     Comments: Grossly intact  Skin:    General: Skin is warm.  Neurological:     Mental Status: She is alert.     Comments: Grossly intact  Psychiatric:        Mood and Affect: Mood normal.        Behavior: Behavior normal.        Assessment & Plan:   1. Hidradenitis suppurativa - HS vs boil - Will retreat with Doxycycline  - doxycycline (VIBRA-TABS) 100 MG tablet; Take 1 tablet (100 mg total) by mouth 2 (two) times daily.  Dispense: 14 tablet; Refill: 0 - Use warm compresses to the areas - nicotine (NICODERM CQ - DOSED IN MG/24 HOURS) 14 mg/24hr patch; Place 1 patch (14 mg total) onto the skin daily.  Dispense: 28 patch; Refill: 0 - Encouraged smoking cessation to decrease HS occurences - Also encourage patient to use Trimmer vs shaving to prevent skin infections. Patient stated understanding. -  Return to clinic if symptoms resolve.   2. Tobacco use -Will trial patient on  - nicotine (NICODERM CQ - DOSED IN MG/24 HOURS) 14 mg/24hr patch; Place 1 patch (14 mg total) onto the skin daily.  Dispense: 28 patch; Refill: 0

## 2022-09-04 ENCOUNTER — Other Ambulatory Visit: Payer: Self-pay | Admitting: Family Medicine

## 2022-10-06 ENCOUNTER — Other Ambulatory Visit: Payer: Self-pay | Admitting: Family Medicine

## 2022-10-08 ENCOUNTER — Ambulatory Visit: Payer: Medicaid Other | Admitting: Nurse Practitioner

## 2022-10-08 ENCOUNTER — Encounter: Payer: Self-pay | Admitting: Family Medicine

## 2022-10-08 ENCOUNTER — Encounter: Payer: Self-pay | Admitting: Nurse Practitioner

## 2022-10-08 VITALS — BP 128/70 | Wt 141.2 lb

## 2022-10-08 DIAGNOSIS — L732 Hidradenitis suppurativa: Secondary | ICD-10-CM

## 2022-10-08 DIAGNOSIS — L723 Sebaceous cyst: Secondary | ICD-10-CM

## 2022-10-08 MED ORDER — DOXYCYCLINE HYCLATE 100 MG PO TABS: 100.0000 mg | ORAL_TABLET | Freq: Two times a day (BID) | ORAL | 0 refills | Status: AC

## 2022-10-08 MED ORDER — IBUPROFEN 600 MG PO TABS: 600.0000 mg | ORAL_TABLET | Freq: Three times a day (TID) | ORAL | 0 refills | Status: DC | PRN

## 2022-10-08 NOTE — Progress Notes (Signed)
   Subjective:    Patient ID: Rebecca [name], female    DOB: 1986-08-24, 36 y.o.   MRN: [id number]  HPI Pt arrives due to mass on right side of neck. Pt states its been for years but never really bothered her. Began to bother her about 3 days ago. Pt feels it is getting bigger, red, sore to touch and swollen.    Review of Systems     Objective:   Physical Exam        Assessment & Plan:

## 2022-10-10 ENCOUNTER — Encounter: Payer: Self-pay | Admitting: Nurse Practitioner

## 2022-12-16 ENCOUNTER — Other Ambulatory Visit: Payer: Self-pay | Admitting: Family Medicine

## 2022-12-17 ENCOUNTER — Ambulatory Visit
Admission: RE | Admit: 2022-12-17 | Discharge: 2022-12-17 | Disposition: A | Discharge status: Home / Self Care | Payer: Medicaid Other | Source: Ambulatory Visit | Attending: Nurse Practitioner | Admitting: Nurse Practitioner

## 2022-12-17 ENCOUNTER — Ambulatory Visit: Payer: Medicaid Other | Admitting: Family Medicine

## 2022-12-17 VITALS — BP 114/73 | HR 112 | Temp 99.7°F | Resp 18

## 2022-12-17 DIAGNOSIS — H66001 Acute suppurative otitis media without spontaneous rupture of ear drum, right ear: Secondary | ICD-10-CM | POA: Diagnosis not present

## 2022-12-17 DIAGNOSIS — Z1152 Encounter for screening for COVID-19: Secondary | ICD-10-CM

## 2022-12-17 LAB — POCT URINALYSIS DIP (MANUAL ENTRY)
Bilirubin, UA: NEGATIVE
Blood, UA: NEGATIVE
Glucose, UA: NEGATIVE mg/dL
Ketones, POC UA: NEGATIVE mg/dL
Leukocytes, UA: NEGATIVE
Nitrite, UA: NEGATIVE
Protein Ur, POC: NEGATIVE mg/dL
Spec Grav, UA: <=1.005 — AB (ref 1.010–1.025)
Urobilinogen, UA: 0.2 E.U./dL
pH, UA: 5.5 (ref 5.0–8.0)

## 2022-12-17 MED ORDER — AZITHROMYCIN 250 MG PO TABS: ORAL_TABLET | ORAL | 0 refills | Status: DC

## 2022-12-17 MED ORDER — IBUPROFEN 800 MG PO TABS: 800.0000 mg | ORAL_TABLET | Freq: Three times a day (TID) | ORAL | 0 refills | Status: DC | PRN

## 2022-12-17 MED ORDER — FLUTICASONE PROPIONATE 50 MCG/ACT NA SUSP: 1.0000 | Freq: Every day | NASAL | 0 refills | Status: DC

## 2022-12-17 NOTE — Discharge Instructions (Addendum)
You have an ear infection today which may explain your symptoms.  Please take the azithromycin to treat this.  Please also start on Flonase nasal spray to help with the fluid behind your right ear.  We have tested you for COVID-19 today to be sure this is not attributing to your symptoms.  You will see the results on MyChart tomorrow morning.  Please isolate at home until you are aware of the results.  You can take ibuprofen 800 mg every 8 hours alternating with Tylenol 500 to 1000 mg every 6 hours as needed for fever or pain.  You can also take Mucinex to help with congestion.  The Flonase will help with the postnasal drainage.  Seek care if your symptoms persist or worsen despite treatment.

## 2022-12-17 NOTE — ED Triage Notes (Signed)
Chills since 1am.  Body aches especially across lower back.  C/o pressure behind eyes.  States she has been urinating frequently.

## 2022-12-17 NOTE — ED Provider Notes (Signed)
[facility]    CSN: [id number] Arrival date & time: 12/17/22  1308      History   Chief Complaint Chief Complaint  Patient presents with   Chills    I'm having body cramps and chills no fever . - Entered by patient    HPI LAMYIA CDEBACA is a 37 y.o. female.   Patient presents today for 1 day history of chills, body aches across her low back, and her knees, and lower extremities, postnasal drainage, sinus pressure in her cheeks and under her eyes, and fatigue.  She denies fever at home, cough, shortness of breath or chest pain, nasal congestion, runny nose, sore throat, headache, ear pain or drainage, abdominal pain, nausea/vomiting, diarrhea, decreased appetite, and new rash.  No known sick contacts.  Has been taking ibuprofen 400 mg which has not helped with the body aches.  Patient denies history of chronic lung disease.  She is a current smoker.  Reports she has not taken meloxicam and knows to not take it when she takes ibuprofen.     Past Medical History:  Diagnosis Date   Anxiety    Back pain    GERD (gastroesophageal reflux disease)    Hyperlipidemia    Smoker 05/14/2016    Patient Active Problem List   Diagnosis Date Noted   Irritable bowel syndrome with both constipation and diarrhea 08/20/2021   OAB (overactive bladder) 04/25/2020   Nexplanon insertion 02/25/2020   Dyslipidemia (high LDL; low HDL) 09/17/2019   Smoker 05/14/2016   Depression with anxiety 04/05/2013    Past Surgical History:  Procedure Laterality Date   NO PAST SURGERIES      OB History     Gravida  3   Para  3   Term  2   Preterm  1   AB      Living  3      SAB      IAB      Ectopic      Multiple      Live Births  3            Home Medications    Prior to Admission medications   Medication Sig Start Date End Date Taking? Authorizing Provider  azithromycin (ZITHROMAX) 250 MG tablet Take (2) tablets by mouth on day 1, then take (1) tablet by  mouth on days 2-5. 12/17/22  Yes Valentino Nose, NP  fluticasone (FLONASE) 50 MCG/ACT nasal spray Place 1 spray into both nostrils daily for 16 days. 12/17/22 01/02/23 Yes Valentino Nose, NP  ibuprofen (ADVIL) 800 MG tablet Take 1 tablet (800 mg total) by mouth every 8 (eight) hours as needed. Take with food to prevent GI upset 12/17/22  Yes Cathlean Marseilles A, NP  albuterol (VENTOLIN HFA) 108 (90 Base) MCG/ACT inhaler Inhale 1-2 puffs into the lungs every 6 (six) hours as needed for wheezing or shortness of breath. 06/14/22   Particia Nearing, PA-C  Chlorphen-Phenyleph-ASA (ALKA-SELTZER PLUS COLD PO) Take by mouth.    [provider]  etonogestrel (NEXPLANON) 68 MG IMPL implant 1 each by Subdermal route once.    [provider]  meloxicam (MOBIC) 15 MG tablet TAKE 1 TABLET(15 MG) BY MOUTH DAILY AS NEEDED FOR PAIN 10/07/22   Tommie Sams, DO  nicotine (NICODERM CQ - DOSED IN MG/24 HOURS) 14 mg/24hr patch Place 1 patch (14 mg total) onto the skin daily. 09/03/22   Ameduite, Alvino Chapel, FNP  omeprazole (PRILOSEC)  20 MG capsule TAKE 1 CAPSULE BY MOUTH DAILY 03/13/22   Derrek Monaco A, NP  promethazine-dextromethorphan (PROMETHAZINE-DM) 6.25-15 MG/5ML syrup Take 5 mLs by mouth 4 (four) times daily as needed. 08/28/22   Volney American, PA-C  simvastatin (ZOCOR) 20 MG tablet Take 1 tablet by mouth daily. 05/08/22   Estill Dooms, NP  solifenacin (VESICARE) 5 MG tablet TAKE 1 TABLET(5 MG) BY MOUTH DAILY 12/16/22   Coral Spikes, DO    Family History Family History  Problem Relation Age of Onset   Cancer Maternal Grandmother        lung   Cancer Maternal Grandfather        unsure    CAD Father    Stroke Father    Liver disease Father    Alcohol abuse Father    Other Mother        brain tumor   Anxiety disorder Sister    Asthma Son    Bronchitis Son    ADD / ADHD Son    Other Daughter        acid reflux    Social History Social History   Tobacco Use    Smoking status: Every Day    Packs/day: 0.50    Years: 15.00    Total pack years: 7.50    Types: Cigarettes   Smokeless tobacco: Never  Vaping Use   Vaping Use: Never used  Substance Use Topics   Alcohol use: No   Drug use: Never    Types: Hydrocodone     Allergies   Amoxicillin, Cephalosporins, Septra [bactrim], Ciprocinonide [fluocinolone], and Penicillins   Review of Systems Review of Systems Per HPI  Physical Exam Triage Vital Signs ED Triage Vitals  Enc Vitals Group     BP 12/17/22 1345 114/73     Pulse Rate 12/17/22 1345 (!) 112     Resp 12/17/22 1345 18     Temp 12/17/22 1345 99.7 F (37.6 C)     Temp Source 12/17/22 1345 Oral     SpO2 12/17/22 1345 98 %     Weight --      Height --      Head Circumference --      Peak Flow --      Pain Score 12/17/22 1347 8     Pain Loc --      Pain Edu? --      Excl. in [place]? --    No data found.  Updated Vital Signs BP 114/73 (BP Location: Right Arm)   Pulse (!) 112   Temp 99.7 F (37.6 C) (Oral)   Resp 18   SpO2 98%   Visual Acuity Right Eye Distance:   Left Eye Distance:   Bilateral Distance:    Right Eye Near:   Left Eye Near:    Bilateral Near:     Physical Exam Vitals and nursing note reviewed.  Constitutional:      General: She is not in acute distress.    Appearance: Normal appearance. She is ill-appearing. She is not toxic-appearing.  HENT:     Head: Normocephalic and atraumatic.     Right Ear: Ear canal and external ear normal. A middle ear effusion is present. Tympanic membrane is erythematous.     Left Ear: Ear canal and external ear normal.  No middle ear effusion. Tympanic membrane is perforated. Tympanic membrane is not erythematous.     Nose: No congestion or rhinorrhea.     Mouth/Throat:  Mouth: Mucous membranes are moist.     Pharynx: Oropharynx is clear. Posterior oropharyngeal erythema present. No oropharyngeal exudate.  Eyes:     General: No scleral icterus.    Extraocular  Movements: Extraocular movements intact.  Cardiovascular:     Rate and Rhythm: Normal rate and regular rhythm.  Pulmonary:     Effort: Pulmonary effort is normal. No respiratory distress.     Breath sounds: Normal breath sounds. No wheezing, rhonchi or rales.  Abdominal:     General: Abdomen is flat. Bowel sounds are normal. There is no distension.     Palpations: Abdomen is soft.     Tenderness: There is no abdominal tenderness.  Musculoskeletal:     Cervical back: Normal range of motion and neck supple.  Lymphadenopathy:     Cervical: No cervical adenopathy.  Skin:    General: Skin is warm and dry.     Coloration: Skin is not jaundiced or pale.     Findings: No erythema or rash.  Neurological:     Mental Status: She is alert and oriented to person, place, and time.  Psychiatric:        Behavior: Behavior is cooperative.      UC Treatments / Results  Labs (all labs ordered are listed, but only abnormal results are displayed) Labs Reviewed  POCT URINALYSIS DIP (MANUAL ENTRY) - Abnormal; Notable for the following components:      Result Value   Spec Grav, UA <=1.005 (*)    All other components within normal limits  SARS CORONAVIRUS 2 (TAT 6-24 HRS)    EKG   Radiology No results found.  Procedures Procedures (including critical care time)  Medications Ordered in UC Medications - No data to display  Initial Impression / Assessment and Plan / UC Course  I have reviewed the triage vital signs and the nursing notes.  Pertinent labs & imaging results that were available during my care of the patient were reviewed by me and considered in my medical decision making (see chart for details).   Patient is well-appearing, normotensive, afebrile, not tachycardic, not tachypneic, oxygenating well on room air.    Non-recurrent acute suppurative otitis media of right ear without spontaneous rupture of tympanic membrane On examination, patient has otitis media of the right  tympanic membrane and fluid behind the right tympanic membrane Left tympanic membrane appears perforated in 2 places; I suspect this is incidental and I discussed this with the patient Start azithromycin for acute otitis, also recommended use of Flonase nasal spray to help with effusion Can use ibuprofen 800 mg alternating with Tylenol 500 to 1000 mg for body aches  Encounter for screening for COVID-19 COVID-19 testing obtained for rule out Urinalysis today negative for signs of infection  The patient was given the opportunity to ask questions.  All questions answered to their satisfaction.  The patient is in agreement to this plan.    Final Clinical Impressions(s) / UC Diagnoses   Final diagnoses:  Non-recurrent acute suppurative otitis media of right ear without spontaneous rupture of tympanic membrane     Discharge Instructions      You have an ear infection today which may explain your symptoms.  Please take the azithromycin to treat this.  Please also start on Flonase nasal spray to help with the fluid behind your right ear.  We have tested you for COVID-19 today to be sure this is not attributing to your symptoms.  You will see the results on MyChart  tomorrow morning.  Please isolate at home until you are aware of the results.  You can take ibuprofen 800 mg every 8 hours alternating with Tylenol 500 to 1000 mg every 6 hours as needed for fever or pain.  You can also take Mucinex to help with congestion.  The Flonase will help with the postnasal drainage.  Seek care if your symptoms persist or worsen despite treatment.     ED Prescriptions     Medication Sig Dispense Auth. Provider   ibuprofen (ADVIL) 800 MG tablet Take 1 tablet (800 mg total) by mouth every 8 (eight) hours as needed. Take with food to prevent GI upset 21 tablet Cathlean Marseilles A, NP   azithromycin (ZITHROMAX) 250 MG tablet Take (2) tablets by mouth on day 1, then take (1) tablet by mouth on days 2-5. 6 tablet  Cathlean Marseilles A, NP   fluticasone (FLONASE) 50 MCG/ACT nasal spray Place 1 spray into both nostrils daily for 16 days. 16 g Valentino Nose, NP      PDMP not reviewed this encounter.   Valentino Nose, NP 12/17/22 1536

## 2022-12-18 LAB — SARS CORONAVIRUS 2 (TAT 6-24 HRS): SARS Coronavirus 2: POSITIVE — AB

## 2023-01-10 ENCOUNTER — Encounter: Payer: Self-pay | Admitting: Obstetrics and Gynecology

## 2023-01-10 ENCOUNTER — Ambulatory Visit (INDEPENDENT_AMBULATORY_CARE_PROVIDER_SITE_OTHER): Payer: Medicaid Other | Admitting: Obstetrics and Gynecology

## 2023-01-10 ENCOUNTER — Other Ambulatory Visit (HOSPITAL_COMMUNITY)
Admission: RE | Admit: 2023-01-10 | Discharge: 2023-01-10 | Disposition: A | Discharge status: Home / Self Care | Payer: Medicaid Other | Source: Ambulatory Visit | Attending: Obstetrics and Gynecology | Admitting: Obstetrics and Gynecology

## 2023-01-10 VITALS — BP 135/82 | HR 74 | Ht 60.0 in | Wt 139.4 lb

## 2023-01-10 DIAGNOSIS — Z3046 Encounter for surveillance of implantable subdermal contraceptive: Secondary | ICD-10-CM | POA: Diagnosis not present

## 2023-01-10 DIAGNOSIS — Z01419 Encounter for gynecological examination (general) (routine) without abnormal findings: Secondary | ICD-10-CM | POA: Insufficient documentation

## 2023-01-10 DIAGNOSIS — Z30017 Encounter for initial prescription of implantable subdermal contraceptive: Secondary | ICD-10-CM

## 2023-01-10 DIAGNOSIS — Z30432 Encounter for removal of intrauterine contraceptive device: Secondary | ICD-10-CM

## 2023-01-10 LAB — POCT URINE PREGNANCY: Preg Test, Ur: NEGATIVE

## 2023-01-10 MED ORDER — ETONOGESTREL 68 MG ~~LOC~~ IMPL: 68.0000 mg | DRUG_IMPLANT | Freq: Once | SUBCUTANEOUS | Status: DC

## 2023-01-10 MED ORDER — ETONOGESTREL 68 MG ~~LOC~~ IMPL
68.0000 mg | DRUG_IMPLANT | Freq: Once | SUBCUTANEOUS | Status: AC
  Administered 2023-01-10: 68 mg via SUBCUTANEOUS

## 2023-01-10 NOTE — Progress Notes (Deleted)
    [facility] PROCEDURE NOTE  Rebecca [name] is a 37 y.o. [phone] here for IUD removal.  And Nexplanon insertion.No GYN concerns.    IUD Removal  Patient identified, informed consent performed, consent signed.  Patient was in the dorsal lithotomy position, normal external genitalia was noted.  A speculum was placed in the patient's vagina, normal discharge was noted, no lesions. The cervix was visualized, no lesions, no abnormal discharge.  The strings of the IUD were grasped and pulled using ring forceps. The IUD was removed in its entirety.  Patient tolerated the procedure well.    Nexplanon Insertion Procedure Patient identified, informed consent performed, consent signed.   Patient does understand that irregular bleeding is a very common side effect of this medication. She was advised to have backup contraception for one week after placement. Pregnancy test in clinic today was negative.  Appropriate time out taken.  Patient's left arm was prepped and draped in the usual sterile fashion.. The ruler used to measure and mark insertion area.  Patient was prepped with alcohol swab and then injected with 3 ml of 1% lidocaine.  She was prepped with betadine, Nexplanon removed from packaging,  Device confirmed in needle, then inserted full length of needle and withdrawn per handbook instructions. Nexplanon was able to palpated in the patient's arm; patient palpated the insert herself. There was minimal blood loss.  Patient insertion site covered with guaze and a pressure bandage to reduce any bruising.  The patient tolerated the procedure well and was given post procedure instructions.    Arlina Robes, MD, [place] [facility] [place]

## 2023-01-10 NOTE — Addendum Note (Signed)
Addended by: Janece Canterbury on: 01/10/2023 12:36 PM   Modules accepted: Orders

## 2023-01-11 ENCOUNTER — Ambulatory Visit
Admission: EM | Admit: 2023-01-11 | Discharge: 2023-01-11 | Disposition: A | Discharge status: Home / Self Care | Payer: Medicaid Other | Attending: Family Medicine | Admitting: Family Medicine

## 2023-01-11 ENCOUNTER — Other Ambulatory Visit: Payer: Self-pay

## 2023-01-11 ENCOUNTER — Encounter: Payer: Self-pay | Admitting: Emergency Medicine

## 2023-01-11 DIAGNOSIS — L7682 Other postprocedural complications of skin and subcutaneous tissue: Secondary | ICD-10-CM | POA: Diagnosis not present

## 2023-01-11 NOTE — ED Triage Notes (Addendum)
Pt reports had birth control implant removed and new one inserted yesterday in posterior left upper arm. Pt reports has noticed drainage from bandage. Called ob and reported to come be seen at UC this morning to make sure steri strips are still intact.   Denies any fevers, nausea.

## 2023-01-11 NOTE — Discharge Instructions (Signed)
You may keep the Steri-Strip region covered with a Tegaderm ClearFilm, these are waterproof and very flexible and will keep the area well protected while you are up and moving throughout the day.  Once the Steri-Strips fall off on their own, clean the area with a good wound solution such as Hibiclens, apply ointment and keep the area covered with something like a patch bandage.  Follow-up if you begin to have significant redness, swelling, thick yellow drainage, fevers or any other concerning symptoms

## 2023-01-11 NOTE — ED Provider Notes (Signed)
[facility]    CSN: [id number] Arrival date & time: 01/11/23  0803      History   Chief Complaint Chief Complaint  Patient presents with   Follow-up    HPI Rebecca [name] is a 37 y.o. female.   Patient presenting today requesting a wound check on her Nexplanon incision from yesterday.  States she had her old Nexplanon removed and a new one inserted, this was covered with Steri-Strips and she states when she was showering yesterday she noticed that the Steri-Strip started pulling up and the wound started bleeding.  Having some very mild pain in the area but no redness, swelling, thick yellow drainage, fever, chills, body aches, sweats.  So far not tried anything for symptoms.    Past Medical History:  Diagnosis Date   Anxiety    Back pain    GERD (gastroesophageal reflux disease)    Hyperlipidemia    Smoker 05/14/2016    Patient Active Problem List   Diagnosis Date Noted   Encounter for removal of intrauterine contraceptive device (IUD) 01/10/2023   Visit for routine gyn exam 01/10/2023   Irritable bowel syndrome with both constipation and diarrhea 08/20/2021   OAB (overactive bladder) 04/25/2020   Nexplanon insertion 02/25/2020   Dyslipidemia (high LDL; low HDL) 09/17/2019   Smoker 05/14/2016   Depression with anxiety 04/05/2013    Past Surgical History:  Procedure Laterality Date   NO PAST SURGERIES      OB History     Gravida  3   Para  3   Term  2   Preterm  1   AB      Living  3      SAB      IAB      Ectopic      Multiple      Live Births  3            Home Medications    Prior to Admission medications   Medication Sig Start Date End Date Taking? Authorizing Provider  albuterol (VENTOLIN HFA) 108 (90 Base) MCG/ACT inhaler Inhale 1-2 puffs into the lungs every 6 (six) hours as needed for wheezing or shortness of breath. 06/14/22   Volney American, PA-C  etonogestrel (NEXPLANON) 68 MG IMPL implant 1 each by  Subdermal route once.    [provider]  fluticasone (FLONASE) 50 MCG/ACT nasal spray Place 1 spray into both nostrils daily for 16 days. 12/17/22 01/02/23  Eulogio Bear, NP  ibuprofen (ADVIL) 800 MG tablet Take 1 tablet (800 mg total) by mouth every 8 (eight) hours as needed. Take with food to prevent GI upset 12/17/22   Eulogio Bear, NP  meloxicam (MOBIC) 15 MG tablet TAKE 1 TABLET(15 MG) BY MOUTH DAILY AS NEEDED FOR PAIN 10/07/22   Coral Spikes, DO  nicotine (NICODERM CQ - DOSED IN MG/24 HOURS) 14 mg/24hr patch Place 1 patch (14 mg total) onto the skin daily. 09/03/22   Ameduite, Trenton Gammon, FNP  omeprazole (PRILOSEC) 20 MG capsule TAKE 1 CAPSULE BY MOUTH DAILY 03/13/22   Derrek Monaco A, NP  simvastatin (ZOCOR) 20 MG tablet Take 1 tablet by mouth daily. 05/08/22   Estill Dooms, NP    Family History Family History  Problem Relation Age of Onset   Cancer Maternal Grandmother        lung   Cancer Maternal Grandfather        unsure    CAD Father  Stroke Father    Liver disease Father    Alcohol abuse Father    Other Mother        brain tumor   Anxiety disorder Sister    Asthma Son    Bronchitis Son    ADD / ADHD Son    Other Daughter        acid reflux    Social History Social History   Tobacco Use   Smoking status: Every Day    Packs/day: 0.50    Years: 15.00    Total pack years: 7.50    Types: Cigarettes   Smokeless tobacco: Never  Vaping Use   Vaping Use: Never used  Substance Use Topics   Alcohol use: No   Drug use: Never    Types: Hydrocodone     Allergies   Amoxicillin, Cephalosporins, Septra [bactrim], Ciprocinonide [fluocinolone], and Penicillins   Review of Systems Review of Systems Per HPI  Physical Exam Triage Vital Signs ED Triage Vitals  Enc Vitals Group     BP 01/11/23 0817 116/79     Pulse Rate 01/11/23 0817 92     Resp 01/11/23 0817 20     Temp 01/11/23 0817 98 F (36.7 C)     Temp Source 01/11/23 0817 Oral      SpO2 01/11/23 0817 98 %     Weight --      Height --      Head Circumference --      Peak Flow --      Pain Score 01/11/23 0814 0     Pain Loc --      Pain Edu? --      Excl. in [place]? --    No data found.  Updated Vital Signs BP 116/79 (BP Location: Right Arm)   Pulse 92   Temp 98 F (36.7 C) (Oral)   Resp 20   SpO2 98%   Visual Acuity Right Eye Distance:   Left Eye Distance:   Bilateral Distance:    Right Eye Near:   Left Eye Near:    Bilateral Near:     Physical Exam Vitals and nursing note reviewed.  Constitutional:      Appearance: Normal appearance. She is not ill-appearing.  HENT:     Head: Atraumatic.  Eyes:     Extraocular Movements: Extraocular movements intact.     Conjunctiva/sclera: Conjunctivae normal.  Cardiovascular:     Rate and Rhythm: Normal rate and regular rhythm.     Heart sounds: Normal heart sounds.  Pulmonary:     Effort: Pulmonary effort is normal.     Breath sounds: Normal breath sounds.  Musculoskeletal:        General: Normal range of motion.     Cervical back: Normal range of motion and neck supple.  Skin:    General: Skin is warm.     Comments: Slight bleeding from the small incision from Nexplanon placement yesterday, no erythema, edema to the site and no thick drainage.  Steri-Strips have started ripping up due to water exposure  Neurological:     Mental Status: She is alert and oriented to person, place, and time.     Comments: Left upper extremity neurovascularly intact  Psychiatric:        Mood and Affect: Mood normal.        Thought Content: Thought content normal.        Judgment: Judgment normal.      UC Treatments / Results  Labs (  all labs ordered are listed, but only abnormal results are displayed) Labs Reviewed - No data to display  EKG   Radiology No results found.  Procedures Procedures (including critical care time)  Medications Ordered in UC Medications - No data to display  Initial  Impression / Assessment and Plan / UC Course  I have reviewed the triage vital signs and the nursing notes.  Pertinent labs & imaging results that were available during my care of the patient were reviewed by me and considered in my medical decision making (see chart for details).     Old Steri-Strips were fully removed as they had already started coming up likely secondary to some bleeding of the wound and shower water.  Wound was cleaned with Hibiclens, new Steri-Strips were placed and a Tegaderm water proof ClearFilm was placed over this.  Discussed home wound care and return precautions.  No evidence of infection at this time.  Final Clinical Impressions(s) / UC Diagnoses   Final diagnoses:  Incisional pain     Discharge Instructions      You may keep the Steri-Strip region covered with a Tegaderm ClearFilm, these are waterproof and very flexible and will keep the area well protected while you are up and moving throughout the day.  Once the Steri-Strips fall off on their own, clean the area with a good wound solution such as Hibiclens, apply ointment and keep the area covered with something like a patch bandage.  Follow-up if you begin to have significant redness, swelling, thick yellow drainage, fevers or any other concerning symptoms    ED Prescriptions   None    PDMP not reviewed this encounter.   Merrie Roof [place], [place] 01/11/23 [phone]

## 2023-01-13 ENCOUNTER — Encounter: Payer: Self-pay | Admitting: *Deleted

## 2023-01-13 ENCOUNTER — Ambulatory Visit (INDEPENDENT_AMBULATORY_CARE_PROVIDER_SITE_OTHER): Payer: Medicaid Other | Admitting: Women's Health

## 2023-01-13 ENCOUNTER — Encounter: Payer: Self-pay | Admitting: Women's Health

## 2023-01-13 VITALS — BP 139/87 | HR 75 | Ht 60.0 in | Wt 139.0 lb

## 2023-01-13 DIAGNOSIS — Z30013 Encounter for initial prescription of injectable contraceptive: Secondary | ICD-10-CM

## 2023-01-13 DIAGNOSIS — Z3046 Encounter for surveillance of implantable subdermal contraceptive: Secondary | ICD-10-CM | POA: Diagnosis not present

## 2023-01-13 MED ORDER — MEDROXYPROGESTERONE ACETATE 150 MG/ML IM SUSP
150.0000 mg | Freq: Once | INTRAMUSCULAR | Status: AC
  Administered 2023-01-13: 150 mg via INTRAMUSCULAR

## 2023-01-13 MED ORDER — MEDROXYPROGESTERONE ACETATE 150 MG/ML IM SUSP: 150.0000 mg | INTRAMUSCULAR | 3 refills | Status: DC

## 2023-01-13 NOTE — Addendum Note (Signed)
Addended by: Janece Canterbury on: 01/13/2023 03:44 PM   Modules accepted: Orders

## 2023-01-13 NOTE — Progress Notes (Signed)
[facility] REMOVAL Patient name: Rebecca [name] MRN [id number]  Date of birth: [birth year] Subjective Findings:   Rebecca [name] is a 37 y.o. [phone] Caucasian female being seen today for removal of a Nexplanon. Her Nexplanon was placed 01/10/23 (had removal and reinsertion), states 2hr later began having bad pain, arm feels weak, fingers numb. Went to urgent care 2/10, area cleaned and re-bandaged. Not getting any better, wants it out.  Signed copy of informed consent in chart.   No LMP recorded. Patient has had an implant. Last pap 01/10/23. Results are pending  The planned method of family planning is Depo-Provera injections then partner plans vasectomy     01/10/2023   11:49 AM 12/31/2021   10:07 AM 08/20/2021   11:00 AM 11/10/2020   11:00 AM 06/06/2020   11:00 AM  Depression screen PHQ 2/9  Decreased Interest 0 0 0 0 0  Down, Depressed, Hopeless 0 0 0 0 0  PHQ - 2 Score 0 0 0 0 0  Altered sleeping 0  1  1  Tired, decreased energy 1  1  0  Change in appetite 0  0  2  Feeling bad or failure about yourself  0  0  0  Trouble concentrating 0  0  0  Moving slowly or fidgety/restless 0  0  0  Suicidal thoughts 0  0  0  PHQ-9 Score 1  2  3  $ Difficult doing work/chores     Not difficult at all        01/10/2023   11:49 AM 08/20/2021   11:00 AM 06/06/2020   11:01 AM  GAD 7 : Generalized Anxiety Score  Nervous, Anxious, on Edge 0 0 1  Control/stop worrying 0 0 0  Worry too much - different things 0 0 0  Trouble relaxing 0 0 0  Restless 0 1 0  Easily annoyed or irritable 0 0 0  Afraid - awful might happen 0 0 0  Total GAD 7 Score 0 1 1  Anxiety Difficulty   Not difficult at all     Pertinent History Reviewed:   Reviewed past medical,surgical, social, obstetrical and family history.  Reviewed problem list, medications and allergies. Objective Findings & Procedure:    Vitals:   01/13/23 1502  BP: 139/87  Pulse: 75  Weight: 139 lb (63 kg)  Height: 5' (1.524 m)  Body mass index  is 27.15 kg/m.  No results found for this or any previous visit (from the past 24 hour(s)).   Lt arm Nexplanon site: bruised, small area dried blood, no s/s infection. Area very tender/painful. Co-exam w/ Dr. Nelda Marseille, recommends removal, should improve after that  Time out was performed.  Nexplanon site identified.  Area prepped in usual sterile fashon. One cc of 2% lidocaine was used to anesthetize the area at the distal end of the implant. A small stab incision was made right beside the implant on the distal portion.  The Nexplanon rod was grasped using hemostats and removed without difficulty.  There was less than 3 cc blood loss. There were no complications.  Steri-strips were applied over the small incision and a pressure bandage was applied.  The patient tolerated the procedure well. Assessment & Plan:   1) Nexplanon removal d/t pain/numbness/arm weakness She was instructed to keep the area clean and dry, remove pressure bandage in 24 hours, and keep insertion site covered with the steri-strip for 3-5 days.   Follow-up PRN problems.  2) Contraception management>  depo today, rx sent, f/u 37mhs for next dose. Condoms x 2wks. Have partner call to get vasectomy scheduled  No orders of the defined types were placed in this encounter.   Follow-up: Return for 11-13wks for Depo injection; 1 yr for physical.  KRoma SchanzCNM, [facility]2/11/2023 3:37 PM

## 2023-01-13 NOTE — Patient Instructions (Signed)
Keep the area clean and dry.  You can remove the big bandage in 24 hours, and the small steri-strip bandage in 3-5 days.  A back up method, such as condoms, should be used for two weeks.

## 2023-01-14 DIAGNOSIS — Z01419 Encounter for gynecological examination (general) (routine) without abnormal findings: Secondary | ICD-10-CM | POA: Insufficient documentation

## 2023-01-14 DIAGNOSIS — Z3046 Encounter for surveillance of implantable subdermal contraceptive: Secondary | ICD-10-CM | POA: Insufficient documentation

## 2023-01-14 NOTE — Progress Notes (Signed)
Rebecca [name] is a 37 y.o. [phone] female here for a routine annual gynecologic exam.  Current complaints: Removal and reinsertion of Nexplanon.   Denies abnormal vaginal bleeding, discharge, pelvic pain, problems with intercourse or other gynecologic concerns.    Gynecologic History No LMP recorded. Patient has had an implant. Contraception: Nexplanon Last Pap: 7/21. Results were: normal Last mammogram: NA.   Obstetric History OB History  Gravida Para Term Preterm AB Living  3 3 2 1   3  $ SAB IAB Ectopic Multiple Live Births          3    # Outcome Date GA Lbr Len/2nd Weight Sex Delivery Anes PTL Lv  3 Term 06/23/19 [redacted]w[redacted]d 6 lb 2 oz (2.778 kg) F Vag-Spont None N LIV  2 Term 04/25/07 [id number] lb 11 oz (3.033 kg) M Vag-Spont Other Y LIV  1 Preterm 12/19/00 3653w0d lb 11 oz (2.126 kg) F Vag-Spont Other N LIV     Complications: Preterm premature rupture of membranes (PPROM) delivered, current hospitalization    Past Medical History:  Diagnosis Date   Anxiety    Back pain    GERD (gastroesophageal reflux disease)    Hyperlipidemia    Smoker 05/14/2016    Past Surgical History:  Procedure Laterality Date   NO PAST SURGERIES      Current Outpatient Medications on File Prior to Visit  Medication Sig Dispense Refill   albuterol (VENTOLIN HFA) 108 (90 Base) MCG/ACT inhaler Inhale 1-2 puffs into the lungs every 6 (six) hours as needed for wheezing or shortness of breath. 18 g 0   ibuprofen (ADVIL) 800 MG tablet Take 1 tablet (800 mg total) by mouth every 8 (eight) hours as needed. Take with food to prevent GI upset 21 tablet 0   meloxicam (MOBIC) 15 MG tablet TAKE 1 TABLET(15 MG) BY MOUTH DAILY AS NEEDED FOR PAIN 30 tablet 3   nicotine (NICODERM CQ - DOSED IN MG/24 HOURS) 14 mg/24hr patch Place 1 patch (14 mg total) onto the skin daily. 28 patch 0   omeprazole (PRILOSEC) 20 MG capsule TAKE 1 CAPSULE BY MOUTH DAILY 30 capsule 12   simvastatin (ZOCOR) 20 MG tablet Take 1 tablet by  mouth daily. 90 tablet 3   fluticasone (FLONASE) 50 MCG/ACT nasal spray Place 1 spray into both nostrils daily for 16 days. 16 g 0   No current facility-administered medications on file prior to visit.    Allergies  Allergen Reactions   Amoxicillin Hives   Cephalosporins Nausea And Vomiting   Septra [Bactrim] Nausea And Vomiting   Ciprocinonide [Fluocinolone] Nausea And Vomiting   Penicillins Hives    Social History   Socioeconomic History   Marital status: Single    Spouse name: Not on file   Number of children: 3   Years of education: Not on file   Highest education level: 9th grade  Occupational History   Not on file  Tobacco Use   Smoking status: Every Day    Packs/day: 0.50    Years: 15.00    Total pack years: 7.50    Types: Cigarettes   Smokeless tobacco: Never  Vaping Use   Vaping Use: Never used  Substance and Sexual Activity   Alcohol use: No   Drug use: Never    Types: Hydrocodone   Sexual activity: Yes    Birth control/protection: Implant  Other Topics Concern   Not on file  Social History Narrative   Not  on file   Social Determinants of Health   Financial Resource Strain: Low Risk  (01/10/2023)   Overall Financial Resource Strain (CARDIA)    Difficulty of Paying Living Expenses: Not hard at all  Food Insecurity: No Food Insecurity (01/10/2023)   Hunger Vital Sign    Worried About Running Out of Food in the Last Year: Never true    Ran Out of Food in the Last Year: Never true  Transportation Needs: No Transportation Needs (01/10/2023)   PRAPARE - Hydrologist (Medical): No    Lack of Transportation (Non-Medical): No  Physical Activity: Insufficiently Active (01/10/2023)   Exercise Vital Sign    Days of Exercise per Week: 2 days    Minutes of Exercise per Session: 20 min  Stress: Stress Concern Present (01/10/2023)   [place]    Feeling of Stress : Rather  much  Social Connections: Moderately Isolated (01/10/2023)   Social Connection and Isolation Panel [NHANES]    Frequency of Communication with Friends and Family: More than three times a week    Frequency of Social Gatherings with Friends and Family: Once a week    Attends Religious Services: 1 to 4 times per year    Active Member of [employer] or Organizations: No    Attends Archivist Meetings: Never    Marital Status: Divorced  Human resources officer Violence: Not At Risk (01/10/2023)   Humiliation, Afraid, Rape, and Kick questionnaire    Fear of Current or Ex-Partner: No    Emotionally Abused: No    Physically Abused: No    Sexually Abused: No    Family History  Problem Relation Age of Onset   Cancer Maternal Grandmother        lung   Cancer Maternal Grandfather        unsure    CAD Father    Stroke Father    Liver disease Father    Alcohol abuse Father    Other Mother        brain tumor   Anxiety disorder Sister    Asthma Son    Bronchitis Son    ADD / ADHD Son    Other Daughter        acid reflux    The following portions of the patient's history were reviewed and updated as appropriate: allergies, current medications, past family history, past medical history, past social history, past surgical history and problem list.  Review of Systems Pertinent items noted in HPI and remainder of comprehensive ROS otherwise negative.   Objective:  BP 135/82 (BP Location: Left Arm, Patient Position: Sitting, Cuff Size: Normal)   Pulse 74   Ht 5' (1.524 m)   Wt 139 lb 6.4 oz (63.2 kg)   BMI 27.22 kg/m  CONSTITUTIONAL: Well-developed, well-nourished female in no acute distress.  HENT:  Normocephalic, atraumatic, External right and left ear normal. Oropharynx is clear and moist EYES: Conjunctivae and EOM are normal. Pupils are equal, round, and reactive to light. No scleral icterus.  NECK: Normal range of motion, supple, no masses.  Normal thyroid.  SKIN: Skin is warm and dry.  No rash noted. Not diaphoretic. No erythema. No pallor. [place]: Alert and oriented to person, place, and time. Normal reflexes, muscle tone coordination. No cranial nerve deficit noted. PSYCHIATRIC: Normal mood and affect. Normal behavior. Normal judgment and thought content. CARDIOVASCULAR: Normal heart rate noted, regular rhythm RESPIRATORY: Clear to auscultation bilaterally. Effort  and breath sounds normal, no problems with respiration noted. BREASTS: Symmetric in size. No masses, skin changes, nipple drainage, or lymphadenopathy. ABDOMEN: Soft, normal bowel sounds, no distention noted.  No tenderness, rebound or guarding.  PELVIC: Normal appearing external genitalia; normal appearing vaginal mucosa and cervix.  No abnormal discharge noted.  Pap smear obtained.  Normal uterine size, no other palpable masses, no uterine or adnexal tenderness. MUSCULOSKELETAL: Normal range of motion. No tenderness.  No cyanosis, clubbing, or edema.  2+ distal pulses.   Nexplanon Removal and Insertion  Patient identified, informed consent performed, consent signed.   Patient does understand that irregular bleeding is a very common side effect of this medication. She was advised to have backup contraception for one week after replacement of the implant. Pregnancy test in clinic today was negative.  Appropriate time out taken. Implanon site identified. Area prepped in usual sterile fashon. One ml of 1% lidocaine was used to anesthetize the area at the distal end of the implant. A small stab incision was made right beside the implant on the distal portion. The Nexplanon rod was grasped using hemostats and removed without difficulty. There was minimal blood loss. There were no complications. Area was then injected with 3 ml of 1 % lidocaine. She was re-prepped with betadine, Nexplanon removed from packaging, Device confirmed in needle, then inserted full length of needle and withdrawn per handbook instructions. Nexplanon  was able to palpated in the patient's arm; patient palpated the insert herself.  There was minimal blood loss. Patient insertion site covered with guaze and a pressure bandage to reduce any bruising. The patient tolerated the procedure well and was given post procedure instructions.  She was advised to have backup contraception for one week.         Assessment:  Annual gynecologic examination with pap smear Nexplanon removal and reinsertion Plan:  Will follow up results of pap smear and manage accordingly. F/U in 4 weeks to check Nexplanon insertion site Routine preventative health maintenance measures emphasized. Please refer to After Visit Summary for other counseling recommendations.    Chancy Milroy, MD, [place] Attending [place] for [facility], [place]

## 2023-01-17 LAB — CYTOLOGY - PAP
Chlamydia: NEGATIVE
Comment: NEGATIVE
Comment: NEGATIVE
Comment: NORMAL
Diagnosis: NEGATIVE
Diagnosis: REACTIVE
High risk HPV: NEGATIVE
Neisseria Gonorrhea: NEGATIVE

## 2023-02-22 ENCOUNTER — Other Ambulatory Visit: Payer: Self-pay

## 2023-02-22 ENCOUNTER — Encounter: Payer: Self-pay | Admitting: Emergency Medicine

## 2023-02-22 ENCOUNTER — Ambulatory Visit
Admission: EM | Admit: 2023-02-22 | Discharge: 2023-02-22 | Disposition: A | Discharge status: Home / Self Care | Payer: Medicaid Other | Attending: Family Medicine | Admitting: Family Medicine

## 2023-02-22 DIAGNOSIS — U071 COVID-19: Secondary | ICD-10-CM | POA: Insufficient documentation

## 2023-02-22 DIAGNOSIS — R6883 Chills (without fever): Secondary | ICD-10-CM | POA: Diagnosis not present

## 2023-02-22 DIAGNOSIS — J029 Acute pharyngitis, unspecified: Secondary | ICD-10-CM | POA: Diagnosis not present

## 2023-02-22 DIAGNOSIS — R112 Nausea with vomiting, unspecified: Secondary | ICD-10-CM | POA: Diagnosis not present

## 2023-02-22 LAB — POCT RAPID STREP A (OFFICE): Rapid Strep A Screen: NEGATIVE

## 2023-02-22 MED ORDER — ONDANSETRON 4 MG PO TBDP: 4.0000 mg | ORAL_TABLET | Freq: Three times a day (TID) | ORAL | 0 refills | Status: DC | PRN

## 2023-02-22 NOTE — ED Triage Notes (Addendum)
Pt reports ate at [facility] yesterday and reports abd pain cramps, emesis, chills since last night. Pt reports nasal congestion and left sided neck pain since this morning. Denies any diarrhea, fever.   Reports family that ate at same place last night also has similar symptoms.

## 2023-02-22 NOTE — ED Provider Notes (Signed)
[facility]    CSN: [id number] Arrival date & time: 02/22/23  1225      History   Chief Complaint Chief Complaint  Patient presents with   Abdominal Pain    Ear pain stomach cramps . Dry mouth and weakness - Entered by patient    HPI Rebecca [name] is a 37 y.o. female.   Patient presenting today with 1 day history of abdominal cramping, nausea, vomiting, or chills, sore swollen feeling throat with painful lymph nodes in neck, nasal congestion this morning.  Denies fever, chest pain, shortness of breath, diarrhea, severe abdominal pain.  States symptoms started shortly after eating [employer] yesterday and family members who ate similar foods there are now sick with similar symptoms.  So far not trying anything over-the-counter for symptoms.    Past Medical History:  Diagnosis Date   Anxiety    Back pain    GERD (gastroesophageal reflux disease)    Hyperlipidemia    Smoker 05/14/2016    Patient Active Problem List   Diagnosis Date Noted   Visit for routine gyn exam 01/14/2023   Encounter for removal and reinsertion of Nexplanon 01/14/2023   Irritable bowel syndrome with both constipation and diarrhea 08/20/2021   OAB (overactive bladder) 04/25/2020   Dyslipidemia (high LDL; low HDL) 09/17/2019   Smoker 05/14/2016   Depression with anxiety 04/05/2013    Past Surgical History:  Procedure Laterality Date   NO PAST SURGERIES      OB History     Gravida  3   Para  3   Term  2   Preterm  1   AB      Living  3      SAB      IAB      Ectopic      Multiple      Live Births  3            Home Medications    Prior to Admission medications   Medication Sig Start Date End Date Taking? Authorizing Provider  ondansetron (ZOFRAN-ODT) 4 MG disintegrating tablet Take 1 tablet (4 mg total) by mouth every 8 (eight) hours as needed for nausea or vomiting. 02/22/23  Yes Volney American, PA-C  albuterol (VENTOLIN HFA) 108 (90 Base) MCG/ACT  inhaler Inhale 1-2 puffs into the lungs every 6 (six) hours as needed for wheezing or shortness of breath. 06/14/22   Volney American, PA-C  fluticasone (FLONASE) 50 MCG/ACT nasal spray Place 1 spray into both nostrils daily for 16 days. 12/17/22 01/02/23  Eulogio Bear, NP  ibuprofen (ADVIL) 800 MG tablet Take 1 tablet (800 mg total) by mouth every 8 (eight) hours as needed. Take with food to prevent GI upset 12/17/22   Eulogio Bear, NP  medroxyPROGESTERone (DEPO-PROVERA) 150 MG/ML injection Inject 1 mL (150 mg total) into the muscle every 3 (three) months. 01/13/23   Roma Schanz, CNM  meloxicam (MOBIC) 15 MG tablet TAKE 1 TABLET(15 MG) BY MOUTH DAILY AS NEEDED FOR PAIN 10/07/22   Coral Spikes, DO  nicotine (NICODERM CQ - DOSED IN MG/24 HOURS) 14 mg/24hr patch Place 1 patch (14 mg total) onto the skin daily. 09/03/22   [employer], Trenton Gammon, FNP  omeprazole (PRILOSEC) 20 MG capsule TAKE 1 CAPSULE BY MOUTH DAILY 03/13/22   Derrek Monaco A, NP  simvastatin (ZOCOR) 20 MG tablet Take 1 tablet by mouth daily. 05/08/22   Estill Dooms, NP    Family  History Family History  Problem Relation Age of Onset   Cancer Maternal Grandmother        lung   Cancer Maternal Grandfather        unsure    CAD Father    Stroke Father    Liver disease Father    Alcohol abuse Father    Other Mother        brain tumor   Anxiety disorder Sister    Asthma Son    Bronchitis Son    ADD / ADHD Son    Other Daughter        acid reflux    Social History Social History   Tobacco Use   Smoking status: Every Day    Packs/day: 0.50    Years: 15.00    Additional pack years: 0.00    Total pack years: 7.50    Types: Cigarettes   Smokeless tobacco: Never  Vaping Use   Vaping Use: Never used  Substance Use Topics   Alcohol use: No   Drug use: Never    Types: Hydrocodone     Allergies   Amoxicillin, Cephalosporins, Septra [bactrim], Ciprocinonide [fluocinolone], and  Penicillins   Review of Systems Review of Systems PER HPI  Physical Exam Triage Vital Signs ED Triage Vitals  Enc Vitals Group     BP 02/22/23 1310 (!) 128/91     Pulse Rate 02/22/23 1310 86     Resp 02/22/23 1310 20     Temp 02/22/23 1310 98.7 F (37.1 C)     Temp Source 02/22/23 1310 Oral     SpO2 02/22/23 1310 99 %     Weight --      Height --      Head Circumference --      Peak Flow --      Pain Score 02/22/23 1309 0     Pain Loc --      Pain Edu? --      Excl. in [place]? --    No data found.  Updated Vital Signs BP (!) 128/91 (BP Location: Right Arm)   Pulse 86   Temp 98.7 F (37.1 C) (Oral)   Resp 20   SpO2 99%   Visual Acuity Right Eye Distance:   Left Eye Distance:   Bilateral Distance:    Right Eye Near:   Left Eye Near:    Bilateral Near:     Physical Exam Vitals and nursing note reviewed.  Constitutional:      Appearance: Normal appearance. She is not ill-appearing.  HENT:     Head: Atraumatic.     Nose: Nose normal.     Mouth/Throat:     Mouth: Mucous membranes are moist.     Pharynx: Posterior oropharyngeal erythema present. No oropharyngeal exudate.  Eyes:     Extraocular Movements: Extraocular movements intact.     Conjunctiva/sclera: Conjunctivae normal.  Cardiovascular:     Rate and Rhythm: Normal rate and regular rhythm.     Heart sounds: Normal heart sounds.  Pulmonary:     Effort: Pulmonary effort is normal.     Breath sounds: Normal breath sounds.  Abdominal:     General: Bowel sounds are normal. There is no distension.     Palpations: Abdomen is soft.     Tenderness: There is no abdominal tenderness. There is no guarding.  Musculoskeletal:        General: Normal range of motion.     Cervical back: Normal range of motion  and neck supple.  Lymphadenopathy:     Cervical: Cervical adenopathy present.  Skin:    General: Skin is warm and dry.  Neurological:     Mental Status: She is alert and oriented to person, place, and  time.  Psychiatric:        Mood and Affect: Mood normal.        Thought Content: Thought content normal.        Judgment: Judgment normal.    UC Treatments / Results  Labs (all labs ordered are listed, but only abnormal results are displayed) Labs Reviewed  CULTURE, GROUP A STREP ([place])  SARS CORONAVIRUS 2 (TAT 6-24 HRS)  POCT RAPID STREP A (OFFICE)    EKG   Radiology No results found.  Procedures Procedures (including critical care time)  Medications Ordered in UC Medications - No data to display  Initial Impression / Assessment and Plan / UC Course  I have reviewed the triage vital signs and the nursing notes.  Pertinent labs & imaging results that were available during my care of the patient were reviewed by me and considered in my medical decision making (see chart for details).     Vitals and exam overall reassuring, rapid strep negative, throat culture and COVID testing pending.  Suspect viral illness.  Discussed Zofran, supportive over-the-counter medications and home care.  Return for worsening symptoms.  Final Clinical Impressions(s) / UC Diagnoses   Final diagnoses:  Sore throat  Nausea and vomiting, unspecified vomiting type  Chills     Discharge Instructions      Take Tylenol, ibuprofen, DayQuil, NyQuil, salt water gargles, Zofran as needed for symptoms.  Make sure you are drinking plenty of fluids, including electrolyte solutions and eating a bland diet as tolerated.  Follow-up for worsening symptoms.  Your COVID test and throat culture are pending, we will let you know if either of these are positive    ED Prescriptions     Medication Sig Dispense Auth. Provider   ondansetron (ZOFRAN-ODT) 4 MG disintegrating tablet Take 1 tablet (4 mg total) by mouth every 8 (eight) hours as needed for nausea or vomiting. 20 tablet Volney American, [place]      PDMP not reviewed this encounter.   Volney American, [place] 02/22/23 1359

## 2023-02-22 NOTE — Discharge Instructions (Signed)
Take Tylenol, ibuprofen, DayQuil, NyQuil, salt water gargles, Zofran as needed for symptoms.  Make sure you are drinking plenty of fluids, including electrolyte solutions and eating a bland diet as tolerated.  Follow-up for worsening symptoms.  Your COVID test and throat culture are pending, we will let you know if either of these are positive

## 2023-02-23 LAB — SARS CORONAVIRUS 2 (TAT 6-24 HRS): SARS Coronavirus 2: POSITIVE — AB

## 2023-02-25 LAB — CULTURE, GROUP A STREP (THRC)

## 2023-03-12 ENCOUNTER — Encounter: Payer: Self-pay | Admitting: Family Medicine

## 2023-03-12 ENCOUNTER — Ambulatory Visit: Payer: Medicaid Other | Admitting: Family Medicine

## 2023-03-12 VITALS — BP 116/80 | HR 71 | Temp 98.1°F | Wt 139.0 lb

## 2023-03-12 DIAGNOSIS — M722 Plantar fascial fibromatosis: Secondary | ICD-10-CM | POA: Diagnosis not present

## 2023-03-12 MED ORDER — PREDNISONE 10 MG PO TABS: ORAL_TABLET | ORAL | 0 refills | Status: DC

## 2023-03-12 NOTE — Progress Notes (Signed)
Subjective:  Patient ID: Rebecca [name], female    DOB: 12-Oct-1986  Age: 37 y.o. MRN: [id number]  CC: Left foot pain  HPI:  37 year old female presents for evaluation of the above.  3-week history of left foot pain.  Affects the heel predominately.  She has had some discomfort in the toes.  She states that sharp and stabbing.  Worse in the morning.  Worse when she is on her feet.  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  She has tried ice, shoe inserts without resolution.  Patient Active Problem List   Diagnosis Date Noted   Plantar fasciitis of left foot 03/12/2023   Encounter for removal and reinsertion of Nexplanon 01/14/2023   Irritable bowel syndrome with both constipation and diarrhea 08/20/2021   OAB (overactive bladder) 04/25/2020   Dyslipidemia (high LDL; low HDL) 09/17/2019   Smoker 05/14/2016   Depression with anxiety 04/05/2013    Social Hx   Social History   Socioeconomic History   Marital status: Single    Spouse name: Not on file   Number of children: 3   Years of education: Not on file   Highest education level: 9th grade  Occupational History   Not on file  Tobacco Use   Smoking status: Every Day    Packs/day: 0.50    Years: 15.00    Additional pack years: 0.00    Total pack years: 7.50    Types: Cigarettes   Smokeless tobacco: Never  Vaping Use   Vaping Use: Never used  Substance and Sexual Activity   Alcohol use: No   Drug use: Never    Types: Hydrocodone   Sexual activity: Yes    Birth control/protection: Injection  Other Topics Concern   Not on file  Social History Narrative   Not on file   Social Determinants of Health   Financial Resource Strain: Low Risk  (01/10/2023)   Overall Financial Resource Strain (CARDIA)    Difficulty of Paying Living Expenses: Not hard at all  Food Insecurity: No Food Insecurity (01/10/2023)   Hunger Vital Sign    Worried About Running Out of Food in the Last Year: Never true     Ran Out of Food in the Last Year: Never true  Transportation Needs: No Transportation Needs (01/10/2023)   PRAPARE - Administrator, Civil Service (Medical): No    Lack of Transportation (Non-Medical): No  Physical Activity: Insufficiently Active (01/10/2023)   Exercise Vital Sign    Days of Exercise per Week: 2 days    Minutes of Exercise per Session: 20 min  Stress: Stress Concern Present (01/10/2023)   [employer] of Occupational Health - Occupational Stress Questionnaire    Feeling of Stress : Rather much  Social Connections: Moderately Isolated (01/10/2023)   Social Connection and Isolation Panel [NHANES]    Frequency of Communication with Friends and Family: More than three times a week    Frequency of Social Gatherings with Friends and Family: Once a week    Attends Religious Services: 1 to 4 times per year    Active Member of [employer] or Organizations: No    Attends Engineer, structural: Never    Marital Status: Divorced    Review of Systems Per HPI  Objective:  BP 116/80   Pulse 71   Temp 98.1 F (36.7 C)   Wt 139 lb (63 kg)   SpO2 96%   BMI 27.15  kg/m      03/12/2023   10:57 AM 02/22/2023    1:10 PM 01/13/2023    3:02 PM  BP/Weight  Systolic BP 116 128 139  Diastolic BP 80 91 87  Wt. (Lbs) 139  139  BMI 27.15 kg/m2  27.15 kg/m2    Physical Exam Vitals and nursing note reviewed.  Constitutional:      General: She is not in acute distress.    Appearance: Normal appearance.  HENT:     Head: Normocephalic and atraumatic.  Pulmonary:     Effort: Pulmonary effort is normal. No respiratory distress.  Musculoskeletal:       Feet:  Feet:     Comments: Left foot with tenderness at the labeled location.  This is consistent with plantar fasciitis. High arches. Neurological:     Mental Status: She is alert.  Psychiatric:        Mood and Affect: Mood normal.        Behavior: Behavior normal.     Lab Results  Component Value Date   WBC  9.6 11/10/2020   HGB 14.9 11/10/2020   HCT 45.0 11/10/2020   PLT 204 11/10/2020   GLUCOSE 66 04/19/2021   CHOL 152 04/19/2021   TRIG 111 04/19/2021   HDL 30 (L) 04/19/2021   LDLCALC 101 (H) 04/19/2021   ALT 26 04/19/2021   AST 16 04/19/2021   NA 143 04/19/2021   K 4.0 04/19/2021   CL 106 04/19/2021   CREATININE 0.82 04/19/2021   BUN 6 04/19/2021   CO2 18 (L) 04/19/2021   TSH 1.230 05/11/2015     Assessment & Plan:   Problem List Items Addressed This Visit       Musculoskeletal and Integument   Plantar fasciitis of left foot - Primary    Physical exam and history consistent with plantar fasciitis.  Advised continued use of shoe inserts and ice.  Placing on prednisone.  Advised to rest.  Exercises given.       Meds ordered this encounter  Medications   predniSONE (DELTASONE) 10 MG tablet    Sig: 50 mg daily x 2 days, then 40 mg daily x 2 days, then 30 mg daily x 2 days, then 20 mg daily x 2 days, then 10 mg daily x 2 days.    Dispense:  30 tablet    Refill:  0    Follow-up:  Return if symptoms worsen or fail to improve.  Everlene Other DO [facility]

## 2023-03-12 NOTE — Assessment & Plan Note (Signed)
Physical exam and history consistent with plantar fasciitis.  Advised continued use of shoe inserts and ice.  Placing on prednisone.  Advised to rest.  Exercises given.

## 2023-03-23 ENCOUNTER — Other Ambulatory Visit: Payer: Self-pay | Admitting: Family Medicine

## 2023-04-02 ENCOUNTER — Ambulatory Visit (INDEPENDENT_AMBULATORY_CARE_PROVIDER_SITE_OTHER): Payer: Medicaid Other | Admitting: *Deleted

## 2023-04-02 DIAGNOSIS — Z3042 Encounter for surveillance of injectable contraceptive: Secondary | ICD-10-CM | POA: Diagnosis not present

## 2023-04-02 MED ORDER — MEDROXYPROGESTERONE ACETATE 150 MG/ML IM SUSP
150.0000 mg | Freq: Once | INTRAMUSCULAR | Status: AC
  Administered 2023-04-02: 150 mg via INTRAMUSCULAR

## 2023-04-02 NOTE — Progress Notes (Signed)
   NURSE VISIT- INJECTION  SUBJECTIVE:  Rebecca [name] is a 37 y.o. [phone] female here for a Depo Provera for contraception/period management. She is a GYN patient.   OBJECTIVE:  There were no vitals taken for this visit.  Appears well, in no apparent distress  Injection administered in: Left deltoid  Meds ordered this encounter  Medications   medroxyPROGESTERone (DEPO-PROVERA) injection 150 mg    ASSESSMENT: GYN patient Depo Provera for contraception/period management PLAN: Follow-up: pt states she does not need next depo as her husband is scheduled for vasectomy  Jobe Marker  04/02/2023 10:05 AM

## 2023-04-07 ENCOUNTER — Other Ambulatory Visit: Payer: Self-pay | Admitting: Adult Health

## 2023-04-29 ENCOUNTER — Telehealth: Payer: Self-pay | Admitting: Family Medicine

## 2023-04-29 DIAGNOSIS — L989 Disorder of the skin and subcutaneous tissue, unspecified: Secondary | ICD-10-CM

## 2023-04-29 NOTE — Telephone Encounter (Signed)
Patient brought son in for appointment today while here she was checking on referral to dermatology can you see if one has been done for her.I didn't see anything in the system

## 2023-04-30 NOTE — Telephone Encounter (Signed)
Tommie Sams, DO     What is the referral for?

## 2023-05-01 NOTE — Telephone Encounter (Signed)
Patient states Dr Adriana Simas told her she needed to see dermatology for a spot on her leg that was possibly cancerous

## 2023-05-01 NOTE — Telephone Encounter (Signed)
Left message to return call 

## 2023-05-01 NOTE — Telephone Encounter (Signed)
Tommie Sams, DO     Okay to place referral to dermatologist locally - Diagnosis - Skin lesion.

## 2023-05-09 ENCOUNTER — Ambulatory Visit
Admission: EM | Admit: 2023-05-09 | Discharge: 2023-05-09 | Disposition: A | Discharge status: Home / Self Care | Payer: Managed Care, Other (non HMO) | Attending: Nurse Practitioner | Admitting: Nurse Practitioner

## 2023-05-09 DIAGNOSIS — R350 Frequency of micturition: Secondary | ICD-10-CM | POA: Diagnosis present

## 2023-05-09 LAB — POCT URINALYSIS DIP (MANUAL ENTRY)
Bilirubin, UA: NEGATIVE
Blood, UA: NEGATIVE
Glucose, UA: NEGATIVE mg/dL
Ketones, POC UA: NEGATIVE mg/dL
Nitrite, UA: NEGATIVE
Protein Ur, POC: NEGATIVE mg/dL
Spec Grav, UA: 1.02 (ref 1.010–1.025)
Urobilinogen, UA: 0.2 E.U./dL
pH, UA: 8.5 — AB (ref 5.0–8.0)

## 2023-05-09 MED ORDER — PHENAZOPYRIDINE HCL 100 MG PO TABS: 100.0000 mg | ORAL_TABLET | Freq: Three times a day (TID) | ORAL | 0 refills | Status: DC | PRN

## 2023-05-09 MED ORDER — FOSFOMYCIN TROMETHAMINE 3 G PO PACK: 3.0000 g | PACK | Freq: Once | ORAL | 0 refills | Status: AC

## 2023-05-09 NOTE — ED Provider Notes (Signed)
[facility]    CSN: [id number] Arrival date & time: 05/09/23  1230      History   Chief Complaint No chief complaint on file.   HPI Rebecca [name] is a 37 y.o. female.   Patient presents today for 2 day history of urinary frequency, urgency, voiding smaller, amounts, chills, 1 episode of vomiting 2 nights ago, and low back pain radiating down right leg.  Reports she typically has low back pain when she has a UTI.  She denies dysuria, new urinary incontinence, foul urinary odor and hematuria.  No abdominal pain, nausea, suprapubic pain/pressure, flank pain, vaginal discharge, and pelvic pain.  Has been eating and drinking normally since symptoms started.  No concern for STI today.   Patient reports she was seen approximately 1 month ago for a UTI at an alternate urgent care and was treated with doxycycline and pyridium.  Reports she has had significant antibiotic reactions in the past to Sulfa, penicillins, cephalosporins, macrobid, and Cipro. She states she can tolerate doxycycline, clindamycin, and azithromycin.    Past Medical History:  Diagnosis Date   Anxiety    Back pain    GERD (gastroesophageal reflux disease)    Hyperlipidemia    Smoker 05/14/2016    Patient Active Problem List   Diagnosis Date Noted   Plantar fasciitis of left foot 03/12/2023   Encounter for removal and reinsertion of Nexplanon 01/14/2023   Irritable bowel syndrome with both constipation and diarrhea 08/20/2021   OAB (overactive bladder) 04/25/2020   Dyslipidemia (high LDL; low HDL) 09/17/2019   Smoker 05/14/2016   Depression with anxiety 04/05/2013    Past Surgical History:  Procedure Laterality Date   NO PAST SURGERIES      OB History     Gravida  3   Para  3   Term  2   Preterm  1   AB      Living  3      SAB      IAB      Ectopic      Multiple      Live Births  3            Home Medications    Prior to Admission medications   Medication Sig  Start Date End Date Taking? Authorizing Provider  fosfomycin (MONUROL) 3 g PACK Take 3 g by mouth once for 1 dose. 05/09/23 05/09/23 Yes Valentino Nose, NP  phenazopyridine (PYRIDIUM) 100 MG tablet Take 1 tablet (100 mg total) by mouth 3 (three) times daily as needed for pain. 05/09/23  Yes Valentino Nose, NP  albuterol (VENTOLIN HFA) 108 (90 Base) MCG/ACT inhaler Inhale 1-2 puffs into the lungs every 6 (six) hours as needed for wheezing or shortness of breath. 06/14/22   Particia Nearing, PA-C  fluticasone (FLONASE) 50 MCG/ACT nasal spray Place 1 spray into both nostrils daily for 16 days. 12/17/22 01/02/23  Valentino Nose, NP  ibuprofen (ADVIL) 800 MG tablet Take 1 tablet (800 mg total) by mouth every 8 (eight) hours as needed. Take with food to prevent GI upset 12/17/22   Valentino Nose, NP  medroxyPROGESTERone (DEPO-PROVERA) 150 MG/ML injection Inject 1 mL (150 mg total) into the muscle every 3 (three) months. 01/13/23   Cheral Marker, CNM  meloxicam (MOBIC) 15 MG tablet TAKE 1 TABLET(15 MG) BY MOUTH DAILY AS NEEDED FOR PAIN 10/07/22   Tommie Sams, DO  nicotine (NICODERM CQ - DOSED IN MG/24  HOURS) 14 mg/24hr patch Place 1 patch (14 mg total) onto the skin daily. 09/03/22   [employer], Alvino Chapel, FNP  omeprazole (PRILOSEC) 20 MG capsule TAKE 1 CAPSULE BY MOUTH DAILY 04/07/23   Cyril Mourning A, NP  ondansetron (ZOFRAN-ODT) 4 MG disintegrating tablet Take 1 tablet (4 mg total) by mouth every 8 (eight) hours as needed for nausea or vomiting. 02/22/23   Particia Nearing, PA-C  predniSONE (DELTASONE) 10 MG tablet 50 mg daily x 2 days, then 40 mg daily x 2 days, then 30 mg daily x 2 days, then 20 mg daily x 2 days, then 10 mg daily x 2 days. 03/12/23   Tommie Sams, DO  simvastatin (ZOCOR) 20 MG tablet Take 1 tablet by mouth daily. 05/08/22   Adline Potter, NP  solifenacin (VESICARE) 5 MG tablet TAKE 1 TABLET(5 MG) BY MOUTH DAILY 03/24/23   Tommie Sams, DO    Family  History Family History  Problem Relation Age of Onset   Cancer Maternal Grandmother        lung   Cancer Maternal Grandfather        unsure    CAD Father    Stroke Father    Liver disease Father    Alcohol abuse Father    Other Mother        brain tumor   Anxiety disorder Sister    Asthma Son    Bronchitis Son    ADD / ADHD Son    Other Daughter        acid reflux    Social History Social History   Tobacco Use   Smoking status: Every Day    Packs/day: 0.50    Years: 15.00    Additional pack years: 0.00    Total pack years: 7.50    Types: Cigarettes   Smokeless tobacco: Never  Vaping Use   Vaping Use: Never used  Substance Use Topics   Alcohol use: No   Drug use: Never    Types: Hydrocodone     Allergies   Amoxicillin, Cephalosporins, Septra [bactrim], Ciprocinonide [fluocinolone], Macrobid [nitrofurantoin], and Penicillins   Review of Systems Review of Systems Per HPI  Physical Exam Triage Vital Signs ED Triage Vitals  Enc Vitals Group     BP 05/09/23 1235 132/80     Pulse Rate 05/09/23 1235 96     Resp 05/09/23 1235 14     Temp 05/09/23 1235 99 F (37.2 C)     Temp Source 05/09/23 1235 Oral     SpO2 05/09/23 1235 98 %     Weight --      Height --      Head Circumference --      Peak Flow --      Pain Score 05/09/23 1237 6     Pain Loc --      Pain Edu? --      Excl. in GC? --    No data found.  Updated Vital Signs BP 132/80 (BP Location: Right Arm)   Pulse 96   Temp 99 F (37.2 C) (Oral)   Resp 14   SpO2 98%   Visual Acuity Right Eye Distance:   Left Eye Distance:   Bilateral Distance:    Right Eye Near:   Left Eye Near:    Bilateral Near:     Physical Exam Vitals and nursing note reviewed.  Constitutional:      General: She is not in acute distress.  Appearance: She is not toxic-appearing.  Cardiovascular:     Rate and Rhythm: Normal rate and regular rhythm.  Pulmonary:     Effort: Pulmonary effort is normal. No  respiratory distress.  Abdominal:     General: Abdomen is flat. Bowel sounds are normal. There is no distension.     Palpations: Abdomen is soft. There is no mass.     Tenderness: There is no abdominal tenderness. There is no right CVA tenderness, left CVA tenderness or guarding.  Skin:    General: Skin is warm and dry.     Coloration: Skin is not jaundiced or pale.     Findings: No erythema.  Neurological:     Mental Status: She is alert and oriented to person, place, and time.     Motor: No weakness.     Gait: Gait normal.  Psychiatric:        Behavior: Behavior is cooperative.      UC Treatments / Results  Labs (all labs ordered are listed, but only abnormal results are displayed) Labs Reviewed  POCT URINALYSIS DIP (MANUAL ENTRY) - Abnormal; Notable for the following components:      Result Value   pH, UA 8.5 (*)    Leukocytes, UA Small (1+) (*)    All other components within normal limits  URINE CULTURE    EKG   Radiology No results found.  Procedures Procedures (including critical care time)  Medications Ordered in UC Medications - No data to display  Initial Impression / Assessment and Plan / UC Course  I have reviewed the triage vital signs and the nursing notes.  Pertinent labs & imaging results that were available during my care of the patient were reviewed by me and considered in my medical decision making (see chart for details).   Patient is well-appearing, normotensive, afebrile, not tachycardic, not tachypneic, oxygenating well on room air.    1. Urinary frequency UA today shows 1+ leukocyte esterase and elevated pH Urine culture is pending Given significant antibiotic allergies, will treat for UTI in meantime with fosfomycin Resume pyridium  ER and return precautions discussed with patient  The patient was given the opportunity to ask questions.  All questions answered to their satisfaction.  The patient is in agreement to this plan.   Final  Clinical Impressions(s) / UC Diagnoses   Final diagnoses:  Urinary frequency     Discharge Instructions      The urine test today shows white blood cells.  Take the fosfomycin as prescribed to treat for UTI.  The urine culture is pending and we will call you early next week if we need to treat with a different antibiotic.    ED Prescriptions     Medication Sig Dispense Auth. Provider   fosfomycin (MONUROL) 3 g PACK Take 3 g by mouth once for 1 dose. 3 g Cathlean Marseilles A, NP   phenazopyridine (PYRIDIUM) 100 MG tablet Take 1 tablet (100 mg total) by mouth 3 (three) times daily as needed for pain. 10 tablet Valentino Nose, NP      PDMP not reviewed this encounter.   Valentino Nose, NP 05/09/23 1312

## 2023-05-09 NOTE — Discharge Instructions (Addendum)
The urine test today shows white blood cells.  Take the fosfomycin as prescribed to treat for UTI.  The urine culture is pending and we will call you early next week if we need to treat with a different antibiotic.

## 2023-05-09 NOTE — ED Triage Notes (Signed)
Pt c/o UTI sx's x 2 days, pt states she has pressure back pain and frequency , chills, dry mouth.

## 2023-05-10 LAB — URINE CULTURE: Culture: NO GROWTH

## 2023-05-23 ENCOUNTER — Ambulatory Visit (INDEPENDENT_AMBULATORY_CARE_PROVIDER_SITE_OTHER): Payer: Managed Care, Other (non HMO) | Admitting: Nurse Practitioner

## 2023-05-23 ENCOUNTER — Encounter: Payer: Self-pay | Admitting: Nurse Practitioner

## 2023-05-23 VITALS — BP 125/78 | Ht 60.0 in | Wt 143.2 lb

## 2023-05-23 DIAGNOSIS — L732 Hidradenitis suppurativa: Secondary | ICD-10-CM | POA: Diagnosis not present

## 2023-05-23 MED ORDER — DOXYCYCLINE HYCLATE 100 MG PO CAPS: 100.0000 mg | ORAL_CAPSULE | Freq: Two times a day (BID) | ORAL | 2 refills | Status: DC

## 2023-05-23 NOTE — Patient Instructions (Signed)
Hidradenitis Suppurativa Hidradenitis suppurativa is a long-term (chronic) skin disease. It is similar to a severe form of acne, but it affects areas of the body where acne would be unusual, especially areas of the body where skin rubs against skin and becomes moist. These include: Underarms. Groin. Genital area. Buttocks. Upper thighs. Breasts. Hidradenitis suppurativa may start out as small lumps or pimples caused by blocked skin pores, sweat glands, or hair follicles. Pimples may develop into deep sores that break open (rupture) and drain pus. Over time, affected areas of skin may thicken and become scarred. This condition is rare and does not spread from person to person (non-contagious). What are the causes? The exact cause of this condition is not known. It may be related to: Female and female hormones. An overactive disease-fighting system (immune system). The immune system may over-react to blocked hair follicles or sweat glands and cause swelling and pus-filled sores. What increases the risk? You are more likely to develop this condition if you: Are female. Are 11-55 years old. Have a family history of hidradenitis suppurativa. Have a personal history of acne. Are overweight. Smoke. Take the medicine lithium. What are the signs or symptoms? The first symptoms are usually painful bumps in the skin, similar to pimples. The condition may get worse over time (progress), or it may only cause mild symptoms. If the disease progresses, symptoms may include: Skin bumps getting bigger and growing deeper into the skin. Bumps rupturing and draining pus. Itchy, infected skin. Skin getting thicker and scarred. Tunnels under the skin (fistulas) where pus drains from a bump. Pain during daily activities, such as pain during walking if your groin area is affected. Emotional problems, such as stress or depression. This condition may affect your appearance and your ability or willingness to wear  certain clothes or do certain activities. How is this diagnosed? This condition is diagnosed by a health care provider who specializes in skin conditions (dermatologist). You may be diagnosed based on: Your symptoms and medical history. A physical exam. Testing a pus sample for infection. Blood tests. How is this treated? Your treatment will depend on how severe your symptoms are. The same treatment will not work for everybody with this condition. You may need to try several treatments to find what works best for you. Treatment may include: Cleaning and bandaging (dressing) your wounds as needed. Lifestyle changes, such as new skin care routines. Taking medicines, such as: Antibiotics. Acne medicines. Medicines to reduce the activity of the immune system. A diabetes medicine (metformin). Birth control pills, for women. Steroids to reduce swelling and pain. Working with a mental health care provider, if you experience emotional distress due to this condition. If you have severe symptoms that do not get better with medicine, you may need surgery. Surgery may involve: Using a laser to clear the skin and remove hair follicles. Opening and draining deep sores. Removing the areas of skin that are diseased and scarred. Follow these instructions at home: Medicines  Take over-the-counter and prescription medicines only as told by your health care provider. If you were prescribed antibiotics, take them as told by your health care provider. Do not stop using the antibiotic even if your condition improves. Skin care If you have open wounds, cover them with a clean dressing as told by your health care provider. Keep wounds clean by washing them gently with soap and water when you bathe. Do not shave the areas where you get hidradenitis suppurativa. Wear loose-fitting clothes. Try to avoid   getting overheated or sweaty. If you get sweaty or wet, change into clean, dry clothes as soon as you can. To  help relieve pain and itchiness, cover sore areas with a warm, clean washcloth (warm compress) for 5-10 minutes as often as needed. Your healthcare provider may recommend an antiperspirant deodorant that may be gentle on your skin. A daily antiseptic wash to cleanse affected areas may be suggested by your healthcare provider. General instructions Learn as much as you can about your disease so that you have an active role in your treatment. Work closely with your health care provider to find treatments that work for you. If you are overweight, work with your health care provider to lose weight as recommended. Do not use any products that contain nicotine or tobacco. These products include cigarettes, chewing tobacco, and vaping devices, such as e-cigarettes. If you need help quitting, ask your health care provider. If you struggle with living with this condition, talk with your health care provider or work with a mental health care provider as recommended. Keep all follow-up visits. Where to find more information Hidradenitis Suppurativa Foundation, Inc.: www.hs-foundation.org American Academy of Dermatology: www.aad.org Contact a health care provider if: You have a flare-up of hidradenitis suppurativa. You have a fever or chills. You have trouble controlling your symptoms at home. You have trouble doing your daily activities because of your symptoms. You have trouble dealing with emotional problems related to your condition. Summary Hidradenitis suppurativa is a long-term (chronic) skin disease. It is similar to a severe form of acne, but it affects areas of the body where acne would be unusual. The first symptoms are usually painful bumps in the skin, similar to pimples. The condition may only cause mild symptoms, or it may get worse over time (progress). If you have open wounds, cover them with a clean dressing as told by your health care provider. Keep wounds clean by washing them gently with  soap and water when you bathe. Besides skin care, treatment may include medicines, laser treatment, and surgery. This information is not intended to replace advice given to you by your health care provider. Make sure you discuss any questions you have with your health care provider. Document Revised: 01/09/2022 Document Reviewed: 01/09/2022 Elsevier Patient Education  2024 Elsevier Inc.  

## 2023-05-23 NOTE — Progress Notes (Signed)
   Subjective:    Patient ID: Rebecca [name], female    DOB: Jul 14, 1986, 37 y.o.   MRN: [id number]  HPI  Patient arrives with recurrent boils in private area Has had a problem with pus bumps and boils around her upper thigh area since she was a teenager.  Has stopped using a razor to shave her private area, now using a trimmer.  4 days ago developed a boil about the size of a "nickel".  Only left inner buttock.  Started draining 2 days ago and seems to be smaller in size.  Was extremely tender at 1 point.  No fever.  Birth control: Her husband has had a vasectomy. Patient was diagnosed with hidradenitis suppurativa at a visit on 09/03/2022.  Review of Systems  Constitutional:  Negative for fever.  Respiratory:  Negative for cough, chest tightness and shortness of breath.   Cardiovascular:  Negative for chest pain.  Skin:  Positive for rash.       Objective:   Physical Exam NAD.  Alert, oriented.  Lungs clear.  Heart regular rate rhythm.  Multiple lesions in various stages of healing noted around the perianal area and on the sides of the buttocks.  Does not involve the gluteal cleft.  Area of concern is very small in size minimally tender.  No significant erythema.  No abscesses noted at this time.  Multiple areas of scarring noted along this area consistent with hidradenitis. Today's Vitals   05/23/23 1010  BP: 125/78  Weight: 143 lb 3.2 oz (65 kg)  Height: 5' (1.524 m)   Body mass index is 27.97 kg/m.        Assessment & Plan:   Problem List Items Addressed This Visit       Musculoskeletal and Integument   Hidradenitis suppurativa - Primary   Meds ordered this encounter  Medications   doxycycline (VIBRAMYCIN) 100 MG capsule    Sig: Take 1 capsule (100 mg total) by mouth 2 (two) times daily.    Dispense:  60 capsule    Refill:  2    Order Specific Question:   Supervising Provider    Answer:   Lilyan Punt A [[id number]]   Take doxycycline as directed with development of  any pustules or abscesses.  If frequent problems, recommend daily suppressive therapy.  Given written and verbal information on preventive measures and care. Warning signs reviewed.  Call back if worsens or persist.

## 2023-06-13 ENCOUNTER — Encounter: Payer: Self-pay | Admitting: Emergency Medicine

## 2023-06-13 ENCOUNTER — Ambulatory Visit
Admission: EM | Admit: 2023-06-13 | Discharge: 2023-06-13 | Disposition: A | Discharge status: Home / Self Care | Payer: Managed Care, Other (non HMO) | Attending: Family Medicine | Admitting: Family Medicine

## 2023-06-13 DIAGNOSIS — K047 Periapical abscess without sinus: Secondary | ICD-10-CM | POA: Diagnosis not present

## 2023-06-13 DIAGNOSIS — H66002 Acute suppurative otitis media without spontaneous rupture of ear drum, left ear: Secondary | ICD-10-CM | POA: Diagnosis not present

## 2023-06-13 MED ORDER — CLINDAMYCIN HCL 300 MG PO CAPS: 300.0000 mg | ORAL_CAPSULE | Freq: Two times a day (BID) | ORAL | 0 refills | Status: DC

## 2023-06-13 NOTE — ED Provider Notes (Signed)
[facility]    CSN: [id number] Arrival date & time: 06/13/23  1408      History   Chief Complaint No chief complaint on file.   HPI LOLITHA [name] is a 37 y.o. female.   Presenting today with 1 day history of left ear pain and also started this morning with pain, redness and swelling to the left posterior lower gums.  No known injury to either area, and denies fever, chills, difficulty breathing or swallowing, discharge from either area.  So far not trying thing for symptoms.    Past Medical History:  Diagnosis Date   Anxiety    Back pain    GERD (gastroesophageal reflux disease)    Hyperlipidemia    Smoker 05/14/2016    Patient Active Problem List   Diagnosis Date Noted   Hidradenitis suppurativa 05/23/2023   Plantar fasciitis of left foot 03/12/2023   Encounter for removal and reinsertion of Nexplanon 01/14/2023   Irritable bowel syndrome with both constipation and diarrhea 08/20/2021   OAB (overactive bladder) 04/25/2020   Dyslipidemia (high LDL; low HDL) 09/17/2019   Smoker 05/14/2016   Depression with anxiety 04/05/2013    Past Surgical History:  Procedure Laterality Date   NO PAST SURGERIES      OB History     Gravida  3   Para  3   Term  2   Preterm  1   AB      Living  3      SAB      IAB      Ectopic      Multiple      Live Births  3            Home Medications    Prior to Admission medications   Medication Sig Start Date End Date Taking? Authorizing Provider  clindamycin (CLEOCIN) 300 MG capsule Take 1 capsule (300 mg total) by mouth 2 (two) times daily. 06/13/23  Yes Particia Nearing, PA-C  doxycycline (VIBRAMYCIN) 100 MG capsule Take 1 capsule (100 mg total) by mouth 2 (two) times daily. 05/23/23   Campbell Riches, NP  ibuprofen (ADVIL) 800 MG tablet Take 1 tablet (800 mg total) by mouth every 8 (eight) hours as needed. Take with food to prevent GI upset 12/17/22   Valentino Nose, NP  meloxicam  (MOBIC) 15 MG tablet TAKE 1 TABLET(15 MG) BY MOUTH DAILY AS NEEDED FOR PAIN 10/07/22   Tommie Sams, DO  omeprazole (PRILOSEC) 20 MG capsule TAKE 1 CAPSULE BY MOUTH DAILY 04/07/23   Adline Potter, NP  simvastatin (ZOCOR) 20 MG tablet Take 1 tablet by mouth daily. 05/08/22   Adline Potter, NP    Family History Family History  Problem Relation Age of Onset   Cancer Maternal Grandmother        lung   Cancer Maternal Grandfather        unsure    CAD Father    Stroke Father    Liver disease Father    Alcohol abuse Father    Other Mother        brain tumor   Anxiety disorder Sister    Asthma Son    Bronchitis Son    ADD / ADHD Son    Other Daughter        acid reflux    Social History Social History   Tobacco Use   Smoking status: Every Day    Current packs/day: 0.50  Average packs/day: 0.5 packs/day for 15.0 years (7.5 ttl pk-yrs)    Types: Cigarettes   Smokeless tobacco: Never  Vaping Use   Vaping status: Never Used  Substance Use Topics   Alcohol use: No   Drug use: Never    Types: Hydrocodone     Allergies   Amoxicillin, Cephalosporins, Septra [bactrim], Ciprocinonide [fluocinolone], Macrobid [nitrofurantoin], and Penicillins   Review of Systems Review of Systems Per HPI  Physical Exam Triage Vital Signs ED Triage Vitals  Encounter Vitals Group     BP 06/13/23 1531 116/81     Systolic BP Percentile --      Diastolic BP Percentile --      Pulse Rate 06/13/23 1531 86     Resp 06/13/23 1531 18     Temp 06/13/23 1531 98.5 F (36.9 C)     Temp Source 06/13/23 1531 Oral     SpO2 06/13/23 1531 99 %     Weight --      Height --      Head Circumference --      Peak Flow --      Pain Score 06/13/23 1532 6     Pain Loc --      Pain Education --      Exclude from Growth Chart --    No data found.  Updated Vital Signs BP 116/81 (BP Location: Right Arm)   Pulse 86   Temp 98.5 F (36.9 C) (Oral)   Resp 18   SpO2 99%   Visual Acuity Right  Eye Distance:   Left Eye Distance:   Bilateral Distance:    Right Eye Near:   Left Eye Near:    Bilateral Near:     Physical Exam Vitals and nursing note reviewed.  Constitutional:      Appearance: Normal appearance. She is not ill-appearing.  HENT:     Head: Atraumatic.     Right Ear: Tympanic membrane normal.     Ears:     Comments: Left TM erythematous, edematous    Mouth/Throat:     Comments: Left lower posterior gums erythematous, edematous, tender to palpation Eyes:     Extraocular Movements: Extraocular movements intact.     Conjunctiva/sclera: Conjunctivae normal.  Cardiovascular:     Rate and Rhythm: Normal rate and regular rhythm.     Heart sounds: Normal heart sounds.  Pulmonary:     Effort: Pulmonary effort is normal.     Breath sounds: Normal breath sounds.  Musculoskeletal:        General: Normal range of motion.     Cervical back: Normal range of motion and neck supple.  Lymphadenopathy:     Cervical: No cervical adenopathy.  Skin:    General: Skin is warm and dry.  Neurological:     Mental Status: She is alert and oriented to person, place, and time.  Psychiatric:        Mood and Affect: Mood normal.        Thought Content: Thought content normal.        Judgment: Judgment normal.      UC Treatments / Results  Labs (all labs ordered are listed, but only abnormal results are displayed) Labs Reviewed - No data to display  EKG   Radiology No results found.  Procedures Procedures (including critical care time)  Medications Ordered in UC Medications - No data to display  Initial Impression / Assessment and Plan / UC Course  I have reviewed the triage  vital signs and the nursing notes.  Pertinent labs & imaging results that were available during my care of the patient were reviewed by me and considered in my medical decision making (see chart for details).     Will cover for both potential infection areas with clindamycin, ibuprofen and  Tylenol for pain as needed.  Return for worsening symptoms.  Final Clinical Impressions(s) / UC Diagnoses   Final diagnoses:  Acute suppurative otitis media of left ear without spontaneous rupture of tympanic membrane, recurrence not specified  Dental infection   Discharge Instructions   None    ED Prescriptions     Medication Sig Dispense Auth. Provider   clindamycin (CLEOCIN) 300 MG capsule Take 1 capsule (300 mg total) by mouth 2 (two) times daily. 14 capsule Particia Nearing, [place]      PDMP not reviewed this encounter.   Particia Nearing, [place] 06/13/23 1603

## 2023-06-13 NOTE — ED Triage Notes (Signed)
Left ear pain that started this morning.  Hurts to swallow.  States the gums around her lower tooth on that side are red.

## 2023-06-30 ENCOUNTER — Other Ambulatory Visit: Payer: Self-pay | Admitting: Family Medicine

## 2023-07-03 ENCOUNTER — Telehealth: Payer: Self-pay

## 2023-07-03 NOTE — Telephone Encounter (Signed)
Prescription Request  07/03/2023  LOV: Visit date not found  What is the name of the medication or equipment? meloxicam (MOBIC) 15 MG tablet  simvastatin (ZOCOR) 20 MG tablet  Have you contacted your pharmacy to request a refill? Yes   Which pharmacy would you like this sent to?  [id number] - [place], [place] - [address] [place] [address] Phone: [phone] Fax: [phone]    Patient notified that their request is being sent to the clinical staff for review and that they should receive a response within 2 business days.   Please advise at Mobile [phone] (mobile)

## 2023-07-04 ENCOUNTER — Other Ambulatory Visit: Payer: Self-pay | Admitting: Nurse Practitioner

## 2023-07-04 MED ORDER — MELOXICAM 15 MG PO TABS: ORAL_TABLET | ORAL | 0 refills | Status: DC

## 2023-07-04 MED ORDER — SIMVASTATIN 20 MG PO TABS: ORAL_TABLET | ORAL | 0 refills | Status: DC

## 2023-07-08 ENCOUNTER — Encounter: Payer: Self-pay | Admitting: Nurse Practitioner

## 2023-07-08 NOTE — Telephone Encounter (Signed)
Called and spoke with patient she has not had labs in a while.

## 2023-07-09 ENCOUNTER — Other Ambulatory Visit: Payer: Self-pay | Admitting: Nurse Practitioner

## 2023-07-09 DIAGNOSIS — Z1329 Encounter for screening for other suspected endocrine disorder: Secondary | ICD-10-CM

## 2023-07-09 DIAGNOSIS — Z130 Encounter for screening for diseases of the blood and blood-forming organs and certain disorders involving the immune mechanism: Secondary | ICD-10-CM

## 2023-07-09 DIAGNOSIS — E785 Hyperlipidemia, unspecified: Secondary | ICD-10-CM

## 2023-07-09 DIAGNOSIS — Z13228 Encounter for screening for other metabolic disorders: Secondary | ICD-10-CM

## 2023-07-09 NOTE — Telephone Encounter (Signed)
Patient notified via Mychart.

## 2023-07-11 ENCOUNTER — Encounter: Payer: Self-pay | Admitting: Nurse Practitioner

## 2023-07-11 ENCOUNTER — Telehealth: Payer: Managed Care, Other (non HMO) | Admitting: Nurse Practitioner

## 2023-07-11 DIAGNOSIS — F419 Anxiety disorder, unspecified: Secondary | ICD-10-CM | POA: Diagnosis not present

## 2023-07-11 DIAGNOSIS — M542 Cervicalgia: Secondary | ICD-10-CM | POA: Diagnosis not present

## 2023-07-11 DIAGNOSIS — F430 Acute stress reaction: Secondary | ICD-10-CM | POA: Diagnosis not present

## 2023-07-11 DIAGNOSIS — G8929 Other chronic pain: Secondary | ICD-10-CM

## 2023-07-11 DIAGNOSIS — F411 Generalized anxiety disorder: Secondary | ICD-10-CM | POA: Insufficient documentation

## 2023-07-11 DIAGNOSIS — M549 Dorsalgia, unspecified: Secondary | ICD-10-CM | POA: Diagnosis not present

## 2023-07-11 DIAGNOSIS — R5383 Other fatigue: Secondary | ICD-10-CM

## 2023-07-11 MED ORDER — ESCITALOPRAM OXALATE 10 MG PO TABS: 10.0000 mg | ORAL_TABLET | Freq: Every day | ORAL | 0 refills | Status: DC

## 2023-07-11 MED ORDER — CLONAZEPAM 0.5 MG PO TABS: ORAL_TABLET | ORAL | 0 refills | Status: DC

## 2023-07-11 NOTE — Addendum Note (Signed)
Addended by: Campbell Riches on: 07/11/2023 06:13 PM   Modules accepted: Orders, Level of Service

## 2023-07-11 NOTE — Progress Notes (Addendum)
Virtual Visit via Video Note  I connected with Rebecca [name] on 07/11/23 at  3:40 PM EDT by a video enabled telemedicine application and verified that I am speaking with the correct person using two identifiers.  Location: Patient: home Provider: office   I discussed the limitations of evaluation and management by telemedicine and the availability of in person appointments. The patient expressed understanding and agreed to proceed.  History of Present Illness: Presents for several issues. Wants to review recent labs.  Under tremendous stress lately. Taking care of her mother who has dementia. Has limited part time support. Her husband works as a Naval architect during the week so she provides childcare. 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 Has taken Xanax years ago with another provider for anxiety and stress. Did not take daily medication. Denies suicidal or homicidal thoughts or ideation. Denies self harm behaviors. Continue to smoke since this helps her anxiety.  Also, c/o upper back problems at the base of the cervical spine/top of thoracic spine. Long term problem. Feels "swollen" at times.     07/11/2023    3:36 PM  Depression screen PHQ 2/9  Decreased Interest 0  Down, Depressed, Hopeless 0  PHQ - 2 Score 0  Altered sleeping 3  Tired, decreased energy 1  Change in appetite 1  Feeling bad or failure about yourself  0  Trouble concentrating 0  Moving slowly or fidgety/restless 0  Suicidal thoughts 0  PHQ-9 Score 5  Difficult doing work/chores Not difficult at all      07/11/2023    3:34 PM 03/12/2023   11:01 AM 01/10/2023   11:49 AM 08/20/2021   11:00 AM  GAD 7 : Generalized Anxiety Score  Nervous, Anxious, on Edge 2 0 0 0  Control/stop worrying 3 0 0 0  Worry too much - different things 2 0 0 0  Trouble relaxing 3 1 0 0  Restless 2 0 0 1  Easily annoyed or irritable 1 0 0 0  Afraid - awful might happen 2 0 0 0  Total GAD 7  Score 15 1 0 1  Anxiety Difficulty Not difficult at all Not difficult at all       Observations/Objective: NAD. Alert, oriented. Making good eye contact. Calm affect. Tearful at times when discussing her mother. Speech clear. Normal judgement. Thoughts logical, coherent and relevant. Points to the base of the cervical spine at the area of her discomfort.  Results for orders placed or performed in visit on 07/09/23  CBC with Differential/Platelet  Result Value Ref Range   WBC 8.0 3.4 - 10.8 x10E3/uL   RBC 4.61 3.77 - 5.28 x10E6/uL   Hemoglobin 14.1 11.1 - 15.9 g/dL   Hematocrit 46.9 62.9 - 46.6 %   MCV 91 79 - 97 fL   MCH 30.6 26.6 - 33.0 pg   MCHC 33.8 31.5 - 35.7 g/dL   RDW 52.8 41.3 - 24.4 %   Platelets 182 150 - 450 x10E3/uL   Neutrophils 48 Not Estab. %   Lymphs 42 Not Estab. %   Monocytes 7 Not Estab. %   Eos 2 Not Estab. %   Basos 1 Not Estab. %   Neutrophils Absolute 3.9 1.4 - 7.0 x10E3/uL   Lymphocytes Absolute 3.3 (H) 0.7 - 3.1 x10E3/uL   Monocytes Absolute 0.6 0.1 - 0.9 x10E3/uL   EOS (ABSOLUTE) 0.1 0.0 - 0.4 x10E3/uL   Basophils Absolute 0.0 0.0 -  0.2 x10E3/uL   Immature Granulocytes 0 Not Estab. %   Immature Grans (Abs) 0.0 0.0 - 0.1 x10E3/uL  Comprehensive metabolic panel  Result Value Ref Range   Glucose 89 70 - 99 mg/dL   BUN 4 (L) 6 - 20 mg/dL   Creatinine, Ser 8.65 0.57 - 1.00 mg/dL   eGFR 784 >69 GE/XBM/8.41   BUN/Creatinine Ratio 5 (L) 9 - 23   Sodium 141 134 - 144 mmol/L   Potassium 3.8 3.5 - 5.2 mmol/L   Chloride 107 (H) 96 - 106 mmol/L   CO2 22 20 - 29 mmol/L   Calcium 9.0 8.7 - 10.2 mg/dL   Total Protein 6.3 6.0 - 8.5 g/dL   Albumin 4.1 3.9 - 4.9 g/dL   Globulin, Total 2.2 1.5 - 4.5 g/dL   Bilirubin Total 0.2 0.0 - 1.2 mg/dL   Alkaline Phosphatase 101 44 - 121 IU/L   AST 19 0 - 40 IU/L   ALT 31 0 - 32 IU/L  Lipid panel  Result Value Ref Range   Cholesterol, Total 146 100 - 199 mg/dL   Triglycerides 324 0 - 149 mg/dL   HDL 33 (L) >40 mg/dL    VLDL Cholesterol Cal 20 5 - 40 mg/dL   LDL Chol Calc (NIH) 93 0 - 99 mg/dL   Chol/HDL Ratio 4.4 0.0 - 4.4 ratio  TSH  Result Value Ref Range   TSH 1.010 0.450 - 4.500 uIU/mL   Reviewed labs with patient.  Assessment and Plan: Problem List Items Addressed This Visit       Other   Anxiety as acute reaction to exceptional stress   Relevant Medications   escitalopram (LEXAPRO) 10 MG tablet   Other Visit Diagnoses     Upper back pain, chronic    -  Primary   Relevant Medications   clonazePAM (KLONOPIN) 0.5 MG tablet   escitalopram (LEXAPRO) 10 MG tablet   Other Relevant Orders   DG Thoracic Spine 2 View   Cervical spine pain       Relevant Orders   DG Cervical Spine 2 or 3 views   Fatigue, unspecified type          Xray of the cervical/thoracic spines. Discussed importance of stress reduction and taking time for herself.  Meds ordered this encounter  Medications   clonazePAM (KLONOPIN) 0.5 MG tablet    Sig: Take 1/2-1 tab po BID prn extreme anxiety    Dispense:  30 tablet    Refill:  0    Order Specific Question:   Supervising Provider    Answer:   Lilyan Punt A [[id number]]   escitalopram (LEXAPRO) 10 MG tablet    Sig: Take 1 tablet (10 mg total) by mouth daily.    Dispense:  30 tablet    Refill:  0    Order Specific Question:   Supervising Provider    Answer:   Lilyan Punt A [[id number]]   Discussed options. Start daily Lexapro. Reviewed potential adverse effects. DC med and contact office if any problems. Use Klonopin sparingly for extreme anxiety.   Follow Up Instructions: Return in about 1 month (around 08/11/2023). Call back sooner if needed.    I discussed the assessment and treatment plan with the patient. The patient was provided an opportunity to ask questions and all were answered. The patient agreed with the plan and demonstrated an understanding of the instructions.   The patient was advised to call back or seek an in-person evaluation if  the symptoms worsen  or if the condition fails to improve as anticipated.  I provided 25 minutes of non-face-to-face time during this encounter.   Campbell Riches, NP

## 2023-07-14 ENCOUNTER — Ambulatory Visit (HOSPITAL_COMMUNITY)
Admission: RE | Admit: 2023-07-14 | Discharge: 2023-07-14 | Disposition: A | Discharge status: Home / Self Care | Payer: Managed Care, Other (non HMO) | Source: Ambulatory Visit | Attending: Nurse Practitioner | Admitting: Nurse Practitioner

## 2023-07-14 DIAGNOSIS — M549 Dorsalgia, unspecified: Secondary | ICD-10-CM | POA: Diagnosis present

## 2023-07-14 DIAGNOSIS — M542 Cervicalgia: Secondary | ICD-10-CM | POA: Diagnosis present

## 2023-07-14 DIAGNOSIS — G8929 Other chronic pain: Secondary | ICD-10-CM | POA: Diagnosis present

## 2023-07-16 ENCOUNTER — Encounter: Payer: Self-pay | Admitting: Nurse Practitioner

## 2023-07-23 ENCOUNTER — Encounter: Payer: Self-pay | Admitting: Nurse Practitioner

## 2023-07-24 ENCOUNTER — Other Ambulatory Visit: Payer: Self-pay | Admitting: Nurse Practitioner

## 2023-08-03 ENCOUNTER — Encounter: Payer: Self-pay | Admitting: Nurse Practitioner

## 2023-08-06 ENCOUNTER — Encounter: Payer: Self-pay | Admitting: Nurse Practitioner

## 2023-08-06 ENCOUNTER — Ambulatory Visit: Payer: Managed Care, Other (non HMO) | Admitting: Nurse Practitioner

## 2023-08-06 VITALS — BP 108/72 | HR 93 | Temp 98.4°F | Ht 60.0 in | Wt 145.0 lb

## 2023-08-06 DIAGNOSIS — N390 Urinary tract infection, site not specified: Secondary | ICD-10-CM | POA: Insufficient documentation

## 2023-08-06 DIAGNOSIS — F411 Generalized anxiety disorder: Secondary | ICD-10-CM | POA: Diagnosis not present

## 2023-08-06 DIAGNOSIS — R319 Hematuria, unspecified: Secondary | ICD-10-CM | POA: Insufficient documentation

## 2023-08-06 DIAGNOSIS — R109 Unspecified abdominal pain: Secondary | ICD-10-CM | POA: Diagnosis not present

## 2023-08-06 DIAGNOSIS — F430 Acute stress reaction: Secondary | ICD-10-CM

## 2023-08-06 LAB — POCT URINALYSIS DIP (CLINITEK)
Bilirubin, UA: NEGATIVE
Blood, UA: NEGATIVE
Glucose, UA: NEGATIVE mg/dL
Ketones, POC UA: NEGATIVE mg/dL
Leukocytes, UA: NEGATIVE
Nitrite, UA: NEGATIVE
POC PROTEIN,UA: NEGATIVE
Spec Grav, UA: <=1.005 — AB (ref 1.010–1.025)
Urobilinogen, UA: 0.2 U/dL
pH, UA: 6 (ref 5.0–8.0)

## 2023-08-06 MED ORDER — ALPRAZOLAM 0.5 MG PO TABS: 0.5000 mg | ORAL_TABLET | Freq: Two times a day (BID) | ORAL | 0 refills | Status: DC | PRN

## 2023-08-06 MED ORDER — IBUPROFEN 800 MG PO TABS: 800.0000 mg | ORAL_TABLET | Freq: Three times a day (TID) | ORAL | 0 refills | Status: DC | PRN

## 2023-08-06 MED ORDER — SERTRALINE HCL 50 MG PO TABS: 50.0000 mg | ORAL_TABLET | Freq: Every day | ORAL | 0 refills | Status: DC

## 2023-08-06 NOTE — Progress Notes (Unsigned)
Subjective:    Patient ID: Rebecca [name], female    DOB: 1986-05-10, 37 y.o.   MRN: [id number]  HPI Right flank pain, dark yellow urine, clear vaginal discharge Findings at urgent care - blood in urine- patient reacted to antibiotics prescribed so she stopped them Was seen at local urgent care 4 days ago.  Was told she had blood in her urine.  She thinks it did urine culture but does not know the results at this time.  States they did a pelvic exam with a swab and an x-ray of her right side where she is having the pain.  Same sexual partner.  Clear vaginal discharge.  No dysuria.  Some urgency and frequency.  No incontinence.  No fever.  No injury to the right flank area.  No rash has been noted.  Husband has had a vasectomy.  States she was on clindamycin for several days.  The odor in her urine is gone.  Has stopped drinking [employer]eps[employer] which she thinks has helped.  According to patient she has had 5 UTIs in the past year.  Note that patient contacted the office after the visit and notified us that according to the urgent care center she had a negative urine culture. In addition patient remains under tremendous stress including working and taking care of her mother who has dementia.  Had side effects with the Lexapro including extreme nightmares so she stopped medication.  Her mother is on sertraline so she would like to consider this medication.  Also no improvement with clonazepam.      08/06/2023   11:28 AM  Depression screen PHQ 2/9  Decreased Interest 0  Down, Depressed, Hopeless 0  PHQ - 2 Score 0  Altered sleeping 2  Tired, decreased energy 2  Change in appetite 0  Feeling bad or failure about yourself  0  Trouble concentrating 0  Moving slowly or fidgety/restless 0  Suicidal thoughts 0  PHQ-9 Score 4  Difficult doing work/chores Not difficult at all      08/06/2023   11:28 AM 07/11/2023    3:34 PM 03/12/2023   11:01 AM 01/10/2023   11:49 AM  GAD 7 : Generalized Anxiety Score   Nervous, Anxious, on Edge 2 2 0 0  Control/stop worrying 1 3 0 0  Worry too much - different things 1 2 0 0  Trouble relaxing 0 3 1 0  Restless 0 2 0 0  Easily annoyed or irritable 0 1 0 0  Afraid - awful might happen 0 2 0 0  Total GAD 7 Score 4 15 1  0  Anxiety Difficulty Not difficult at all Not difficult at all Not difficult at all       Review of Systems  Constitutional:  Negative for fever.  Respiratory:  Negative for cough, chest tightness and shortness of breath.   Cardiovascular:  Negative for chest pain.  Genitourinary:  Positive for flank pain, frequency and urgency. Negative for dysuria, enuresis, genital sores and vaginal discharge.  Psychiatric/Behavioral:  Positive for sleep disturbance. Negative for suicidal ideas. The patient is nervous/anxious.        Objective:   Physical Exam NAD.  Alert, oriented.  Lungs clear.  Heart regular rate rhythm.  Positive right CVA/mid flank tenderness radiating into the right mid abdomen.  Abdominal exam minimal lower abdominal tenderness on exam.  No obvious masses.  No rebound or guarding. Results for orders placed or performed in visit on 08/06/23  POCT URINALYSIS DIP (  CLINITEK)  Result Value Ref Range   Color, UA yellow yellow   Clarity, UA clear clear   Glucose, UA negative negative mg/dL   Bilirubin, UA negative negative   Ketones, POC UA negative negative mg/dL   Spec Grav, UA <=0.272 (A) 1.010 - 1.025   Blood, UA negative negative   pH, UA 6.0 5.0 - 8.0   POC PROTEIN,UA negative negative, trace   Urobilinogen, UA 0.2 0.2 or 1.0 E.U./dL   Nitrite, UA Negative Negative   Leukocytes, UA Negative Negative   Today's Vitals   08/06/23 1109  BP: 108/72  Pulse: 93  Temp: 98.4 F (36.9 C)  SpO2: 97%  Weight: 145 lb (65.8 kg)  Height: 5' (1.524 m)   Body mass index is 28.32 kg/m.        Assessment & Plan:   Problem List Items Addressed This Visit       Genitourinary   Recurrent UTI     Other   Anxiety  as acute reaction to exceptional stress   Relevant Medications   sertraline (ZOLOFT) 50 MG tablet   ALPRAZolam (XANAX) 0.5 MG tablet   Hematuria   Relevant Orders   CT RENAL STONE STUDY   Right flank pain - Primary   Relevant Orders   POCT URINALYSIS DIP (CLINITEK) (Completed)   CT RENAL STONE STUDY   Meds ordered this encounter  Medications   sertraline (ZOLOFT) 50 MG tablet    Sig: Take 1 tablet (50 mg total) by mouth daily.    Dispense:  30 tablet    Refill:  0    Order Specific Question:   Supervising Provider    Answer:   Lilyan Punt A [[id number]]   ALPRAZolam (XANAX) 0.5 MG tablet    Sig: Take 1 tablet (0.5 mg total) by mouth 2 (two) times daily as needed for anxiety.    Dispense:  30 tablet    Refill:  0    Order Specific Question:   Supervising Provider    Answer:   Lilyan Punt A [[id number]]   ibuprofen (ADVIL) 800 MG tablet    Sig: Take 1 tablet (800 mg total) by mouth every 8 (eight) hours as needed. Take with food to prevent GI upset    Dispense:  30 tablet    Refill:  0    Order Specific Question:   Supervising Provider    Answer:   Lilyan Punt A [[id number]]   CT scan renal stone protocol for recent hematuria and persistent CVA/flank pain. Ibuprofen as directed for pain. Warning signs reviewed.  Go to ED or urgent care if new or worsening symptoms. Start sertraline 50 mg half tab p.o. daily then if needed and tolerated increase to 1 tab.  Reviewed potential side effects.  Discontinue medication and contact office if any problems. Switched to low-dose alprazolam for breakthrough severe anxiety symptoms.  Use sparingly. Return in about 1 month (around 09/05/2023) for virtual is fine for recheck.

## 2023-08-06 NOTE — Patient Instructions (Addendum)
Floragen Activia yogurt Start with 1/2 tab Sertaline (25 mg) total once daily for a week or two then if everything is good, increase to one tab

## 2023-08-07 ENCOUNTER — Encounter: Payer: Self-pay | Admitting: Nurse Practitioner

## 2023-08-08 ENCOUNTER — Ambulatory Visit (HOSPITAL_COMMUNITY)
Admission: RE | Admit: 2023-08-08 | Discharge: 2023-08-08 | Disposition: A | Discharge status: Home / Self Care | Payer: Managed Care, Other (non HMO) | Source: Ambulatory Visit | Attending: Nurse Practitioner | Admitting: Nurse Practitioner

## 2023-08-08 DIAGNOSIS — R319 Hematuria, unspecified: Secondary | ICD-10-CM | POA: Diagnosis present

## 2023-08-08 DIAGNOSIS — R109 Unspecified abdominal pain: Secondary | ICD-10-CM | POA: Diagnosis present

## 2023-08-13 ENCOUNTER — Encounter: Payer: Self-pay | Admitting: Nurse Practitioner

## 2023-08-13 ENCOUNTER — Other Ambulatory Visit: Payer: Self-pay | Admitting: Nurse Practitioner

## 2023-08-13 ENCOUNTER — Telehealth: Payer: Self-pay

## 2023-08-13 MED ORDER — SIMVASTATIN 20 MG PO TABS: ORAL_TABLET | ORAL | 1 refills | Status: DC

## 2023-08-13 NOTE — Telephone Encounter (Signed)
Prescription Request  08/13/2023  LOV: Visit date not found  What is the name of the medication or equipment? simvastatin (ZOCOR) 20 MG tablet   Have you contacted your pharmacy to request a refill? Yes   Which pharmacy would you like this sent to?  [id number] - [place], [place] - [address] [place] [address] Phone: [phone] Fax: [phone]    Patient notified that their request is being sent to the clinical staff for review and that they should receive a response within 2 business days.   Please advise at Mobile [phone] (mobile)

## 2023-08-14 ENCOUNTER — Ambulatory Visit: Payer: Managed Care, Other (non HMO) | Admitting: Nurse Practitioner

## 2023-08-14 VITALS — BP 127/86 | HR 80 | Temp 98.4°F | Ht 60.0 in | Wt 145.0 lb

## 2023-08-14 DIAGNOSIS — R300 Dysuria: Secondary | ICD-10-CM

## 2023-08-14 DIAGNOSIS — B9689 Other specified bacterial agents as the cause of diseases classified elsewhere: Secondary | ICD-10-CM | POA: Diagnosis not present

## 2023-08-14 DIAGNOSIS — N760 Acute vaginitis: Secondary | ICD-10-CM | POA: Diagnosis not present

## 2023-08-14 DIAGNOSIS — R350 Frequency of micturition: Secondary | ICD-10-CM | POA: Diagnosis not present

## 2023-08-14 DIAGNOSIS — N3281 Overactive bladder: Secondary | ICD-10-CM

## 2023-08-14 LAB — POCT URINALYSIS DIP (CLINITEK)
Bilirubin, UA: NEGATIVE
Blood, UA: NEGATIVE
Glucose, UA: NEGATIVE mg/dL
Ketones, POC UA: NEGATIVE mg/dL
Leukocytes, UA: NEGATIVE
Nitrite, UA: NEGATIVE
POC PROTEIN,UA: NEGATIVE
Spec Grav, UA: 1.015 (ref 1.010–1.025)
Urobilinogen, UA: 0.2 U/dL
pH, UA: 6.5 (ref 5.0–8.0)

## 2023-08-14 MED ORDER — METRONIDAZOLE 0.75 % VA GEL: VAGINAL | 0 refills | Status: DC

## 2023-08-14 MED ORDER — OXYBUTYNIN CHLORIDE ER 10 MG PO TB24: 10.0000 mg | ORAL_TABLET | Freq: Every day | ORAL | 1 refills | Status: DC

## 2023-08-14 NOTE — Progress Notes (Signed)
Subjective:    Patient ID: Rebecca [name], female    DOB: [birth year], 37 y.o.   MRN: [id number]  HPI Pt comes in today for possible UTI, Frequent urination, lower back pain, no pain when urinating. started approx 2 weeks ago See previous note 08/06/2023.  Patient had a CT scan for renal stones on 08/08/2023, results have not been posted.  Patient went to local urgent care where she was seen initially and was told that her urine culture was negative.  Report was not available during visit.  Was also told that she had bacterial vaginosis, has not been treated.  Married, same sexual partner.  Defers STD testing.  No vaginal discharge or pelvic pain. Has a history of overactive bladder.  Was on Ditropan for years which controlled her symptoms, stopped this at 1 point.  Denies any adverse side effects.  At this time her symptoms have come back.  Voiding as frequently as 5-15 minutes, various amounts of urine produced.  No fevers.  No dysuria. Of note patient is doing well on her sertraline with occasional alprazolam use that was prescribed at last visit.       Objective:   Physical Exam NAD.  Alert, oriented.  Lungs clear.  Heart regular rate rhythm.  No CVA or flank tenderness.  Abdomen soft nondistended with mild suprapubic pressure with palpation.  No rebound or guarding.  No obvious mass. CT scan for renal stones pending. Results for orders placed or performed in visit on 08/14/23  POCT URINALYSIS DIP (CLINITEK)  Result Value Ref Range   Color, UA yellow yellow   Clarity, UA clear clear   Glucose, UA negative negative mg/dL   Bilirubin, UA negative negative   Ketones, POC UA negative negative mg/dL   Spec Grav, UA 2.440 1.027 - 1.025   Blood, UA negative negative   pH, UA 6.5 5.0 - 8.0   POC PROTEIN,UA negative negative, trace   Urobilinogen, UA 0.2 0.2 or 1.0 E.U./dL   Nitrite, UA Negative Negative   Leukocytes, UA Negative Negative   Today's Vitals   08/14/23 1603  BP: 127/86   Pulse: 80  Temp: 98.4 F (36.9 C)  SpO2: 99%  Weight: 145 lb (65.8 kg)  Height: 5' (1.524 m)   Body mass index is 28.32 kg/m.        Assessment & Plan:   Problem List Items Addressed This Visit       Genitourinary   OAB (overactive bladder) - Primary   Other Visit Diagnoses     Urinary frequency       Relevant Orders   POCT URINALYSIS DIP (CLINITEK) (Completed)   Bacterial vaginosis          Meds ordered this encounter  Medications   metroNIDAZOLE (METROGEL) 0.75 % vaginal gel    Sig: Apply vaginally at bedtime x 5 days    Dispense:  70 g    Refill:  0    Order Specific Question:   Supervising Provider    Answer:   Lilyan Punt A [[id number]]   oxybutynin (DITROPAN-XL) 10 MG 24 hr tablet    Sig: Take 1 tablet (10 mg total) by mouth at bedtime. For overactive bladder    Dispense:  90 tablet    Refill:  1    Order Specific Question:   Supervising Provider    Answer:   Lilyan Punt A [[id number]]   With recent negative urine culture and UA being normal today, will restart overactive bladder  treatment.  Start Ditropan daily as directed.  Prescribed MetroGel as directed for BV. CT scan results pending. Call back next week if no improvement, sooner if new or worsening symptoms.

## 2023-08-15 ENCOUNTER — Encounter: Payer: Self-pay | Admitting: Nurse Practitioner

## 2023-08-19 ENCOUNTER — Encounter: Payer: Self-pay | Admitting: Nurse Practitioner

## 2023-08-19 ENCOUNTER — Encounter: Payer: Self-pay | Admitting: Adult Health

## 2023-08-19 ENCOUNTER — Ambulatory Visit: Payer: Managed Care, Other (non HMO) | Admitting: Adult Health

## 2023-08-19 VITALS — BP 117/68 | HR 68 | Ht 60.0 in | Wt 147.0 lb

## 2023-08-19 DIAGNOSIS — R3915 Urgency of urination: Secondary | ICD-10-CM | POA: Insufficient documentation

## 2023-08-19 DIAGNOSIS — Z3202 Encounter for pregnancy test, result negative: Secondary | ICD-10-CM | POA: Diagnosis not present

## 2023-08-19 DIAGNOSIS — N816 Rectocele: Secondary | ICD-10-CM | POA: Insufficient documentation

## 2023-08-19 DIAGNOSIS — R102 Pelvic and perineal pain: Secondary | ICD-10-CM | POA: Diagnosis not present

## 2023-08-19 DIAGNOSIS — R309 Painful micturition, unspecified: Secondary | ICD-10-CM | POA: Insufficient documentation

## 2023-08-19 LAB — POCT URINALYSIS DIPSTICK
Blood, UA: NEGATIVE
Glucose, UA: NEGATIVE
Ketones, UA: NEGATIVE
Nitrite, UA: NEGATIVE
Protein, UA: NEGATIVE

## 2023-08-19 LAB — POCT URINE PREGNANCY: Preg Test, Ur: NEGATIVE

## 2023-08-19 NOTE — Progress Notes (Signed)
Subjective:     Patient ID: Rebecca [name], female   DOB: [birth year], 37 y.o.   MRN: [id number]  HPI [name] is a 37 year old white female, married, [id number] in complaining of pain with urination and urgency for a months, has pelvic pressure and low back pain. She was treated at Urgent Care for UTI and has seen PCP, had renal CT 08/08/23 but it has not been read yet (call PCP about this).     Component Value Date/Time   DIAGPAP  01/10/2023 1139    - Negative for Intraepithelial Lesions or Malignancy (NILM)   DIAGPAP - Benign reactive/reparative changes 01/10/2023 1139   DIAGPAP  06/06/2020 1051    - Negative for intraepithelial lesion or malignancy (NILM)   HPVHIGH Negative 01/10/2023 1139   HPVHIGH Negative 06/06/2020 1051   ADEQPAP  01/10/2023 1139    Satisfactory for evaluation; transformation zone component PRESENT.   ADEQPAP  06/06/2020 1051    Satisfactory for evaluation; transformation zone component PRESENT.   ADEQPAP  07/07/2017 0000    Satisfactory for evaluation  endocervical/transformation zone component PRESENT.    PCP is Dr Adriana Simas  Review of Systems Pain with urination  Has urinary urgency for a month,  Has pelvic pressure Feels heavy and low back pain Reviewed past medical,surgical, social and family history. Reviewed medications and allergies.     Objective:   Physical Exam BP 117/68 (BP Location: Left Arm, Patient Position: Sitting, Cuff Size: Normal)   Pulse 68   Ht 5' (1.524 m)   Wt 147 lb (66.7 kg)   BMI 28.71 kg/m  urine dipstick 3+ leuks. UPT is negative    Skin warm and dry.Pelvic: external genitalia is normal in appearance no lesions, vagina: pink, feels pressure near introitus,urethra has no lesions or masses noted, cervix:smooth and bulbous, uterus: normal size, shape and contour, non tender, no masses felt, adnexa: no masses or tenderness noted. Bladder is non tender and no masses felt. On rectal exam +rectocele. No CVAT  Fall risk is low  Upstream  - 08/19/23 1059       Pregnancy Intention Screening   Does the patient want to become pregnant in the next year? No    Does the patient's partner want to become pregnant in the next year? No    Would the patient like to discuss contraceptive options today? No      Contraception Wrap Up   Current Method Vasectomy    End Method Vasectomy    Contraception Counseling Provided No            Examination chaperoned by Malachy Mood LPN  Assessment:     1. Pain with urination Has pyridium at home to start taking UA C&S sent to rule out UTI  - POCT Urinalysis Dipstick - Urine Culture - Urinalysis, Routine w reflex microscopic  2. Urinary urgency +urgency for a month  - POCT Urinalysis Dipstick - Urine Culture - Urinalysis, Routine w reflex microscopic  3. Pregnancy examination or test, negative result - POCT urine pregnancy  4. Pelvic pressure in female +pressure Will get pelvic US to assess in office  - US PELVIC COMPLETE WITH TRANSVAGINAL; Future  5. Rectocele     Plan:    Follow up with PCP about renal CT  Return in 10 days for GYN [place] in office

## 2023-08-23 LAB — URINE CULTURE

## 2023-08-25 ENCOUNTER — Other Ambulatory Visit: Payer: Self-pay | Admitting: Adult Health

## 2023-08-25 MED ORDER — DOXYCYCLINE HYCLATE 100 MG PO TABS: 100.0000 mg | ORAL_TABLET | Freq: Two times a day (BID) | ORAL | 0 refills | Status: DC

## 2023-08-25 NOTE — Progress Notes (Signed)
Rx doxycycline

## 2023-08-27 ENCOUNTER — Telehealth: Payer: Self-pay | Admitting: Family Medicine

## 2023-08-27 ENCOUNTER — Other Ambulatory Visit: Payer: Self-pay | Admitting: Nurse Practitioner

## 2023-08-27 NOTE — Telephone Encounter (Signed)
Prescription Request  08/27/2023  LOV: 03/12/2023  What is the name of the medication or equipment? Escitalopram 10 mg tablets  Have you contacted your pharmacy to request a refill? Yes   Which pharmacy would you like this sent to?  [id number] - [place], [place] - [address] [place] [address] Phone: [phone] Fax: [phone]    Patient notified that their request is being sent to the clinical staff for review and that they should receive a response within 2 business days.   Please advise at [facility] [phone]

## 2023-08-28 ENCOUNTER — Other Ambulatory Visit: Payer: Self-pay | Admitting: Family Medicine

## 2023-08-28 NOTE — Telephone Encounter (Signed)
Prescription Request  08/28/2023  LOV: 03/12/2023  What is the name of the medication or equipment? Meloxicam 15 mg tablets  Have you contacted your pharmacy to request a refill? Yes   Which pharmacy would you like this sent to?  [id number] - [place], [place] - [address] [place] [address] Phone: [phone] Fax: [phone]    Patient notified that their request is being sent to the clinical staff for review and that they should receive a response within 2 business days.   Please advise at [facility] [phone]

## 2023-08-29 ENCOUNTER — Ambulatory Visit (INDEPENDENT_AMBULATORY_CARE_PROVIDER_SITE_OTHER): Payer: Managed Care, Other (non HMO) | Admitting: Radiology

## 2023-08-29 DIAGNOSIS — R102 Pelvic and perineal pain: Secondary | ICD-10-CM

## 2023-08-29 NOTE — Progress Notes (Signed)
Patient ID: Rebecca [name], female   DOB: 1986-08-10, 37 y.o.   MRN: [id number]  Pelvic US TA/TV  normal uterine size,  heterogenous anteverted uterus Ovaries appear atrophic and normal.  Ovaries appear mobile - neg adnexa Neg CDS - No Free Fluid    P. Sopheap Boehle, RDMS

## 2023-09-01 MED ORDER — MELOXICAM 15 MG PO TABS: 15.0000 mg | ORAL_TABLET | Freq: Every day | ORAL | 0 refills | Status: DC

## 2023-09-08 ENCOUNTER — Other Ambulatory Visit: Payer: Self-pay | Admitting: Nurse Practitioner

## 2023-09-08 MED ORDER — OXYBUTYNIN CHLORIDE ER 10 MG PO TB24: 10.0000 mg | ORAL_TABLET | Freq: Every day | ORAL | 1 refills | Status: DC

## 2023-09-08 MED ORDER — ALPRAZOLAM 0.5 MG PO TABS: 0.5000 mg | ORAL_TABLET | Freq: Two times a day (BID) | ORAL | 0 refills | Status: DC | PRN

## 2023-09-09 ENCOUNTER — Other Ambulatory Visit: Payer: Self-pay

## 2023-09-09 MED ORDER — SERTRALINE HCL 50 MG PO TABS: 50.0000 mg | ORAL_TABLET | Freq: Every day | ORAL | 0 refills | Status: DC

## 2023-09-23 ENCOUNTER — Encounter: Payer: Self-pay | Admitting: Dermatology

## 2023-09-23 ENCOUNTER — Ambulatory Visit: Payer: Managed Care, Other (non HMO) | Admitting: Dermatology

## 2023-09-23 VITALS — BP 120/86 | HR 87

## 2023-09-23 DIAGNOSIS — B078 Other viral warts: Secondary | ICD-10-CM

## 2023-09-23 DIAGNOSIS — D485 Neoplasm of uncertain behavior of skin: Secondary | ICD-10-CM

## 2023-09-23 DIAGNOSIS — D492 Neoplasm of unspecified behavior of bone, soft tissue, and skin: Secondary | ICD-10-CM

## 2023-09-23 NOTE — Patient Instructions (Addendum)

## 2023-09-23 NOTE — Progress Notes (Signed)
   New Patient Visit   Subjective  Rebecca [name] is a 37 y.o. female who presents for the following: Spot Check  Patient states she has spot located at the LLE that she would like to have examined. Patient reports the areas have been there for several years (Since 37 YO). She reports the areas are not bothersome. Patient rates irritation 0 out of 10. She states that the areas have not spread. Patient reports she has not previously been treated for these areas. Patient denies Hx of bx. Patient denies family history of skin cancer(s).  The patient has spots, moles and lesions to be evaluated, some may be new or changing and the patient may have concern these could be cancer.  The following portions of the chart were reviewed this encounter and updated as appropriate: medications, allergies, medical history  Review of Systems:  No other skin or systemic complaints except as noted in HPI or Assessment and Plan.  Objective  Well appearing patient in no apparent distress; mood and affect are within normal limits.  A focused examination was performed of the following areas: LLE  Relevant exam findings are noted in the Assessment and Plan.     Assessment & Plan    Neoplasm of uncertain behavior of skin- Ddx DF vs wart vs SK vs other Left Lower Leg - Anterior  Skin / nail biopsy  Informed consent: discussed and consent obtained   Timeout: patient name, date of birth, surgical site, and procedure verified   Hemostasis achieved with: aluminum chloride   Outcome: patient tolerated procedure well    Specimen 1 - Surgical pathology Differential Diagnosis: Dermatofibroma  Check Margins: No  Biopsy (shave technique) Procedure Note: Number of shave biopsies performed: 1  After verbal consent was obtained, the area was marked, photographed, prepped with alcohol, and anesthetized with 2% lidocaine with epinephrine. Biopsy was performed using a shave technique and hemostasis was achieved  using aluminum chloride. The area was then dressed with petroleum jelly and a bandage. The patient was instructed on wound care and given a handout with this information. The patient will be notified of the pathology results.  Specimen A Location: left lower leg DDX: DF vs wart vs SK vs other   Return if symptoms worsen or fail to improve.   Documentation: I have reviewed the above documentation for accuracy and completeness, and I agree with the above.  I, Nell Range, am acting as scribe for Gwenith Daily, MD.  Gwenith Daily, MD

## 2023-09-24 LAB — SURGICAL PATHOLOGY

## 2023-10-01 ENCOUNTER — Other Ambulatory Visit: Payer: Self-pay | Admitting: Family Medicine

## 2023-10-07 NOTE — Telephone Encounter (Signed)
Message sent to patient via mychart

## 2023-10-25 ENCOUNTER — Other Ambulatory Visit: Payer: Self-pay

## 2023-10-25 ENCOUNTER — Ambulatory Visit: Payer: Managed Care, Other (non HMO)

## 2023-10-25 ENCOUNTER — Ambulatory Visit
Admission: EM | Admit: 2023-10-25 | Discharge: 2023-10-25 | Disposition: A | Discharge status: Home / Self Care | Payer: Managed Care, Other (non HMO) | Attending: Nurse Practitioner | Admitting: Nurse Practitioner

## 2023-10-25 ENCOUNTER — Encounter: Payer: Self-pay | Admitting: Emergency Medicine

## 2023-10-25 DIAGNOSIS — R0789 Other chest pain: Secondary | ICD-10-CM

## 2023-10-25 DIAGNOSIS — M549 Dorsalgia, unspecified: Secondary | ICD-10-CM | POA: Diagnosis not present

## 2023-10-25 NOTE — ED Triage Notes (Signed)
Mid back pain with wheezing and bilateral ear pain since last night.

## 2023-10-25 NOTE — ED Provider Notes (Signed)
[facility]    CSN: [id number] Arrival date & time: 10/25/23  1435      History   Chief Complaint Chief Complaint  Patient presents with   Wheezing   Otalgia    HPI Rebecca [name] is a 37 y.o. female.   The history is provided by the patient.   Patient presents for complaints of mid back pain with wheezing, and bilateral ear pain that started over the past 24 hours.  Patient denies fever, chills, headache, nasal congestion, runny nose, or cough.  Patient states that she also has a "tingling" sensation in her back that has moved from side-to-side.  She also reports that she feels a "pressure" in her lungs. Patient reports that she does currently smoke, smokes 1 pack/day, states she has not taken any medication for her symptoms.  Past Medical History:  Diagnosis Date   Anxiety    Back pain    GERD (gastroesophageal reflux disease)    Hyperlipidemia    Smoker 05/14/2016    Patient Active Problem List   Diagnosis Date Noted   Rectocele 08/19/2023   Recurrent UTI 08/06/2023   Hidradenitis suppurativa 05/23/2023   Plantar fasciitis of left foot 03/12/2023   Encounter for removal and reinsertion of Nexplanon 01/14/2023   Irritable bowel syndrome with both constipation and diarrhea 08/20/2021   OAB (overactive bladder) 04/25/2020   Dyslipidemia (high LDL; low HDL) 09/17/2019   Smoker 05/14/2016   Depression with anxiety 04/05/2013    Past Surgical History:  Procedure Laterality Date   NO PAST SURGERIES      OB History     Gravida  3   Para  3   Term  2   Preterm  1   AB      Living  3      SAB      IAB      Ectopic      Multiple      Live Births  3            Home Medications    Prior to Admission medications   Medication Sig Start Date End Date Taking? Authorizing Provider  ALPRAZolam Prudy Feeler) 0.5 MG tablet Take 1 tablet (0.5 mg total) by mouth 2 (two) times daily as needed for anxiety. 09/08/23   Tommie Sams, DO   doxycycline (VIBRA-TABS) 100 MG tablet Take 1 tablet (100 mg total) by mouth 2 (two) times daily. 08/25/23   Adline Potter, NP  ibuprofen (ADVIL) 800 MG tablet Take 1 tablet (800 mg total) by mouth every 8 (eight) hours as needed. Take with food to prevent GI upset 08/06/23   Campbell Riches, NP  meloxicam (MOBIC) 15 MG tablet TAKE 1 TABLET(15 MG) BY MOUTH DAILY 10/09/23   Tommie Sams, DO  Multiple Vitamin (MULTIVITAMIN) tablet Take 1 tablet by mouth daily.    [provider]  omeprazole (PRILOSEC) 20 MG capsule TAKE 1 CAPSULE BY MOUTH DAILY 04/07/23   Cyril Mourning A, NP  oxybutynin (DITROPAN-XL) 10 MG 24 hr tablet Take 1 tablet (10 mg total) by mouth at bedtime. For overactive bladder 09/08/23   Tommie Sams, DO  simvastatin (ZOCOR) 20 MG tablet Take 1 tablet by mouth daily. 08/13/23   Campbell Riches, NP    Family History Family History  Problem Relation Age of Onset   Cancer Maternal Grandmother        lung   Cancer Maternal Grandfather  unsure    CAD Father    Stroke Father    Liver disease Father    Alcohol abuse Father    Other Mother        brain tumor   Dementia Mother    Anxiety disorder Sister    Other Daughter        acid reflux   Asthma Son    Bronchitis Son    ADD / ADHD Son     Social History Social History   Tobacco Use   Smoking status: Every Day    Current packs/day: 0.50    Average packs/day: 0.5 packs/day for 15.0 years (7.5 ttl pk-yrs)    Types: Cigarettes    Passive exposure: Current   Smokeless tobacco: Never  Vaping Use   Vaping status: Never Used  Substance Use Topics   Alcohol use: No   Drug use: Never    Types: Hydrocodone     Allergies   Amoxicillin, Cephalosporins, Septra [bactrim], Ciprocinonide [fluocinolone], Macrobid [nitrofurantoin], and Penicillins   Review of Systems Review of Systems Per HPI  Physical Exam Triage Vital Signs ED Triage Vitals  Encounter Vitals Group     BP 10/25/23 1443  126/83     Systolic BP Percentile --      Diastolic BP Percentile --      Pulse Rate 10/25/23 1443 85     Resp 10/25/23 1443 18     Temp 10/25/23 1443 97.8 F (36.6 C)     Temp Source 10/25/23 1443 Oral     SpO2 10/25/23 1443 97 %     Weight --      Height --      Head Circumference --      Peak Flow --      Pain Score 10/25/23 1445 5     Pain Loc --      Pain Education --      Exclude from Growth Chart --    No data found.  Updated Vital Signs BP 126/83 (BP Location: Right Arm)   Pulse 85   Temp 97.8 F (36.6 C) (Oral)   Resp 18   SpO2 97%   Visual Acuity Right Eye Distance:   Left Eye Distance:   Bilateral Distance:    Right Eye Near:   Left Eye Near:    Bilateral Near:     Physical Exam Vitals and nursing note reviewed.  Constitutional:      General: She is not in acute distress.    Appearance: Normal appearance.  HENT:     Head: Normocephalic.     Right Ear: Tympanic membrane, ear canal and external ear normal.     Left Ear: Tympanic membrane, ear canal and external ear normal.     Nose: Nose normal.     Mouth/Throat:     Mouth: Mucous membranes are moist.  Eyes:     Extraocular Movements: Extraocular movements intact.     Conjunctiva/sclera: Conjunctivae normal.     Pupils: Pupils are equal, round, and reactive to light.  Cardiovascular:     Rate and Rhythm: Normal rate and regular rhythm.     Pulses: Normal pulses.     Heart sounds: Normal heart sounds.  Pulmonary:     Effort: Pulmonary effort is normal.     Breath sounds: Normal breath sounds.  Abdominal:     General: Bowel sounds are normal.     Palpations: Abdomen is soft.     Tenderness: There is no abdominal  tenderness.  Musculoskeletal:     Cervical back: Normal range of motion.  Lymphadenopathy:     Cervical: No cervical adenopathy.  Skin:    General: Skin is warm and dry.  Neurological:     General: No focal deficit present.     Mental Status: She is alert and oriented to person,  place, and time.  Psychiatric:        Mood and Affect: Mood normal.        Behavior: Behavior normal.      UC Treatments / Results  Labs (all labs ordered are listed, but only abnormal results are displayed) Labs Reviewed - No data to display  EKG: Normal sinus rhythm, no STEMI.  No other comparisons available.   Radiology DG Chest 2 View  Result Date: 10/25/2023 CLINICAL DATA:  Wheezing, cough, shortness of breath. EXAM: CHEST - 2 VIEW COMPARISON:  Chest radiograph dated 02/21/2016. FINDINGS: The heart size and mediastinal contours are within normal limits. Both lungs are clear. The visualized skeletal structures are unremarkable. IMPRESSION: No active cardiopulmonary disease. Electronically Signed   By: Romona Curls M.D.   On: 10/25/2023 15:32    Procedures Procedures (including critical care time)  Medications Ordered in UC Medications - No data to display  Initial Impression / Assessment and Plan / UC Course  I have reviewed the triage vital signs and the nursing notes.  Pertinent labs & imaging results that were available during my care of the patient were reviewed by me and considered in my medical decision making (see chart for details).  The chest x-ray was normal, EKG was also normal.  Difficult to ascertain the cause of the abnormal feeling that the patient is experiencing in her lungs and in her mid back.  On exam, lung sounds are clear throughout, O2 sat is at 97%.  Supportive care recommendations were provided and discussed with the patient to include use of over-the-counter analgesics, and to work on cutting back on smoking to see if this may impact her symptoms.  Patient was given strict ER follow-up precautions.  Patient was in agreement with this plan of care and verbalized understanding.  All questions were answered.  Patient stable for discharge.  Final Clinical Impressions(s) / UC Diagnoses   Final diagnoses:  Mid back pain  Sensation of chest pressure      Discharge Instructions      The EKG and chest x-ray were normal. May take over-the-counter Tylenol or ibuprofen as needed for pain or discomfort. Try to work on cutting back on your smoking as this has an impact on your lungs and your breathing. If your symptoms worsen to include worsening wheezing, shortness of breath, or difficulty breathing, you may go to the emergency department for further evaluation. Follow-up as needed.     ED Prescriptions   None    PDMP not reviewed this encounter.   Abran Cantor, NP 10/25/23 [phone]

## 2023-10-25 NOTE — Discharge Instructions (Signed)
The EKG and chest x-ray were normal. May take over-the-counter Tylenol or ibuprofen as needed for pain or discomfort. Try to work on cutting back on your smoking as this has an impact on your lungs and your breathing. If your symptoms worsen to include worsening wheezing, shortness of breath, or difficulty breathing, you may go to the emergency department for further evaluation. Follow-up as needed.

## 2023-11-04 ENCOUNTER — Encounter: Payer: Self-pay | Admitting: Family Medicine

## 2023-11-04 ENCOUNTER — Ambulatory Visit: Payer: Managed Care, Other (non HMO) | Admitting: Family Medicine

## 2023-11-04 VITALS — BP 116/80 | HR 90 | Temp 97.7°F | Ht 60.0 in | Wt 144.0 lb

## 2023-11-04 DIAGNOSIS — M7918 Myalgia, other site: Secondary | ICD-10-CM | POA: Diagnosis not present

## 2023-11-04 DIAGNOSIS — N390 Urinary tract infection, site not specified: Secondary | ICD-10-CM | POA: Diagnosis not present

## 2023-11-04 DIAGNOSIS — H6590 Unspecified nonsuppurative otitis media, unspecified ear: Secondary | ICD-10-CM | POA: Insufficient documentation

## 2023-11-04 DIAGNOSIS — H6592 Unspecified nonsuppurative otitis media, left ear: Secondary | ICD-10-CM

## 2023-11-04 LAB — POCT URINALYSIS DIP (CLINITEK)
Bilirubin, UA: NEGATIVE
Blood, UA: NEGATIVE
Glucose, UA: NEGATIVE mg/dL
Ketones, POC UA: NEGATIVE mg/dL
Nitrite, UA: NEGATIVE
POC PROTEIN,UA: NEGATIVE
Spec Grav, UA: <=1.005 — AB (ref 1.010–1.025)
Urobilinogen, UA: 0.2 U/dL
pH, UA: 8.5 — AB (ref 5.0–8.0)

## 2023-11-04 MED ORDER — DOXYCYCLINE HYCLATE 100 MG PO TABS: 100.0000 mg | ORAL_TABLET | Freq: Two times a day (BID) | ORAL | 0 refills | Status: DC

## 2023-11-04 MED ORDER — NORTRIPTYLINE HCL 10 MG PO CAPS: 10.0000 mg | ORAL_CAPSULE | Freq: Every day | ORAL | 0 refills | Status: DC

## 2023-11-04 NOTE — Progress Notes (Signed)
Subjective:  Patient ID: Rebecca [name], female    DOB: 02-Oct-1986  Age: 37 y.o. MRN: [id number]  CC:   Chief Complaint  Patient presents with   Urinary Frequency    Foul smelling urine   left ear pressure    low back pressure and tingling    HPI:  37 year old female presents for evaluation of the above.  Patient reports 2-day history of urinary frequency and odor.  She is concerned that she has UTI.  Has a history of recurrent UTI.  Has multiple allergies to antibiotics.  See allergy list.  Patient also reports ongoing left ear pressure.  She reports recent urgent care visit where she was told she had fluid behind her ear.  She would like me to really examine this today.  Patient also reports tingling sensation to the mid back bilaterally.  She states that this is more of a nuisance than it is a significant pain.  She describes it as a tingling sensation.  She has used anti-inflammatories without relief.  No recent fall, trauma, injury.  Patient Active Problem List   Diagnosis Date Noted   Musculoskeletal pain 11/04/2023   Middle ear effusion 11/04/2023   Rectocele 08/19/2023   Recurrent UTI 08/06/2023   Hidradenitis suppurativa 05/23/2023   Encounter for removal and reinsertion of Nexplanon 01/14/2023   Irritable bowel syndrome with both constipation and diarrhea 08/20/2021   OAB (overactive bladder) 04/25/2020   Dyslipidemia (high LDL; low HDL) 09/17/2019   Smoker 05/14/2016   Depression with anxiety 04/05/2013    Social Hx   Social History   Socioeconomic History   Marital status: Married    Spouse name: Not on file   Number of children: 3   Years of education: Not on file   Highest education level: 9th grade  Occupational History   Not on file  Tobacco Use   Smoking status: Every Day    Current packs/day: 0.50    Average packs/day: 0.5 packs/day for 15.0 years (7.5 ttl pk-yrs)    Types: Cigarettes    Passive exposure: Current   Smokeless tobacco: Never   Vaping Use   Vaping status: Never Used  Substance and Sexual Activity   Alcohol use: No   Drug use: Never    Types: Hydrocodone   Sexual activity: Yes    Birth control/protection: Surgical    Comment: Husband had a vasectomy  Other Topics Concern   Not on file  Social History Narrative   Not on file   Social Determinants of Health   Financial Resource Strain: Low Risk  (01/10/2023)   Overall Financial Resource Strain (CARDIA)    Difficulty of Paying Living Expenses: Not hard at all  Food Insecurity: No Food Insecurity (01/10/2023)   Hunger Vital Sign    Worried About Running Out of Food in the Last Year: Never true    Ran Out of Food in the Last Year: Never true  Transportation Needs: No Transportation Needs (01/10/2023)   PRAPARE - Administrator, Civil Service (Medical): No    Lack of Transportation (Non-Medical): No  Physical Activity: Insufficiently Active (01/10/2023)   Exercise Vital Sign    Days of Exercise per Week: 2 days    Minutes of Exercise per Session: 20 min  Stress: Stress Concern Present (01/10/2023)   [employer] of Occupational Health - Occupational Stress Questionnaire    Feeling of Stress : Rather much  Social Connections: Moderately Isolated (01/10/2023)   Social Connection and  Isolation Panel [NHANES]    Frequency of Communication with Friends and Family: More than three times a week    Frequency of Social Gatherings with Friends and Family: Once a week    Attends Religious Services: 1 to 4 times per year    Active Member of [employer] or Organizations: No    Attends Engineer, structural: Never    Marital Status: Divorced    Review of Systems Per HPI  Objective:  BP 116/80   Pulse 90   Temp 97.7 F (36.5 C)   Ht 5' (1.524 m)   Wt 144 lb (65.3 kg)   SpO2 96%   BMI 28.12 kg/m      11/04/2023   10:22 AM 10/25/2023    2:43 PM 09/23/2023    2:09 PM  BP/Weight  Systolic BP 116 126 120  Diastolic BP 80 83 86  Wt. (Lbs) 144     BMI 28.12 kg/m2      Physical Exam Vitals and nursing note reviewed.  Constitutional:      General: She is not in acute distress.    Appearance: Normal appearance.  HENT:     Head: Normocephalic and atraumatic.     Ears:     Comments: Left TM with effusion. Cardiovascular:     Rate and Rhythm: Normal rate and regular rhythm.  Pulmonary:     Effort: Pulmonary effort is normal.     Breath sounds: Normal breath sounds. No wheezing, rhonchi or rales.  Abdominal:     General: There is no distension.     Palpations: Abdomen is soft.     Tenderness: There is no abdominal tenderness.  Musculoskeletal:     Comments: Thoracic spine with no tenderness to palpation.  Neurological:     Mental Status: She is alert.     Lab Results  Component Value Date   WBC 8.0 07/10/2023   HGB 14.1 07/10/2023   HCT 41.7 07/10/2023   PLT 182 07/10/2023   GLUCOSE 89 07/10/2023   CHOL 146 07/10/2023   TRIG 110 07/10/2023   HDL 33 (L) 07/10/2023   LDLCALC 93 07/10/2023   ALT 31 07/10/2023   AST 19 07/10/2023   NA 141 07/10/2023   K 3.8 07/10/2023   CL 107 (H) 07/10/2023   CREATININE 0.75 07/10/2023   BUN 4 (L) 07/10/2023   CO2 22 07/10/2023   TSH 1.010 07/10/2023     Assessment & Plan:   Problem List Items Addressed This Visit       Nervous and Auditory   Middle ear effusion    Doxy as prescribed.        Genitourinary   Recurrent UTI - Primary    Given symptoms and urinary findings, suspect UTI.  Sending culture.  Placing on doxycycline.  Patient has multiple allergies, and I think this is the best treatment option at this time.      Relevant Orders   POCT URINALYSIS DIP (CLINITEK) (Completed)   Urine Culture     Other   Musculoskeletal pain    Stopping meloxicam.   Trial of nortriptyline.       Meds ordered this encounter  Medications   doxycycline (VIBRA-TABS) 100 MG tablet    Sig: Take 1 tablet (100 mg total) by mouth 2 (two) times daily.    Dispense:  14 tablet     Refill:  0   nortriptyline (PAMELOR) 10 MG capsule    Sig: Take 1 capsule (10 mg total)  by mouth at bedtime.    Dispense:  90 capsule    Refill:  0    Mj Willis DO [facility]

## 2023-11-04 NOTE — Assessment & Plan Note (Signed)
Given symptoms and urinary findings, suspect UTI.  Sending culture.  Placing on doxycycline.  Patient has multiple allergies, and I think this is the best treatment option at this time.

## 2023-11-04 NOTE — Patient Instructions (Signed)
Medications as prescribed. ° °Take care ° °Dr. Allon Costlow  °

## 2023-11-04 NOTE — Assessment & Plan Note (Signed)
Doxy as prescribed.

## 2023-11-04 NOTE — Assessment & Plan Note (Signed)
Stopping meloxicam.   Trial of nortriptyline.

## 2023-11-06 LAB — SPECIMEN STATUS REPORT

## 2023-11-06 LAB — URINE CULTURE

## 2023-11-07 ENCOUNTER — Other Ambulatory Visit: Payer: Self-pay | Admitting: Family Medicine

## 2023-12-09 ENCOUNTER — Other Ambulatory Visit: Payer: Self-pay | Admitting: Nurse Practitioner

## 2023-12-09 ENCOUNTER — Encounter: Payer: Self-pay | Admitting: Family Medicine

## 2023-12-17 ENCOUNTER — Ambulatory Visit
Admission: RE | Admit: 2023-12-17 | Discharge: 2023-12-17 | Disposition: A | Discharge status: Home / Self Care | Payer: Managed Care, Other (non HMO) | Source: Ambulatory Visit | Attending: Family Medicine | Admitting: Family Medicine

## 2023-12-17 VITALS — BP 143/81 | HR 88 | Temp 97.9°F | Resp 18

## 2023-12-17 DIAGNOSIS — J329 Chronic sinusitis, unspecified: Secondary | ICD-10-CM | POA: Diagnosis present

## 2023-12-17 DIAGNOSIS — S39012A Strain of muscle, fascia and tendon of lower back, initial encounter: Secondary | ICD-10-CM | POA: Insufficient documentation

## 2023-12-17 DIAGNOSIS — B9789 Other viral agents as the cause of diseases classified elsewhere: Secondary | ICD-10-CM | POA: Insufficient documentation

## 2023-12-17 DIAGNOSIS — N390 Urinary tract infection, site not specified: Secondary | ICD-10-CM | POA: Insufficient documentation

## 2023-12-17 LAB — POCT URINALYSIS DIP (MANUAL ENTRY)
Bilirubin, UA: NEGATIVE
Glucose, UA: NEGATIVE mg/dL
Ketones, POC UA: NEGATIVE mg/dL
Nitrite, UA: NEGATIVE
Protein Ur, POC: NEGATIVE mg/dL
Spec Grav, UA: 1.01 (ref 1.010–1.025)
Urobilinogen, UA: 0.2 U/dL
pH, UA: 6 (ref 5.0–8.0)

## 2023-12-17 MED ORDER — TIZANIDINE HCL 4 MG PO CAPS: 4.0000 mg | ORAL_CAPSULE | Freq: Three times a day (TID) | ORAL | 0 refills | Status: DC | PRN

## 2023-12-17 MED ORDER — DEXAMETHASONE SODIUM PHOSPHATE 10 MG/ML IJ SOLN
10.0000 mg | Freq: Once | INTRAMUSCULAR | Status: AC
  Administered 2023-12-17: 10 mg via INTRAMUSCULAR

## 2023-12-17 MED ORDER — FLUTICASONE PROPIONATE 50 MCG/ACT NA SUSP
1.0000 | Freq: Two times a day (BID) | NASAL | 2 refills | Status: DC
Start: 2023-12-17 — End: 2024-04-21

## 2023-12-17 NOTE — ED Triage Notes (Signed)
 Facial pressure, ears popping x 2 days  Lower back pain that radiated down left leg x 2 days.  States she is having urinary urgency and frequency.

## 2023-12-17 NOTE — ED Provider Notes (Signed)
 [facility]    CSN: [id number] Arrival date & time: 12/17/23  1048      History   Chief Complaint Chief Complaint  Patient presents with   Nasal Congestion    Pain in lower back going down left leg . Ears hurting . Congested in head . - Entered by patient    HPI Rebecca [name] is a 38 y.o. female.   Facial pressure, ears popping x 2 days   Lower back pain that radiated down left leg x 2 days.  States she is having urinary urgency and frequency.      Past Medical History:  Diagnosis Date   Anxiety    Back pain    GERD (gastroesophageal reflux disease)    Hyperlipidemia    Smoker 05/14/2016    Patient Active Problem List   Diagnosis Date Noted   Musculoskeletal pain 11/04/2023   Middle ear effusion 11/04/2023   Rectocele 08/19/2023   Recurrent UTI 08/06/2023   Hidradenitis suppurativa 05/23/2023   Encounter for removal and reinsertion of Nexplanon  01/14/2023   Irritable bowel syndrome with both constipation and diarrhea 08/20/2021   OAB (overactive bladder) 04/25/2020   Dyslipidemia (high LDL; low HDL) 09/17/2019   Smoker 05/14/2016   Depression with anxiety 04/05/2013    Past Surgical History:  Procedure Laterality Date   NO PAST SURGERIES      OB History     Gravida  3   Para  3   Term  2   Preterm  1   AB      Living  3      SAB      IAB      Ectopic      Multiple      Live Births  3            Home Medications    Prior to Admission medications   Medication Sig Start Date End Date Taking? Authorizing Provider  fluticasone  (FLONASE ) 50 MCG/ACT nasal spray Place 1 spray into both nostrils 2 (two) times daily. 12/17/23  Yes Corbin Dess, PA-C  tiZANidine  (ZANAFLEX ) 4 MG capsule Take 1 capsule (4 mg total) by mouth 3 (three) times daily as needed for muscle spasms. Do not drink alcohol or drive while taking this medication.  May cause drowsiness. 12/17/23  Yes Corbin Dess, PA-C  ALPRAZolam   (XANAX ) 0.5 MG tablet TAKE 1 TABLET(0.5 MG) BY MOUTH TWICE DAILY AS NEEDED FOR ANXIETY 11/07/23   Cook, Jayce G, DO  Multiple Vitamin (MULTIVITAMIN) tablet Take 1 tablet by mouth daily.    [provider]  omeprazole  (PRILOSEC) 20 MG capsule TAKE 1 CAPSULE BY MOUTH DAILY 04/07/23   Lendia Quay A, NP  simvastatin  (ZOCOR ) 20 MG tablet Take 1 tablet by mouth daily. 08/13/23   Derenda Flax, NP    Family History Family History  Problem Relation Age of Onset   Cancer Maternal Grandmother        lung   Cancer Maternal Grandfather        unsure    CAD Father    Stroke Father    Liver disease Father    Alcohol abuse Father    Other Mother        brain tumor   Dementia Mother    Anxiety disorder Sister    Other Daughter        acid reflux   Asthma Son    Bronchitis Son    ADD / ADHD  Son     Social History Social History   Tobacco Use   Smoking status: Every Day    Current packs/day: 0.50    Average packs/day: 0.5 packs/day for 15.0 years (7.5 ttl pk-yrs)    Types: Cigarettes    Passive exposure: Current   Smokeless tobacco: Never  Vaping Use   Vaping status: Never Used  Substance Use Topics   Alcohol use: No   Drug use: Never    Types: Hydrocodone      Allergies   Amoxicillin, Cephalosporins, Septra [bactrim], Ciprocinonide [fluocinolone], Macrobid  [nitrofurantoin ], and Penicillins   Review of Systems Review of Systems PER HPI  Physical Exam Triage Vital Signs ED Triage Vitals  Encounter Vitals Group     BP 12/17/23 1113 (!) 143/81     Systolic BP Percentile --      Diastolic BP Percentile --      Pulse Rate 12/17/23 1113 88     Resp 12/17/23 1113 18     Temp 12/17/23 1113 97.9 F (36.6 C)     Temp Source 12/17/23 1113 Oral     SpO2 12/17/23 1113 98 %     Weight --      Height --      Head Circumference --      Peak Flow --      Pain Score 12/17/23 1114 7     Pain Loc --      Pain Education --      Exclude from Growth Chart --    No  data found.  Updated Vital Signs BP (!) 143/81 (BP Location: Right Arm)   Pulse 88   Temp 97.9 F (36.6 C) (Oral)   Resp 18   LMP 12/14/2023 (Exact Date)   SpO2 98%   Visual Acuity Right Eye Distance:   Left Eye Distance:   Bilateral Distance:    Right Eye Near:   Left Eye Near:    Bilateral Near:     Physical Exam Vitals and nursing note reviewed.  Constitutional:      Appearance: Normal appearance.  HENT:     Head: Atraumatic.     Right Ear: External ear normal.     Left Ear: External ear normal.     Ears:     Comments: Bilateral middle ear effusions    Nose: Rhinorrhea present.     Mouth/Throat:     Mouth: Mucous membranes are moist.     Pharynx: Posterior oropharyngeal erythema present.  Eyes:     Extraocular Movements: Extraocular movements intact.     Conjunctiva/sclera: Conjunctivae normal.  Cardiovascular:     Rate and Rhythm: Normal rate and regular rhythm.     Heart sounds: Normal heart sounds.  Pulmonary:     Effort: Pulmonary effort is normal.     Breath sounds: Normal breath sounds. No wheezing.  Abdominal:     General: Bowel sounds are normal. There is no distension.     Palpations: Abdomen is soft.     Tenderness: There is no abdominal tenderness. There is no right CVA tenderness, left CVA tenderness or guarding.  Musculoskeletal:        General: Normal range of motion.     Cervical back: Normal range of motion and neck supple.  Skin:    General: Skin is warm and dry.  Neurological:     Mental Status: She is alert and oriented to person, place, and time.  Psychiatric:        Mood and Affect:  Mood normal.        Thought Content: Thought content normal.      UC Treatments / Results  Labs (all labs ordered are listed, but only abnormal results are displayed) Labs Reviewed  POCT URINALYSIS DIP (MANUAL ENTRY) - Abnormal; Notable for the following components:      Result Value   Blood, UA moderate (*)    Leukocytes, UA Trace (*)    All  other components within normal limits  URINE CULTURE    EKG   Radiology No results found.  Procedures Procedures (including critical care time)  Medications Ordered in UC Medications  dexamethasone  (DECADRON ) injection 10 mg (10 mg Intramuscular Given 12/17/23 1145)    Initial Impression / Assessment and Plan / UC Course  I have reviewed the triage vital signs and the nursing notes.  Pertinent labs & imaging results that were available during my care of the patient were reviewed by me and considered in my medical decision making (see chart for details).     Vital signs reassuring today, suspect viral sinusitis causing upper respiratory symptoms.  Suspect lumbar strain causing the back pain worse with movement and palpation.  For both of these things, treat with IM Decadron  and for upper symptoms Flonase , decongestants, saline sinus rinses and for the back pain Zanaflex , heat, massage, stretches.  Regarding her urinary frequency and dysuria, she has a history of recurrent UTIs but also has multiple antibiotic allergies so we will await urine culture to confirm UTI prior to initiating antibiotic therapy.  Push fluids, return for worsening symptoms.  Final Clinical Impressions(s) / UC Diagnoses   Final diagnoses:  Recurrent UTI  Viral sinusitis  Strain of lumbar region, initial encounter     Discharge Instructions      We will let you know when your urine culture is back if we need to treat with antibiotics for this.  Given you a steroid shot both to help with your sinus inflammation as well as your back pain.  Take the Flonase  twice daily, use over-the-counter cold and congestion medications and saline sinus rinses, humidifiers.  Muscle relaxer, heat, stretches for the back pain.    ED Prescriptions     Medication Sig Dispense Auth. Provider   tiZANidine  (ZANAFLEX ) 4 MG capsule Take 1 capsule (4 mg total) by mouth 3 (three) times daily as needed for muscle spasms. Do not  drink alcohol or drive while taking this medication.  May cause drowsiness. 15 capsule Corbin Dess, PA-C   fluticasone  (FLONASE ) 50 MCG/ACT nasal spray Place 1 spray into both nostrils 2 (two) times daily. 16 g Corbin Dess, [place]      PDMP not reviewed this encounter.   Corbin Dess, [place] 12/17/23 1158

## 2023-12-17 NOTE — Discharge Instructions (Signed)
 We will let you know when your urine culture is back if we need to treat with antibiotics for this.  Given you a steroid shot both to help with your sinus inflammation as well as your back pain.  Take the Flonase  twice daily, use over-the-counter cold and congestion medications and saline sinus rinses, humidifiers.  Muscle relaxer, heat, stretches for the back pain.

## 2023-12-18 LAB — URINE CULTURE: Culture: <10000 — AB

## 2024-01-13 ENCOUNTER — Ambulatory Visit: Payer: Managed Care, Other (non HMO) | Admitting: Physician Assistant

## 2024-01-13 ENCOUNTER — Encounter: Payer: Self-pay | Admitting: Physician Assistant

## 2024-01-13 VITALS — BP 125/84 | HR 85 | Temp 97.5°F | Ht 60.0 in | Wt 141.0 lb

## 2024-01-13 DIAGNOSIS — J069 Acute upper respiratory infection, unspecified: Secondary | ICD-10-CM

## 2024-01-13 MED ORDER — PROMETHAZINE-DM 6.25-15 MG/5ML PO SYRP: 5.0000 mL | ORAL_SOLUTION | Freq: Four times a day (QID) | ORAL | 0 refills | Status: DC | PRN

## 2024-01-13 NOTE — Progress Notes (Signed)
Acute Office Visit  Subjective:     Patient ID: Rebecca [name], female    DOB: 12/16/1985, 38 y.o.   MRN: [id number]   HPI Patient is in today for Cough:  Symptoms began 4 days ago.  The cough is productive of clear sputum, with wheezing and is aggravated by reclining position Associated symptoms include: congestion, ear pressure, and sinus pain . Patient does not have a history of asthma. Patient does not have a history of environmental allergens. Patient does not have recent travel. Patient does have a history of smoking. Patient  has not previous Chest X-ray.   Review of Systems  Constitutional:  Negative for fever and malaise/fatigue.  HENT:  Positive for congestion and ear pain (pressure). Negative for sore throat.   Respiratory:  Positive for cough, sputum production and wheezing. Negative for shortness of breath.   Cardiovascular:  Negative for chest pain and palpitations.  Musculoskeletal:  Negative for myalgias.  Neurological:  Negative for headaches.        Objective:     BP 125/84   Pulse 85   Temp (!) 97.5 F (36.4 C)   Ht 5' (1.524 m)   Wt 141 lb (64 kg)   LMP 12/14/2023 (Exact Date)   SpO2 100%   BMI 27.54 kg/m   Physical Exam Vitals reviewed.  Constitutional:      General: She is not in acute distress.    Appearance: Normal appearance.  HENT:     Right Ear: Tympanic membrane normal.     Left Ear: Tympanic membrane normal.     Nose:     Right Sinus: Maxillary sinus tenderness present.     Left Sinus: Maxillary sinus tenderness present.     Comments: Scab noted in right superior/lateral nare    Mouth/Throat:     Mouth: Mucous membranes are moist.     Pharynx: Oropharynx is clear.  Eyes:     Extraocular Movements: Extraocular movements intact.     Conjunctiva/sclera: Conjunctivae normal.  Cardiovascular:     Rate and Rhythm: Normal rate and regular rhythm.     Heart sounds: No murmur heard.    No friction rub. No gallop.  Pulmonary:      Effort: Pulmonary effort is normal.     Breath sounds: No stridor. Wheezing present. No rhonchi or rales.  Musculoskeletal:        General: Normal range of motion.  Lymphadenopathy:     Cervical: No cervical adenopathy.  Skin:    General: Skin is warm and dry.     Capillary Refill: Capillary refill takes less than 2 seconds.  Neurological:     General: No focal deficit present.     Mental Status: She is alert and oriented to person, place, and time.  Psychiatric:        Mood and Affect: Mood normal.        Behavior: Behavior normal.     No results found for any visits on 01/13/24.      Assessment & Plan:  Viral URI with cough -     Promethazine-DM; Take 5 mLs by mouth 4 (four) times daily as needed.  Dispense: 118 mL; Refill: 0   Patient appears stable today. Benign exam, lungs clear to auscultation bilaterally, no indication for chest XR at this time. Likely self-resolving viral infection. Supportive care reviewed with patient. Discussed with patient that there are no indications for antibiotics at this time, and viral respiratory illness can be persistent in  duration. Promethazine DM for cough. Tylenol or ibuprofen for pain as needed. May continue with OTC cold medications, Mucinex samples given today. I advised Flonsae for congestion and ear pressure. I advised Vaseline for nasal sore/scab, no signs of infection. Patient instructed to return to clinic if worsening shortness of breath, chest pain, hypoxia, or other concerns. Patient agreeable to plan.    Return if symptoms worsen or fail to improve.  Toni Amend Marlen Koman, PA-C

## 2024-01-14 ENCOUNTER — Ambulatory Visit: Payer: Managed Care, Other (non HMO) | Admitting: Adult Health

## 2024-01-14 ENCOUNTER — Encounter: Payer: Self-pay | Admitting: Adult Health

## 2024-01-14 VITALS — BP 131/82 | HR 69 | Ht 60.0 in | Wt 141.5 lb

## 2024-01-14 DIAGNOSIS — Z01419 Encounter for gynecological examination (general) (routine) without abnormal findings: Secondary | ICD-10-CM | POA: Insufficient documentation

## 2024-01-14 DIAGNOSIS — Z1331 Encounter for screening for depression: Secondary | ICD-10-CM

## 2024-01-14 NOTE — Progress Notes (Signed)
Patient ID: Rebecca [name], female   DOB: [birth year], 38 y.o.   MRN: [id number] History of Present Illness:  Rebecca [name] is a 38 year old white female,married, G3P2103 in for a well woman gyn exam. She saw PCP for cough and head congestion yesterday and has cough syrup and taking mucinex D.     Component Value Date/Time   DIAGPAP  01/10/2023 1139    - Negative for Intraepithelial Lesions or Malignancy (NILM)   DIAGPAP - Benign reactive/reparative changes 01/10/2023 1139   DIAGPAP  06/06/2020 1051    - Negative for intraepithelial lesion or malignancy (NILM)   HPVHIGH Negative 01/10/2023 1139   HPVHIGH Negative 06/06/2020 1051   ADEQPAP  01/10/2023 1139    Satisfactory for evaluation; transformation zone component PRESENT.   ADEQPAP  06/06/2020 1051    Satisfactory for evaluation; transformation zone component PRESENT.   ADEQPAP  07/07/2017 0000    Satisfactory for evaluation  endocervical/transformation zone component PRESENT.    PCP is Dr Adriana Simas.  Current Medications, Allergies, Past Medical History, Past Surgical History, Family History and Social History were reviewed in [name].     Review of Systems: Patient denies any headaches, hearing loss, fatigue, blurred vision, shortness of breath, chest pain, abdominal pain, problems with bowel movements, urination, or intercourse. No joint pain or mood swings.     Physical Exam:BP 131/82 (BP Location: Left Arm, Patient Position: Sitting, Cuff Size: Normal)   Pulse 69   Ht 5' (1.524 m)   Wt 141 lb 8 oz (64.2 kg)   LMP 01/06/2024 (Exact Date)   BMI 27.63 kg/m   General:  Well developed, well nourished, no acute distress Skin:  Warm and dry Neck:  Midline trachea, normal thyroid, good ROM, no lymphadenopathy Lungs; Clear to auscultation bilaterally Breast:  No dominant palpable mass, retraction, or nipple discharge Cardiovascular: Regular rate and rhythm Abdomen:  Soft, non tender, no  hepatosplenomegaly Pelvic:  External genitalia is normal in appearance, no lesions.  The vagina is normal in appearance. Urethra has no lesions or masses. The cervix is bulbous.  Uterus is felt to be normal size, shape, and contour.  No adnexal masses or tenderness noted.Bladder is non tender, no masses felt. Extremities/musculoskeletal:  No swelling or varicosities noted, no clubbing or cyanosis Psych:  No mood changes, alert and cooperative,seems happy AA is 0 Fall risk is low    01/14/2024   11:09 AM 01/13/2024    9:23 AM 08/14/2023    4:34 PM  Depression screen PHQ 2/9  Decreased Interest 0 0 0  Down, Depressed, Hopeless 0 0 0  PHQ - 2 Score 0 0 0  Altered sleeping 0 0 0  Tired, decreased energy 0 0 1  Change in appetite 0 0 0  Feeling bad or failure about yourself  0 0 0  Trouble concentrating 0 0 0  Moving slowly or fidgety/restless 0 0 0  Suicidal thoughts 0 0 0  PHQ-9 Score 0 0 1  Difficult doing work/chores  Not difficult at all Not difficult at all       01/14/2024   11:10 AM 01/13/2024    9:23 AM 08/14/2023    4:34 PM 08/06/2023   11:28 AM  GAD 7 : Generalized Anxiety Score  Nervous, Anxious, on Edge 0 0 1 2  Control/stop worrying 0 0 0 1  Worry too much - different things 0 0 0 1  Trouble relaxing 0 0 0 0  Restless 0 0 0  0  Easily annoyed or irritable 0 0 0 0  Afraid - awful might happen 0 0 0 0  Total GAD 7 Score 0 0 1 4  Anxiety Difficulty  Not difficult at all Not difficult at all Not difficult at all      Upstream - 01/14/24 1113       Pregnancy Intention Screening   Does the patient want to become pregnant in the next year? No    Does the patient's partner want to become pregnant in the next year? No    Would the patient like to discuss contraceptive options today? No      Contraception Wrap Up   Current Method Vasectomy    End Method Vasectomy    Contraception Counseling Provided No            Examination chaperoned by Malachy Mood  LPN   Impression and plan: 1. Encounter for well woman exam with routine gynecological exam (Primary) Pap in 2027 Physical in 1 year Push fluids and try pineapple juice

## 2024-01-15 ENCOUNTER — Ambulatory Visit: Payer: Managed Care, Other (non HMO) | Admitting: Nurse Practitioner

## 2024-01-29 ENCOUNTER — Other Ambulatory Visit: Payer: Self-pay | Admitting: Nurse Practitioner

## 2024-01-29 ENCOUNTER — Ambulatory Visit
Admission: RE | Admit: 2024-01-29 | Discharge: 2024-01-29 | Disposition: A | Discharge status: Home / Self Care | Payer: Self-pay | Source: Ambulatory Visit | Attending: Nurse Practitioner | Admitting: Nurse Practitioner

## 2024-01-29 VITALS — BP 128/83 | HR 75 | Temp 98.3°F | Resp 16

## 2024-01-29 DIAGNOSIS — J069 Acute upper respiratory infection, unspecified: Secondary | ICD-10-CM | POA: Diagnosis not present

## 2024-01-29 LAB — POC COVID19/FLU A&B COMBO
Covid Antigen, POC: NEGATIVE
Influenza A Antigen, POC: NEGATIVE
Influenza B Antigen, POC: NEGATIVE

## 2024-01-29 MED ORDER — BENZONATATE 100 MG PO CAPS
100.0000 mg | ORAL_CAPSULE | Freq: Three times a day (TID) | ORAL | 0 refills | Status: DC | PRN
Start: 2024-01-29 — End: 2024-04-21

## 2024-01-29 NOTE — ED Provider Notes (Signed)
 [facility]    CSN: [id number] Arrival date & time: 01/29/24  1229      History   Chief Complaint Chief Complaint  Patient presents with   Nasal Congestion    Head feels like a ballon congested .ears hurt - Entered by patient    HPI Rebecca [name] is a 38 y.o. female.   Patient presents today with 3-day history of congested cough with light yellow mucus, runny nose, postnasal drainage, headache, left ear soreness going down her left neck, decreased appetite, and fatigue.  She denies fever, body aches or chills, shortness of breath or chest pain, stuffy nose, sore throat, abdominal pain, nausea/vomiting, and diarrhea.  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  Has used a humidifier, DayQuil, Alka-Seltzer cold and flu, and saline nasal spray for symptoms with minimal temporary relief.    Past Medical History:  Diagnosis Date   Anxiety    Back pain    GERD (gastroesophageal reflux disease)    Hyperlipidemia    Smoker 05/14/2016    Patient Active Problem List   Diagnosis Date Noted   Encounter for well woman exam with routine gynecological exam 01/14/2024   Musculoskeletal pain 11/04/2023   Middle ear effusion 11/04/2023   Rectocele 08/19/2023   Recurrent UTI 08/06/2023   Hidradenitis suppurativa 05/23/2023   Encounter for removal and reinsertion of Nexplanon 01/14/2023   Irritable bowel syndrome with both constipation and diarrhea 08/20/2021   OAB (overactive bladder) 04/25/2020   Dyslipidemia (high LDL; low HDL) 09/17/2019   Smoker 05/14/2016   Depression with anxiety 04/05/2013    Past Surgical History:  Procedure Laterality Date   NO PAST SURGERIES      OB History     Gravida  3   Para  3   Term  2   Preterm  1   AB      Living  3      SAB      IAB      Ectopic      Multiple      Live Births  3            Home Medications    Prior to Admission medications    Medication Sig Start Date End Date Taking? Authorizing Provider  benzonatate (TESSALON) 100 MG capsule Take 1 capsule (100 mg total) by mouth 3 (three) times daily as needed for cough. Do not take with alcohol or while operating or driving heavy machinery 9/56/38  Yes Valentino Nose, NP  ALPRAZolam (XANAX) 0.5 MG tablet TAKE 1 TABLET(0.5 MG) BY MOUTH TWICE DAILY AS NEEDED FOR ANXIETY 11/07/23   Cook, Jayce G, DO  fluticasone (FLONASE) 50 MCG/ACT nasal spray Place 1 spray into both nostrils 2 (two) times daily. 12/17/23   Particia Nearing, PA-C  Multiple Vitamin (MULTIVITAMIN) tablet Take 1 tablet by mouth daily.    [provider]  omeprazole (PRILOSEC) 20 MG capsule TAKE 1 CAPSULE BY MOUTH DAILY 04/07/23   Cyril Mourning A, NP  simvastatin (ZOCOR) 20 MG tablet Take 1 tablet by mouth daily. 08/13/23   Campbell Riches, NP    Family History Family History  Problem Relation Age of Onset   Cancer Maternal Grandmother        lung   Cancer Maternal Grandfather        unsure    CAD Father    Stroke Father    Liver  disease Father    Alcohol abuse Father    Other Mother        brain tumor   Dementia Mother    Anxiety disorder Sister    Other Daughter        acid reflux   Asthma Son    Bronchitis Son    ADD / ADHD Son     Social History Social History   Tobacco Use   Smoking status: Every Day    Current packs/day: 0.50    Average packs/day: 0.5 packs/day for 15.0 years (7.5 ttl pk-yrs)    Types: Cigarettes    Passive exposure: Current   Smokeless tobacco: Never  Vaping Use   Vaping status: Never Used  Substance Use Topics   Alcohol use: No   Drug use: Never    Types: Hydrocodone     Allergies   Amoxicillin, Cephalosporins, Septra [bactrim], Ciprocinonide [fluocinolone], Macrobid [nitrofurantoin], and Penicillins   Review of Systems Review of Systems Per HPI  Physical Exam Triage Vital Signs ED Triage Vitals  Encounter Vitals Group     BP  01/29/24 1326 128/83     Systolic BP Percentile --      Diastolic BP Percentile --      Pulse Rate 01/29/24 1326 75     Resp 01/29/24 1326 16     Temp 01/29/24 1326 98.3 F (36.8 C)     Temp Source 01/29/24 1326 Oral     SpO2 01/29/24 1326 99 %     Weight --      Height --      Head Circumference --      Peak Flow --      Pain Score 01/29/24 1329 0     Pain Loc --      Pain Education --      Exclude from Growth Chart --    No data found.  Updated Vital Signs BP 128/83 (BP Location: Right Arm)   Pulse 75   Temp 98.3 F (36.8 C) (Oral)   Resp 16   LMP 01/29/2024 (Exact Date)   SpO2 99%   Visual Acuity Right Eye Distance:   Left Eye Distance:   Bilateral Distance:    Right Eye Near:   Left Eye Near:    Bilateral Near:     Physical Exam Vitals and nursing note reviewed.  Constitutional:      General: She is not in acute distress.    Appearance: Normal appearance. She is not ill-appearing or toxic-appearing.  HENT:     Head: Normocephalic and atraumatic.     Right Ear: Tympanic membrane, ear canal and external ear normal.     Left Ear: Tympanic membrane, ear canal and external ear normal.     Nose: Congestion present. No rhinorrhea.     Mouth/Throat:     Mouth: Mucous membranes are moist.     Pharynx: Oropharynx is clear. No oropharyngeal exudate or posterior oropharyngeal erythema.  Eyes:     General: No scleral icterus.    Extraocular Movements: Extraocular movements intact.  Cardiovascular:     Rate and Rhythm: Normal rate and regular rhythm.  Pulmonary:     Effort: Pulmonary effort is normal. No respiratory distress.     Breath sounds: Normal breath sounds. No wheezing, rhonchi or rales.  Musculoskeletal:     Cervical back: Normal range of motion and neck supple.  Lymphadenopathy:     Cervical: No cervical adenopathy.  Skin:    General: Skin is  warm and dry.     Coloration: Skin is not jaundiced or pale.     Findings: No erythema or rash.   Neurological:     Mental Status: She is alert and oriented to person, place, and time.  Psychiatric:        Behavior: Behavior is cooperative.      UC Treatments / Results  Labs (all labs ordered are listed, but only abnormal results are displayed) Labs Reviewed  POC COVID19/FLU A&B COMBO    EKG   Radiology No results found.  Procedures Procedures (including critical care time)  Medications Ordered in UC Medications - No data to display  Initial Impression / Assessment and Plan / UC Course  I have reviewed the triage vital signs and the nursing notes.  Pertinent labs & imaging results that were available during my care of the patient were reviewed by me and considered in my medical decision making (see chart for details).   Patient is well-appearing, normotensive, afebrile, not tachycardic, not tachypneic, oxygenating well on room air.    1. Viral URI with cough Vitals and exam are reassuring today Suspect viral etiology COVID-19, influenza testing is negative Supportive care discussed with patient Start cough suppressant medication ER and return precautions discussed Work excuse provided  The patient was given the opportunity to ask questions.  All questions answered to their satisfaction.  The patient is in agreement to this plan.    Final Clinical Impressions(s) / UC Diagnoses   Final diagnoses:  Viral URI with cough     Discharge Instructions      You have a viral upper respiratory infection.  Symptoms should improve over the next week to 10 days.  If you develop chest pain or shortness of breath, go to the emergency room.  COVID-19 and influenza test are negative today.  Some things that can make you feel better are: - Increased rest - Increasing fluid with water/sugar free electrolytes - Acetaminophen and ibuprofen as needed for fever/pain - Salt water gargling, chloraseptic spray and throat lozenges - OTC guaifenesin (Mucinex) 600 mg twice  daily - Saline sinus flushes or a neti pot - Humidifying the air -Tessalon Perles every 8 hours as needed for dry cough  - Flonase nasal spray for congestion     ED Prescriptions     Medication Sig Dispense Auth. Provider   benzonatate (TESSALON) 100 MG capsule Take 1 capsule (100 mg total) by mouth 3 (three) times daily as needed for cough. Do not take with alcohol or while operating or driving heavy machinery 21 capsule Valentino Nose, NP      PDMP not reviewed this encounter.   Valentino Nose, NP 01/29/24 1556

## 2024-01-29 NOTE — ED Triage Notes (Signed)
 Pt reports cough congestion,headache ear pain neck pain sore throat x 3 days

## 2024-01-29 NOTE — Discharge Instructions (Signed)
 You have a viral upper respiratory infection.  Symptoms should improve over the next week to 10 days.  If you develop chest pain or shortness of breath, go to the emergency room.  COVID-19 and influenza test are negative today.  Some things that can make you feel better are: - Increased rest - Increasing fluid with water/sugar free electrolytes - Acetaminophen and ibuprofen as needed for fever/pain - Salt water gargling, chloraseptic spray and throat lozenges - OTC guaifenesin (Mucinex) 600 mg twice daily - Saline sinus flushes or a neti pot - Humidifying the air -Tessalon Perles every 8 hours as needed for dry cough  - Flonase nasal spray for congestion

## 2024-02-02 ENCOUNTER — Encounter: Payer: Self-pay | Admitting: Nurse Practitioner

## 2024-02-03 ENCOUNTER — Other Ambulatory Visit: Payer: Self-pay | Admitting: Nurse Practitioner

## 2024-02-03 MED ORDER — AZITHROMYCIN 250 MG PO TABS: ORAL_TABLET | ORAL | 0 refills | Status: DC

## 2024-03-01 ENCOUNTER — Ambulatory Visit: Admitting: Nurse Practitioner

## 2024-03-01 ENCOUNTER — Encounter: Payer: Self-pay | Admitting: Nurse Practitioner

## 2024-03-01 VITALS — BP 117/81 | HR 78 | Temp 98.6°F | Resp 100 | Ht 60.0 in | Wt 137.0 lb

## 2024-03-01 DIAGNOSIS — R350 Frequency of micturition: Secondary | ICD-10-CM

## 2024-03-01 DIAGNOSIS — N3281 Overactive bladder: Secondary | ICD-10-CM | POA: Diagnosis not present

## 2024-03-01 DIAGNOSIS — M778 Other enthesopathies, not elsewhere classified: Secondary | ICD-10-CM | POA: Diagnosis not present

## 2024-03-01 DIAGNOSIS — K219 Gastro-esophageal reflux disease without esophagitis: Secondary | ICD-10-CM | POA: Insufficient documentation

## 2024-03-01 DIAGNOSIS — X503XXA Overexertion from repetitive movements, initial encounter: Secondary | ICD-10-CM | POA: Insufficient documentation

## 2024-03-01 LAB — POCT URINALYSIS DIP (CLINITEK)
Bilirubin, UA: NEGATIVE
Blood, UA: NEGATIVE
Glucose, UA: NEGATIVE mg/dL
Ketones, POC UA: NEGATIVE mg/dL
Leukocytes, UA: NEGATIVE
Nitrite, UA: NEGATIVE
POC PROTEIN,UA: NEGATIVE
Spec Grav, UA: <=1.005 — AB (ref 1.010–1.025)
Urobilinogen, UA: 0.2 U/dL
pH, UA: 6 (ref 5.0–8.0)

## 2024-03-01 MED ORDER — OMEPRAZOLE 20 MG PO CPDR
DELAYED_RELEASE_CAPSULE | ORAL | 2 refills | Status: DC
Start: 2024-03-01 — End: 2024-07-19

## 2024-03-01 NOTE — Progress Notes (Signed)
 Subjective:    Patient ID: Rebecca [name], female    DOB: 25-Nov-1986, 38 y.o.   MRN: [id number]  HPI Presents for complaints of pain in the right wrist area for the past 2 weeks.  No specific history of injury.  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  Working 5 days a week at this time.  She is right-handed.  Can barely lift plates because of the pain and mild weakness.  Pain will radiate up her arm at times across and down to the left side of her back.  With the majority the pain is in the wrist area.  Slight improvement with ibuprofen Vicks vapor rub and a heating pad.  Has seen a major flareup of her GERD, smokes tobacco, drinks a large amount of caffeine, taking frequent ibuprofen.  States she had a couple of tacos last night which caused severe reflux.  Currently on omeprazole 20 mg daily which was working well. Also of note patient has frequent urination and OAB.  Is ready for a referral for evaluation.  Review of Systems  Constitutional:  Positive for fatigue. Negative for fever.  Respiratory:  Negative for cough, chest tightness, shortness of breath and wheezing.   Genitourinary:  Positive for decreased urine volume, enuresis and frequency. Negative for dysuria.      01/14/2024   11:09 AM  Depression screen PHQ 2/9  Decreased Interest 0  Down, Depressed, Hopeless 0  PHQ - 2 Score 0  Altered sleeping 0  Tired, decreased energy 0  Change in appetite 0  Feeling bad or failure about yourself  0  Trouble concentrating 0  Moving slowly or fidgety/restless 0  Suicidal thoughts 0  PHQ-9 Score 0      01/14/2024   11:10 AM 01/13/2024    9:23 AM 08/14/2023    4:34 PM 08/06/2023   11:28 AM  GAD 7 : Generalized Anxiety Score  Nervous, Anxious, on Edge 0 0 1 2  Control/stop worrying 0 0 0 1  Worry too much - different things 0 0 0 1  Trouble relaxing 0 0 0 0  Restless 0 0 0 0  Easily annoyed or irritable 0 0 0 0   Afraid - awful might happen 0 0 0 0  Total GAD 7 Score 0 0 1 4  Anxiety Difficulty  Not difficult at all Not difficult at all Not difficult at all    Social History   Tobacco Use   Smoking status: Every Day    Current packs/day: 0.50    Average packs/day: 0.5 packs/day for 15.0 years (7.5 ttl pk-yrs)    Types: Cigarettes    Passive exposure: Current   Smokeless tobacco: Never  Vaping Use   Vaping status: Never Used  Substance Use Topics   Alcohol use: No   Drug use: Never    Types: Hydrocodone        Objective:   Physical Exam NAD.  Alert, oriented.  Calm cheerful affect.  Lungs clear.  Heart regular rate rhythm.  Abdomen distinct significant epigastric area tenderness noted to deep palpation on exam.  Otherwise benign.  Normal ROM of the neck without limitations or tenderness.  Slightly tight muscles noted along the right trapezius.  No shoulder joint line tenderness on the right side.  Normal ROM of the elbow and shoulder on the right side without tenderness.  Normal ROM of the wrist with  minimal tenderness distinct tenderness noted with palpation of the wrist plantar and dorsal aspects.  No erythema warmth or edema.  Strong radial pulse.  Arm strength 5+ bilaterally.  Hand strength 5+ left 4+ right. Results for orders placed or performed in visit on 03/01/24  POCT URINALYSIS DIP (CLINITEK)   Collection Time: 03/01/24 10:28 AM  Result Value Ref Range   Color, UA yellow yellow   Clarity, UA clear clear   Glucose, UA negative negative mg/dL   Bilirubin, UA negative negative   Ketones, POC UA negative negative mg/dL   Spec Grav, UA <=1.610 (A) 1.010 - 1.025   Blood, UA negative negative   pH, UA 6.0 5.0 - 8.0   POC PROTEIN,UA negative negative, trace   Urobilinogen, UA 0.2 0.2 or 1.0 E.U./dL   Nitrite, UA Negative Negative   Leukocytes, UA Negative Negative          Assessment & Plan:   Problem List Items Addressed This Visit       Digestive   Gastroesophageal  reflux disease without esophagitis   Relevant Medications   omeprazole (PRILOSEC) 20 MG capsule     Musculoskeletal and Integument   Right wrist tendonitis - Primary     Genitourinary   OAB (overactive bladder)   Relevant Orders   Ambulatory referral to Urogynecology     Other   Overuse injury   Other Visit Diagnoses       Frequent urination       Relevant Orders   POCT URINALYSIS DIP (CLINITEK) (Completed)   Ambulatory referral to Urogynecology      Meds ordered this encounter  Medications   omeprazole (PRILOSEC) 20 MG capsule    Sig: TAKE 1 CAPSULE BY MOUTH TWICE A DAY FOR PRN GERD    Dispense:  60 capsule    Refill:  2    Supervising Provider:   Lilyan Punt A [[id number]]   Increase omeprazole to twice daily dosing.  Discussed lifestyle factors including decreasing caffeine intake, cutting back on tobacco use and avoiding spicy or citrusy foods.  Given written and verbal information on dietary measures.  Call back if no improvement on new dosing.  Referral to urogynecology for evaluation of OAB and urinary incontinence. Given prescription for a new wrist brace.  Will avoid NSAIDs due to significant reflux.  Patient states she cannot take prednisone due to tachycardia.  Consider a topical agent such as: Biofreeze [place] gel Tiger balm Volatren gel Lidocaine gel Wrist pain is most likely due to overuse injury due to repetitive motions that she does as part of her job.  Will be cutting back on those hours soon to start a new position.  Contact the office if problem persist. Return if symptoms worsen or fail to improve.

## 2024-03-01 NOTE — Patient Instructions (Signed)
 Biofreeze [place] gel Tiger balm Volatren gel Lidocaine gel  GERD in Adults: Diet Changes When you have gastroesophageal reflux disease (GERD), you may need to make changes to your diet. Choosing the right foods can help with your symptoms. Think about working with an expert in healthy eating called a dietitian. They can help you make healthy food choices. What are tips for following this plan? Reading food labels Look for foods that are low in saturated fat. Foods that may help with your symptoms include: Foods with less than 5% of daily value (DV) of fat. Foods with 0 grams of trans fat. Cooking [employer] in ways that don't use a lot of fat. These ways include: Baking. Steaming. Grilling. Broiling. To add flavor, try to use herbs that are low in spice and acidity. Avoid frying your food. Meal planning  Eat small meals often rather than eating 3 large meals each day. Eat your meals slowly in a place where you feel relaxed. If told by your health care provider, avoid: Foods that cause symptoms. Keep a food diary to keep track of foods that cause symptoms. Alcohol. Drinking a lot of liquid with meals. General instructions For 2-3 hours after you eat, avoid: Bending over. Exercise. Lying down. Chew sugar-free gum after meals. What foods should I eat? Eat a healthy diet. Try to include: Foods with high amounts of fiber. These include: Fruits and vegetables. Whole grains and beans. Low-fat dairy products. Lean meats, fish, and poultry. Egg whites. Foods that cause symptoms in someone else may not cause symptoms for you. Work with your provider to find foods that are safe for you. The items listed above may not be all the foods and drinks you can have. Talk with a dietitian to learn more. The items listed above may not be a complete list of foods and beverages you can eat and drink. Contact a dietitian for more information. What foods should I avoid? Limiting some of  these foods may help with your symptoms. Each person is different. Talk with a dietitian or your provider to help you find the exact foods to avoid. Some of the foods to avoid may include: Fruits Fruits with a lot of acid in them. These may include citrus fruits, such as oranges, grapefruit, pineapple, and lemons. Vegetables Deep-fried vegetables, such as [place] fries. Vegetables, sauces, or toppings made with added fat and vegetables with acid in them. These may include tomatoes and tomato products, chili peppers, onions, garlic, and horseradish. Grains Pastries or quick breads with added fat. Meats and other proteins High-fat meats, such as fatty beef or pork, hot dogs, ribs, ham, sausage, salami, and bacon. Fried meat or protein, such as fried fish and fried chicken. Egg yolks. Fats and oils Butter. Margarine. Shortening. Ghee. Drinks Coffee and other drinks with caffeine in them. Fizzy and sugary drinks, such as soda and energy drinks. Fruit juice made with acidic fruits, such as orange or grapefruit. Tomato juice. Sweets and desserts Chocolate and cocoa. Donuts. Seasonings and condiments Mint, such as peppermint and spearmint. Condiments, herbs, or seasonings that cause symptoms. These may include curry, hot sauce, or vinegar-based salad dressings. The items listed above may not be all the foods and drinks you should avoid. Talk with a dietitian to learn more. Questions to ask your health care provider Changes to your diet and everyday life are often the first steps taken to manage symptoms of GERD. If these changes don't help, talk with your provider about taking medicines.  Where to find more information [employer]: aboutgerd.org This information is not intended to replace advice given to you by your health care provider. Make sure you discuss any questions you have with your health care provider. Document Revised: 09/30/2023 Document  Reviewed: 04/16/2023 Elsevier Patient Education  2024 [employer].

## 2024-03-31 ENCOUNTER — Other Ambulatory Visit: Payer: Self-pay

## 2024-03-31 ENCOUNTER — Encounter: Payer: Self-pay | Admitting: Emergency Medicine

## 2024-03-31 ENCOUNTER — Ambulatory Visit
Admission: EM | Admit: 2024-03-31 | Discharge: 2024-03-31 | Disposition: A | Discharge status: Home / Self Care | Attending: Nurse Practitioner | Admitting: Nurse Practitioner

## 2024-03-31 ENCOUNTER — Encounter: Payer: Self-pay | Admitting: Nurse Practitioner

## 2024-03-31 DIAGNOSIS — M5442 Lumbago with sciatica, left side: Secondary | ICD-10-CM | POA: Diagnosis present

## 2024-03-31 DIAGNOSIS — N898 Other specified noninflammatory disorders of vagina: Secondary | ICD-10-CM | POA: Diagnosis not present

## 2024-03-31 DIAGNOSIS — R350 Frequency of micturition: Secondary | ICD-10-CM | POA: Insufficient documentation

## 2024-03-31 LAB — POCT URINALYSIS DIP (MANUAL ENTRY)
Bilirubin, UA: NEGATIVE
Blood, UA: NEGATIVE
Glucose, UA: NEGATIVE mg/dL
Ketones, POC UA: NEGATIVE mg/dL
Leukocytes, UA: NEGATIVE
Nitrite, UA: NEGATIVE
Protein Ur, POC: NEGATIVE mg/dL
Spec Grav, UA: 1.015 (ref 1.010–1.025)
Urobilinogen, UA: 0.2 U/dL
pH, UA: 6 (ref 5.0–8.0)

## 2024-03-31 MED ORDER — TIZANIDINE HCL 4 MG PO TABS: 4.0000 mg | ORAL_TABLET | Freq: Three times a day (TID) | ORAL | 0 refills | Status: DC | PRN

## 2024-03-31 MED ORDER — KETOROLAC TROMETHAMINE 30 MG/ML IJ SOLN
30.0000 mg | Freq: Once | INTRAMUSCULAR | Status: AC
  Administered 2024-03-31: 30 mg via INTRAMUSCULAR

## 2024-03-31 MED ORDER — DICLOFENAC SODIUM 50 MG PO TBEC: 50.0000 mg | DELAYED_RELEASE_TABLET | Freq: Two times a day (BID) | ORAL | 0 refills | Status: DC

## 2024-03-31 NOTE — Discharge Instructions (Addendum)
 You were given an injection of Toradol  30 mg.  Do not take any additional NSAIDs today to include ibuprofen , Advil , Aleve , or naproxen .  May take over-the-counter Tylenol  for breakthrough pain or discomfort. Cytology swab is pending.  You will be contacted if the pending test results are abnormal.  You will also access to your results via MyChart. Take medication as prescribed. Increase fluids and allow for plenty of rest.  Make sure you drink at least 8-10 8 ounce glasses of water daily. Develop a toileting schedule that will allow you to urinate at least every 2 hours. Gentle stretching exercises are recommended.  I provided exercises for you to perform at least 2-3 times daily while symptoms persist. Continue the use of ice or heat as needed.  Apply ice for pain or swelling, heat for spasm or stiffness.  Apply for 20 minutes, remove for 1 hour, repeat as needed. Follow-up with your primary care physician for worsening or continued urinary symptoms or if back pain does not improve. Go to the emergency department immediately if you experience loss of your bowel or bladder function, weakness in your legs or feet, or if you become unable to walk. Follow-up as needed.

## 2024-03-31 NOTE — ED Triage Notes (Addendum)
 Pt reports lower back pain with radiation of pain/numbness to LLE, urinary frequency, "white" vaginal discharge, pelvic pain x3 days. Pt reports symptoms started at same time. Denies injury.

## 2024-03-31 NOTE — ED Provider Notes (Signed)
 [facility]    CSN: [id number] Arrival date & time: 03/31/24  1858      History   Chief Complaint Chief Complaint  Patient presents with   Back Pain    HPI Rebecca [name] is a 38 y.o. female.   The history is provided by the patient.   Patient presents for a 3-day history of urinary frequency, low back pain, and vaginal discharge.  Patient states that her symptoms started at the same time.  She denies fever, chills, chest pain, abdominal pain, nausea, vomiting, dysuria, hematuria, decreased urine stream, flank pain, or abdominal pain.  She states that the back pain goes across both sides of her back and radiates down the left leg causing her left foot to be numb.  She states that the vaginal discharge is "thin and stringy."  She denies concern for STI/STD.  Patient reports prior history of recurrent UTIs.  Patient's chart also indicates a prior history of back pain.  Patient states that she has not had any injury or trauma, or lower extremity weakness.  Past Medical History:  Diagnosis Date   Anxiety    Back pain    GERD (gastroesophageal reflux disease)    Hyperlipidemia    Smoker 05/14/2016    Patient Active Problem List   Diagnosis Date Noted   Overuse injury 03/01/2024   Right wrist tendonitis 03/01/2024   Gastroesophageal reflux disease without esophagitis 03/01/2024   Encounter for well woman exam with routine gynecological exam 01/14/2024   Musculoskeletal pain 11/04/2023   Middle ear effusion 11/04/2023   Rectocele 08/19/2023   Recurrent UTI 08/06/2023   Hidradenitis suppurativa 05/23/2023   Encounter for removal and reinsertion of Nexplanon  01/14/2023   Irritable bowel syndrome with both constipation and diarrhea 08/20/2021   OAB (overactive bladder) 04/25/2020   Dyslipidemia (high LDL; low HDL) 09/17/2019   Smoker 05/14/2016   Depression with anxiety 04/05/2013    Past Surgical History:  Procedure Laterality Date   NO PAST SURGERIES       OB History     Gravida  3   Para  3   Term  2   Preterm  1   AB      Living  3      SAB      IAB      Ectopic      Multiple      Live Births  3            Home Medications    Prior to Admission medications   Medication Sig Start Date End Date Taking? Authorizing Provider  diclofenac (VOLTAREN) 50 MG EC tablet Take 1 tablet (50 mg total) by mouth 2 (two) times daily. 03/31/24  Yes Leath-Warren, Belen Bowers, NP  tiZANidine  (ZANAFLEX ) 4 MG tablet Take 1 tablet (4 mg total) by mouth every 8 (eight) hours as needed for muscle spasms. 03/31/24  Yes Leath-Warren, Belen Bowers, NP  ALPRAZolam  (XANAX ) 0.5 MG tablet TAKE 1 TABLET(0.5 MG) BY MOUTH TWICE DAILY AS NEEDED FOR ANXIETY 11/07/23   Cook, Jayce G, DO  azithromycin  (ZITHROMAX  Z-PAK) 250 MG tablet Take 2 tablets (500 mg) on  Day 1,  followed by 1 tablet (250 mg) once daily on Days 2 through 5. 02/03/24   Hoskins, Carolyn C, NP  benzonatate  (TESSALON ) 100 MG capsule Take 1 capsule (100 mg total) by mouth 3 (three) times daily as needed for cough. Do not take with alcohol or while operating or driving heavy machinery  01/29/24   Wilhemena Harbour, NP  fluticasone  (FLONASE ) 50 MCG/ACT nasal spray Place 1 spray into both nostrils 2 (two) times daily. 12/17/23   Corbin Dess, PA-C  Multiple Vitamin (MULTIVITAMIN) tablet Take 1 tablet by mouth daily.    [provider]  omeprazole  (PRILOSEC) 20 MG capsule TAKE 1 CAPSULE BY MOUTH TWICE A DAY FOR PRN GERD 03/01/24   Hoskins, Carolyn C, NP  simvastatin  (ZOCOR ) 20 MG tablet Take 1 tablet by mouth daily. 08/13/23   Derenda Flax, NP    Family History Family History  Problem Relation Age of Onset   Cancer Maternal Grandmother        lung   Cancer Maternal Grandfather        unsure    CAD Father    Stroke Father    Liver disease Father    Alcohol abuse Father    Other Mother        brain tumor   Dementia Mother    Anxiety disorder Sister    Other  Daughter        acid reflux   Asthma Son    Bronchitis Son    ADD / ADHD Son     Social History Social History   Tobacco Use   Smoking status: Every Day    Current packs/day: 0.50    Average packs/day: 0.5 packs/day for 15.0 years (7.5 ttl pk-yrs)    Types: Cigarettes    Passive exposure: Current   Smokeless tobacco: Never  Vaping Use   Vaping status: Never Used  Substance Use Topics   Alcohol use: No   Drug use: Never    Types: Hydrocodone      Allergies   Amoxicillin, Cephalosporins, Septra [bactrim], Ciprocinonide [fluocinolone], Macrobid  [nitrofurantoin ], and Penicillins   Review of Systems Review of Systems Per HPI  Physical Exam Triage Vital Signs ED Triage Vitals  Encounter Vitals Group     BP 03/31/24 1909 122/83     Systolic BP Percentile --      Diastolic BP Percentile --      Pulse Rate 03/31/24 1909 85     Resp 03/31/24 1909 20     Temp 03/31/24 1909 98.1 F (36.7 C)     Temp Source 03/31/24 1909 Oral     SpO2 03/31/24 1909 99 %     Weight --      Height --      Head Circumference --      Peak Flow --      Pain Score 03/31/24 1908 8     Pain Loc --      Pain Education --      Exclude from Growth Chart --    No data found.  Updated Vital Signs BP 122/83 (BP Location: Right Arm)   Pulse 85   Temp 98.1 F (36.7 C) (Oral)   Resp 20   LMP 03/14/2024 (Approximate)   SpO2 99%   Visual Acuity Right Eye Distance:   Left Eye Distance:   Bilateral Distance:    Right Eye Near:   Left Eye Near:    Bilateral Near:     Physical Exam Vitals and nursing note reviewed.  Constitutional:      General: She is not in acute distress.    Appearance: Normal appearance.  HENT:     Head: Normocephalic.  Cardiovascular:     Pulses: Normal pulses.     Heart sounds: Normal heart sounds.  Pulmonary:  Effort: Pulmonary effort is normal.     Breath sounds: Normal breath sounds.  Abdominal:     General: Bowel sounds are normal.     Palpations:  Abdomen is soft.  Musculoskeletal:       Legs:  Skin:    General: Skin is warm and dry.  Neurological:     General: No focal deficit present.     Mental Status: She is alert and oriented to person, place, and time.  Psychiatric:        Mood and Affect: Mood normal.        Behavior: Behavior normal.      UC Treatments / Results  Labs (all labs ordered are listed, but only abnormal results are displayed) Labs Reviewed  POCT URINALYSIS DIP (MANUAL ENTRY)  CERVICOVAGINAL ANCILLARY ONLY    EKG   Radiology No results found.  Procedures Procedures (including critical care time)  Medications Ordered in UC Medications  ketorolac  (TORADOL ) 30 MG/ML injection 30 mg (30 mg Intramuscular Given 03/31/24 1929)    Initial Impression / Assessment and Plan / UC Course  I have reviewed the triage vital signs and the nursing notes.  Pertinent labs & imaging results that were available during my care of the patient were reviewed by me and considered in my medical decision making (see chart for details).  Urinalysis is negative for any abnormalities or signs of UTI.  Cytology swab is pending.  With regard to patient's low back pain, suspect patient may be experiencing low back pain with sciatica.  No red flag symptoms noted on exam.  Urinalysis does not indicate the rationale for patient's urinary symptoms.  Toradol  30 mg IM administered.  Will start patient on prednisone  40 mg for the next 5 days, along with tizanidine  4 mg as a muscle relaxer.  Supportive care recommendations were provided and discussed with the patient to include over-the-counter analgesics, the use of ice or heat, and stretching exercises.  Patient advised to follow-up with her PCP regarding her continued urinary symptoms, and to follow-up with PCP if back pain does not improve.  Patient was given strict ER follow-up precautions.  Patient was in agreement with this plan of care and verbalizes understanding.  All questions  were answered.  Patient stable for discharge.  Final Clinical Impressions(s) / UC Diagnoses   Final diagnoses:  Vaginal discharge  Bilateral low back pain with left-sided sciatica, unspecified chronicity  Urinary frequency     Discharge Instructions      You were given an injection of Toradol  30 mg.  Do not take any additional NSAIDs today to include ibuprofen , Advil , Aleve , or naproxen .  May take over-the-counter Tylenol  for breakthrough pain or discomfort. Cytology swab is pending.  You will be contacted if the pending test results are abnormal.  You will also access to your results via MyChart. Take medication as prescribed. Increase fluids and allow for plenty of rest.  Make sure you drink at least 8-10 8 ounce glasses of water daily. Develop a toileting schedule that will allow you to urinate at least every 2 hours. Gentle stretching exercises are recommended.  I provided exercises for you to perform at least 2-3 times daily while symptoms persist. Continue the use of ice or heat as needed.  Apply ice for pain or swelling, heat for spasm or stiffness.  Apply for 20 minutes, remove for 1 hour, repeat as needed. Follow-up with your primary care physician for worsening or continued urinary symptoms or if back pain  does not improve. Go to the emergency department immediately if you experience loss of your bowel or bladder function, weakness in your legs or feet, or if you become unable to walk. Follow-up as needed.      ED Prescriptions     Medication Sig Dispense Auth. Provider   tiZANidine  (ZANAFLEX ) 4 MG tablet Take 1 tablet (4 mg total) by mouth every 8 (eight) hours as needed for muscle spasms. 20 tablet Leath-Warren, Belen Bowers, NP   diclofenac (VOLTAREN) 50 MG EC tablet Take 1 tablet (50 mg total) by mouth 2 (two) times daily. 20 tablet Leath-Warren, Belen Bowers, NP      PDMP not reviewed this encounter.   Hardy Lia, NP 03/31/24 1939

## 2024-04-01 ENCOUNTER — Ambulatory Visit: Payer: Self-pay

## 2024-04-01 LAB — CERVICOVAGINAL ANCILLARY ONLY
Bacterial Vaginitis (gardnerella): NEGATIVE
Candida Glabrata: NEGATIVE
Candida Vaginitis: NEGATIVE
Comment: NEGATIVE
Comment: NEGATIVE
Comment: NEGATIVE

## 2024-04-03 ENCOUNTER — Other Ambulatory Visit: Payer: Self-pay | Admitting: Nurse Practitioner

## 2024-04-03 DIAGNOSIS — G8929 Other chronic pain: Secondary | ICD-10-CM

## 2024-04-19 ENCOUNTER — Other Ambulatory Visit: Payer: Self-pay | Admitting: Nurse Practitioner

## 2024-04-19 MED ORDER — KETOCONAZOLE 2 % EX CREA: 1.0000 | TOPICAL_CREAM | Freq: Two times a day (BID) | CUTANEOUS | 0 refills | Status: DC

## 2024-04-19 MED ORDER — TRIAMCINOLONE ACETONIDE 0.1 % EX CREA: 1.0000 | TOPICAL_CREAM | Freq: Two times a day (BID) | CUTANEOUS | 0 refills | Status: DC

## 2024-04-19 NOTE — Progress Notes (Signed)
 Patient seen today with her daughter. C/o rash on the lower leg for a few days. States it does not bother her. Slightly raised patch of dry skin noted with faint pink papules at the periphery. No central clearing. Prescribed Triamcinolone  and ketoconazole  as directed. Office visit if no improvement.

## 2024-04-21 ENCOUNTER — Encounter: Payer: Self-pay | Admitting: Physician Assistant

## 2024-04-21 ENCOUNTER — Ambulatory Visit: Admitting: Physician Assistant

## 2024-04-21 VITALS — BP 108/71 | HR 79 | Temp 98.1°F | Ht 60.0 in | Wt 142.2 lb

## 2024-04-21 DIAGNOSIS — M778 Other enthesopathies, not elsewhere classified: Secondary | ICD-10-CM | POA: Diagnosis not present

## 2024-04-21 NOTE — Progress Notes (Signed)
   Acute Office Visit  Subjective:     Patient ID: Rebecca [name], female    DOB: 16-Jul-1986, 38 y.o.   MRN: [id number]   Patient presents today for concerns of right lateral elbow swelling and tenderness. She states symptoms began yesterday and she was seen in urgent care. At that time she was told to ice area of concern and follow up with PCP. Today she reports symptoms are somewhat improved. She reports decreased swelling and less tenderness today. No numbness or tingling. No weakness or decreased ROM. Has been taking ibuprofen  at home.      Review of Systems  Constitutional:  Negative for chills, fever and malaise/fatigue.  Musculoskeletal:  Positive for joint pain. Negative for back pain, falls and neck pain.  Neurological:  Negative for tingling and weakness.        Objective:     BP 108/71   Pulse 79   Temp 98.1 F (36.7 C)   Ht 5' (1.524 m)   Wt 142 lb 3.2 oz (64.5 kg)   LMP 03/14/2024 (Approximate)   SpO2 99%   BMI 27.77 kg/m   Physical Exam Constitutional:      General: She is not in acute distress.    Appearance: Normal appearance. She is normal weight.  HENT:     Head: Normocephalic and atraumatic.     Mouth/Throat:     Mouth: Mucous membranes are moist.     Pharynx: Oropharynx is clear.  Eyes:     Extraocular Movements: Extraocular movements intact.     Conjunctiva/sclera: Conjunctivae normal.  Cardiovascular:     Rate and Rhythm: Normal rate and regular rhythm.     Pulses: Normal pulses.     Heart sounds: Normal heart sounds. No murmur heard. Pulmonary:     Effort: Pulmonary effort is normal.     Breath sounds: Normal breath sounds.  Musculoskeletal:     Right elbow: No swelling. Normal range of motion. No tenderness. No medial epicondyle or lateral epicondyle tenderness.     Right forearm: Tenderness present. No swelling or bony tenderness.  Skin:    General: Skin is warm and dry.     Capillary Refill: Capillary refill takes less than 2  seconds.  Neurological:     General: No focal deficit present.     Mental Status: She is alert.     Sensory: No sensory deficit.     Motor: No weakness.     No results found for any visits on 04/21/24.      Assessment & Plan:  Right elbow tendinitis Assessment & Plan: This patient presents with right elbow pain. Considered, but doubt, acute fracture including open fracture. Low index of suspicion for a dislocation. Doubt other acute causes of pain at this time. Pain control with ice and tylenol /ibuprofen  as needed.  Reassess/follow up in 2-3 weeks if symptoms do not improve or sooner as discussed.  WBAT/activity as tolerated.     Return if symptoms worsen or fail to improve.  Jearlean Mince Ellamae Lybeck, PA-C

## 2024-04-21 NOTE — Assessment & Plan Note (Signed)
 This patient presents with right elbow pain. Considered, but doubt, acute fracture including open fracture. Low index of suspicion for a dislocation. Doubt other acute causes of pain at this time. Pain control with ice and tylenol /ibuprofen  as needed.  Reassess/follow up in 2-3 weeks if symptoms do not improve or sooner as discussed.  WBAT/activity as tolerated.

## 2024-05-06 ENCOUNTER — Other Ambulatory Visit: Payer: Self-pay | Admitting: Family Medicine

## 2024-05-06 ENCOUNTER — Other Ambulatory Visit: Payer: Self-pay | Admitting: Adult Health

## 2024-05-07 ENCOUNTER — Encounter: Payer: Self-pay | Admitting: Nurse Practitioner

## 2024-05-09 ENCOUNTER — Other Ambulatory Visit: Payer: Self-pay | Admitting: Family Medicine

## 2024-05-27 ENCOUNTER — Encounter: Payer: Self-pay | Admitting: Family Medicine

## 2024-05-27 ENCOUNTER — Ambulatory Visit (INDEPENDENT_AMBULATORY_CARE_PROVIDER_SITE_OTHER): Admitting: Family Medicine

## 2024-05-27 VITALS — BP 120/74 | HR 80 | Temp 97.8°F | Ht 60.0 in | Wt 143.0 lb

## 2024-05-27 DIAGNOSIS — L0291 Cutaneous abscess, unspecified: Secondary | ICD-10-CM | POA: Diagnosis not present

## 2024-05-27 MED ORDER — DOXYCYCLINE HYCLATE 100 MG PO TABS: 100.0000 mg | ORAL_TABLET | Freq: Two times a day (BID) | ORAL | 0 refills | Status: DC

## 2024-05-27 NOTE — Progress Notes (Signed)
 Subjective:  Patient ID: Rebecca [name], female    DOB: 1986/11/25  Age: 38 y.o. MRN: [id number]  CC:   Chief Complaint  Patient presents with   Skin Problem    Boil on right upper leg. Started last Sunday. Has tried compresses, heat and ice, warm bath and otc medications    HPI:  38 year old female presents with an abscess.  She states that it started a week ago.  It is located at the right perineal region.  Does not affect the labia.  She states that she has tried warm compresses and over-the-counter medication without resolution.  She has had this issue previously.  No fever.  No other complaints.  Patient Active Problem List   Diagnosis Date Noted   Abscess 05/27/2024   Gastroesophageal reflux disease without esophagitis 03/01/2024   Recurrent UTI 08/06/2023   Hidradenitis suppurativa 05/23/2023   Encounter for removal and reinsertion of Nexplanon  01/14/2023   Irritable bowel syndrome with both constipation and diarrhea 08/20/2021   OAB (overactive bladder) 04/25/2020   Dyslipidemia (high LDL; low HDL) 09/17/2019   Smoker 05/14/2016   Depression with anxiety 04/05/2013    Social Hx   Social History   Socioeconomic History   Marital status: Married    Spouse name: Not on file   Number of children: 3   Years of education: Not on file   Highest education level: 9th grade  Occupational History   Not on file  Tobacco Use   Smoking status: Every Day    Current packs/day: 0.50    Average packs/day: 0.5 packs/day for 15.0 years (7.5 ttl pk-yrs)    Types: Cigarettes    Passive exposure: Current   Smokeless tobacco: Never  Vaping Use   Vaping status: Never Used  Substance and Sexual Activity   Alcohol use: No   Drug use: Never    Types: Hydrocodone    Sexual activity: Yes    Birth control/protection: Surgical    Comment: Husband had a vasectomy  Other Topics Concern   Not on file  Social History Narrative   Not on file   Social Drivers of Health    Financial Resource Strain: Low Risk  (03/01/2024)   Overall Financial Resource Strain (CARDIA)    Difficulty of Paying Living Expenses: Not hard at all  Food Insecurity: No Food Insecurity (03/01/2024)   Hunger Vital Sign    Worried About Running Out of Food in the Last Year: Never true    Ran Out of Food in the Last Year: Never true  Transportation Needs: No Transportation Needs (03/01/2024)   PRAPARE - Administrator, Civil Service (Medical): No    Lack of Transportation (Non-Medical): No  Physical Activity: Sufficiently Active (03/01/2024)   Exercise Vital Sign    Days of Exercise per Week: 5 days    Minutes of Exercise per Session: 60 min  Stress: No Stress Concern Present (03/01/2024)   [employer] of Occupational Health - Occupational Stress Questionnaire    Feeling of Stress : Not at all  Social Connections: Moderately Isolated (03/01/2024)   Social Connection and Isolation Panel    Frequency of Communication with Friends and Family: More than three times a week    Frequency of Social Gatherings with Friends and Family: Twice a week    Attends Religious Services: Never    Database administrator or Organizations: No    Attends Banker Meetings: Never    Marital Status: Married  Review of Systems Per HPI  Objective:  BP 120/74   Pulse 80   Temp 97.8 F (36.6 C)   Ht 5' (1.524 m)   Wt 143 lb (64.9 kg)   BMI 27.93 kg/m      05/27/2024    9:59 AM 04/21/2024   10:17 AM 03/31/2024    7:09 PM  BP/Weight  Systolic BP 120 108 122  Diastolic BP 74 71 83  Wt. (Lbs) 143 142.2   BMI 27.93 kg/m2 27.77 kg/m2     Physical Exam Vitals and nursing note reviewed. Exam conducted with a chaperone present (Betzy present for exam, Stefanie present for procedure.).  Constitutional:      General: She is not in acute distress.    Appearance: Normal appearance.  HENT:     Head: Normocephalic and atraumatic.  Pulmonary:     Effort: Pulmonary effort is  normal. No respiratory distress.  Genitourinary:     Comments: Small erythematous fluctuant area which is tender to palpation (labeled location).  Neurological:     Mental Status: She is alert.   Psychiatric:        Mood and Affect: Mood normal.        Behavior: Behavior normal.     Lab Results  Component Value Date   WBC 8.0 07/10/2023   HGB 14.1 07/10/2023   HCT 41.7 07/10/2023   PLT 182 07/10/2023   GLUCOSE 89 07/10/2023   CHOL 146 07/10/2023   TRIG 110 07/10/2023   HDL 33 (L) 07/10/2023   LDLCALC 93 07/10/2023   ALT 31 07/10/2023   AST 19 07/10/2023   NA 141 07/10/2023   K 3.8 07/10/2023   CL 107 (H) 07/10/2023   CREATININE 0.75 07/10/2023   BUN 4 (L) 07/10/2023   CO2 22 07/10/2023   TSH 1.010 07/10/2023   I & D  Date/Time: 05/27/2024 1:04 PM  Performed by: Suheyla Mortellaro G, DO Authorized by: Bluford Jacqulyn MATSU, DO   Consent:    Consent obtained:  Verbal   Consent given by:  Patient   Alternatives discussed:  Alternative treatment Location:    Type:  Abscess   Location:  Pelvic Pre-procedure details:    Skin preparation:  Povidone-iodine Sedation:    Sedation type:  None Anesthesia:    Anesthesia method:  Local infiltration   Local anesthetic:  Lidocaine  1% WITH epi Procedure type:    Complexity:  Simple Procedure details:    Incision types:  Stab incision   Scalpel blade:  11   Drainage:  Purulent   Drainage amount: Small amount.   Packing materials:  None Post-procedure details:    Procedure completion:  Tolerated well, no immediate complications    Assessment & Plan:  Abscess Assessment & Plan: Incision and drainage performed today.  Advised to use chlorhexidine in the future to help prevent.  Placing on doxycycline .  Orders: -     Doxycycline  Hyclate; Take 1 tablet (100 mg total) by mouth 2 (two) times daily.  Dispense: 14 tablet; Refill: 0 -     I & D    Follow-up:  Return if symptoms worsen or fail to improve.  Jacqulyn Bluford  DO [facility]

## 2024-05-27 NOTE — Patient Instructions (Signed)
 Hibiclens (OTC).  Medication as prescribed.  Take care  Dr. Bluford

## 2024-05-27 NOTE — Assessment & Plan Note (Signed)
 Incision and drainage performed today.  Advised to use chlorhexidine in the future to help prevent.  Placing on doxycycline .

## 2024-06-07 ENCOUNTER — Other Ambulatory Visit: Payer: Self-pay | Admitting: Nurse Practitioner

## 2024-06-07 ENCOUNTER — Other Ambulatory Visit: Payer: Self-pay | Admitting: Family Medicine

## 2024-06-07 ENCOUNTER — Encounter: Payer: Self-pay | Admitting: Nurse Practitioner

## 2024-06-07 ENCOUNTER — Encounter: Payer: Self-pay | Admitting: Family Medicine

## 2024-06-09 ENCOUNTER — Encounter: Payer: Self-pay | Admitting: Nurse Practitioner

## 2024-06-10 NOTE — Telephone Encounter (Signed)
 Nurses It is possible that the antibiotic she was on recently has triggered thrush I would recommend Diflucan  150 mg 1 p.o. x 1 I would also recommend nystatin oral solution 5 mL swish and swallow 4 times daily for 7 days Please send in 160 mL Please also send in the Diflucan  and notify patient If she has ongoing troubles please follow-up thanks-Dr. Glendia Javen Ridings-covering for Dr. Bluford

## 2024-06-11 ENCOUNTER — Other Ambulatory Visit: Payer: Self-pay

## 2024-06-11 MED ORDER — FLUCONAZOLE 150 MG PO TABS: 150.0000 mg | ORAL_TABLET | Freq: Once | ORAL | 0 refills | Status: AC

## 2024-06-11 MED ORDER — NYSTATIN 100000 UNIT/ML MT SUSP: OROMUCOSAL | 0 refills | Status: DC

## 2024-06-16 ENCOUNTER — Other Ambulatory Visit: Payer: Self-pay | Admitting: Family Medicine

## 2024-06-16 ENCOUNTER — Encounter: Payer: Self-pay | Admitting: Family Medicine

## 2024-06-16 ENCOUNTER — Ambulatory Visit: Admitting: Family Medicine

## 2024-06-16 VITALS — BP 127/86 | HR 84 | Temp 98.4°F | Ht 60.0 in | Wt 141.0 lb

## 2024-06-16 DIAGNOSIS — R1030 Lower abdominal pain, unspecified: Secondary | ICD-10-CM | POA: Insufficient documentation

## 2024-06-16 DIAGNOSIS — R350 Frequency of micturition: Secondary | ICD-10-CM

## 2024-06-16 LAB — POCT URINALYSIS DIP (CLINITEK)
Bilirubin, UA: NEGATIVE
Blood, UA: NEGATIVE
Glucose, UA: NEGATIVE mg/dL
Ketones, POC UA: NEGATIVE mg/dL
Leukocytes, UA: NEGATIVE
Nitrite, UA: NEGATIVE
POC PROTEIN,UA: NEGATIVE
Spec Grav, UA: 1.01 (ref 1.010–1.025)
Urobilinogen, UA: 0.2 U/dL
pH, UA: 7 (ref 5.0–8.0)

## 2024-06-16 MED ORDER — HYDROCODONE-ACETAMINOPHEN 5-325 MG PO TABS: 1.0000 | ORAL_TABLET | Freq: Three times a day (TID) | ORAL | 0 refills | Status: DC | PRN

## 2024-06-16 MED ORDER — ONDANSETRON 4 MG PO TBDP: 4.0000 mg | ORAL_TABLET | Freq: Three times a day (TID) | ORAL | 0 refills | Status: DC | PRN

## 2024-06-16 NOTE — Progress Notes (Signed)
 Subjective:  Patient ID: Rebecca [name], female    DOB: 1986/05/29  Age: 38 y.o. MRN: [id number]  CC:   Chief Complaint  Patient presents with   Back Pain    Lower back with pain spread across lower back and abdominal pain, urine freq and odor , nausea since last night  Hx of sciatic and kidney stones    HPI:  38 year old female presents for evaluation of the above.  Symptoms started abruptly yesterday afternoon around 4 pm. She reports nausea, lower abdominal pain, nausea.  Also having back pain which radiates down the left leg.  No flank pain.  No fever.  Patient had 1 episode of vomiting this morning.  She threw up water.  Patient states that she has had some urinary frequency and urgency.  She is unsure if she has UTI.  She has used heat and ibuprofen  without relief.  Symptoms are severe.  Patient Active Problem List   Diagnosis Date Noted   Lower abdominal pain 06/16/2024   Gastroesophageal reflux disease without esophagitis 03/01/2024   Recurrent UTI 08/06/2023   Hidradenitis suppurativa 05/23/2023   Encounter for removal and reinsertion of Nexplanon  01/14/2023   Irritable bowel syndrome with both constipation and diarrhea 08/20/2021   OAB (overactive bladder) 04/25/2020   Dyslipidemia (high LDL; low HDL) 09/17/2019   Smoker 05/14/2016   Depression with anxiety 04/05/2013    Social Hx   Social History   Socioeconomic History   Marital status: Married    Spouse name: Not on file   Number of children: 3   Years of education: Not on file   Highest education level: 9th grade  Occupational History   Not on file  Tobacco Use   Smoking status: Every Day    Current packs/day: 0.50    Average packs/day: 0.5 packs/day for 15.0 years (7.5 ttl pk-yrs)    Types: Cigarettes    Passive exposure: Current   Smokeless tobacco: Never  Vaping Use   Vaping status: Never Used  Substance and Sexual Activity   Alcohol use: No   Drug use: Never    Types: Hydrocodone    Sexual  activity: Yes    Birth control/protection: Surgical    Comment: Husband had a vasectomy  Other Topics Concern   Not on file  Social History Narrative   Not on file   Social Drivers of Health   Financial Resource Strain: Low Risk  (03/01/2024)   Overall Financial Resource Strain (CARDIA)    Difficulty of Paying Living Expenses: Not hard at all  Food Insecurity: No Food Insecurity (03/01/2024)   Hunger Vital Sign    Worried About Running Out of Food in the Last Year: Never true    Ran Out of Food in the Last Year: Never true  Transportation Needs: No Transportation Needs (03/01/2024)   PRAPARE - Administrator, Civil Service (Medical): No    Lack of Transportation (Non-Medical): No  Physical Activity: Sufficiently Active (03/01/2024)   Exercise Vital Sign    Days of Exercise per Week: 5 days    Minutes of Exercise per Session: 60 min  Stress: No Stress Concern Present (03/01/2024)   [employer] of Occupational Health - Occupational Stress Questionnaire    Feeling of Stress : Not at all  Social Connections: Moderately Isolated (03/01/2024)   Social Connection and Isolation Panel    Frequency of Communication with Friends and Family: More than three times a week    Frequency of  Social Gatherings with Friends and Family: Twice a week    Attends Religious Services: Never    Diplomatic Services operational officer: No    Attends Engineer, structural: Never    Marital Status: Married    Review of Systems Per HPI  Objective:  BP 127/86   Pulse 84   Temp 98.4 F (36.9 C)   Ht 5' (1.524 m)   Wt 141 lb (64 kg)   SpO2 100%   BMI 27.54 kg/m      06/16/2024   11:27 AM 05/27/2024    9:59 AM 04/21/2024   10:17 AM  BP/Weight  Systolic BP 127 120 108  Diastolic BP 86 74 71  Wt. (Lbs) 141 143 142.2  BMI 27.54 kg/m2 27.93 kg/m2 27.77 kg/m2    Physical Exam Vitals and nursing note reviewed.  Constitutional:      General: She is not in acute distress.     Appearance: Normal appearance.  HENT:     Head: Normocephalic and atraumatic.  Cardiovascular:     Rate and Rhythm: Normal rate and regular rhythm.  Pulmonary:     Effort: Pulmonary effort is normal.     Breath sounds: Normal breath sounds. No wheezing or rales.  Abdominal:     General: There is no distension.     Palpations: Abdomen is soft.     Comments: Tender to palpation in the left lower quadrant, suprapubic region, and right lower quadrant.  Neurological:     Mental Status: She is alert.     Lab Results  Component Value Date   WBC 8.0 07/10/2023   HGB 14.1 07/10/2023   HCT 41.7 07/10/2023   PLT 182 07/10/2023   GLUCOSE 89 07/10/2023   CHOL 146 07/10/2023   TRIG 110 07/10/2023   HDL 33 (L) 07/10/2023   LDLCALC 93 07/10/2023   ALT 31 07/10/2023   AST 19 07/10/2023   NA 141 07/10/2023   K 3.8 07/10/2023   CL 107 (H) 07/10/2023   CREATININE 0.75 07/10/2023   BUN 4 (L) 07/10/2023   CO2 22 07/10/2023   TSH 1.010 07/10/2023     Assessment & Plan:  Lower abdominal pain Assessment & Plan: UA is clear.  No hematuria or pyuria.  This does not appear to be secondary to UTI.  She is not having flank pain to suggest underlying kidney stone.  She is having severe abdominal pain, nausea, and significant back pain as well.  I am less concerned about the back and more concerned about the abdominal pain.  Zofran  as needed for nausea.  Needs a CT scan for further evaluation.  Orders: -     CT ABDOMEN PELVIS WO CONTRAST  Urinary frequency -     POCT URINALYSIS DIP (CLINITEK)  Other orders -     Ondansetron ; Take 1 tablet (4 mg total) by mouth every 8 (eight) hours as needed for nausea or vomiting.  Dispense: 20 tablet; Refill: 0    Follow-up:  Pending work up.  Jacqulyn Ahle DO [facility]

## 2024-06-16 NOTE — Patient Instructions (Signed)
 Medication as directed.  Working on CT scan.

## 2024-06-16 NOTE — Assessment & Plan Note (Signed)
 UA is clear.  No hematuria or pyuria.  This does not appear to be secondary to UTI.  She is not having flank pain to suggest underlying kidney stone.  She is having severe abdominal pain, nausea, and significant back pain as well.  I am less concerned about the back and more concerned about the abdominal pain.  Zofran  as needed for nausea.  Needs a CT scan for further evaluation.

## 2024-06-17 ENCOUNTER — Encounter (HOSPITAL_BASED_OUTPATIENT_CLINIC_OR_DEPARTMENT_OTHER): Payer: Self-pay

## 2024-06-17 ENCOUNTER — Ambulatory Visit: Payer: Self-pay | Admitting: Family Medicine

## 2024-06-17 ENCOUNTER — Encounter: Payer: Self-pay | Admitting: Family Medicine

## 2024-06-17 ENCOUNTER — Ambulatory Visit (HOSPITAL_BASED_OUTPATIENT_CLINIC_OR_DEPARTMENT_OTHER)
Admission: RE | Admit: 2024-06-17 | Discharge: 2024-06-17 | Disposition: A | Discharge status: Home / Self Care | Source: Ambulatory Visit | Attending: Family Medicine | Admitting: Family Medicine

## 2024-06-17 DIAGNOSIS — R1030 Lower abdominal pain, unspecified: Secondary | ICD-10-CM | POA: Insufficient documentation

## 2024-07-09 ENCOUNTER — Telehealth: Payer: Self-pay | Admitting: Family Medicine

## 2024-07-09 ENCOUNTER — Other Ambulatory Visit: Payer: Self-pay | Admitting: Family Medicine

## 2024-07-09 MED ORDER — ALPRAZOLAM 0.5 MG PO TABS: 0.5000 mg | ORAL_TABLET | Freq: Two times a day (BID) | ORAL | 0 refills | Status: DC | PRN

## 2024-07-09 NOTE — Telephone Encounter (Signed)
 Received request for alprazolam  refill this was sent in to her pharmacy  She does need to do a regular follow-up office visit with either Dr. Bluford or with [facility] toward the end of the year regarding this medication because it is controlled

## 2024-07-19 ENCOUNTER — Telehealth: Payer: Self-pay | Admitting: Pharmacy Technician

## 2024-07-19 ENCOUNTER — Encounter: Payer: Self-pay | Admitting: Nurse Practitioner

## 2024-07-19 ENCOUNTER — Other Ambulatory Visit (HOSPITAL_COMMUNITY): Payer: Self-pay

## 2024-07-19 ENCOUNTER — Ambulatory Visit (INDEPENDENT_AMBULATORY_CARE_PROVIDER_SITE_OTHER): Admitting: Nurse Practitioner

## 2024-07-19 VITALS — BP 113/83 | HR 61 | Temp 98.0°F | Ht 60.0 in | Wt 141.0 lb

## 2024-07-19 DIAGNOSIS — E785 Hyperlipidemia, unspecified: Secondary | ICD-10-CM

## 2024-07-19 DIAGNOSIS — M533 Sacrococcygeal disorders, not elsewhere classified: Secondary | ICD-10-CM

## 2024-07-19 DIAGNOSIS — F418 Other specified anxiety disorders: Secondary | ICD-10-CM | POA: Diagnosis not present

## 2024-07-19 DIAGNOSIS — R5383 Other fatigue: Secondary | ICD-10-CM | POA: Insufficient documentation

## 2024-07-19 DIAGNOSIS — L732 Hidradenitis suppurativa: Secondary | ICD-10-CM | POA: Diagnosis not present

## 2024-07-19 DIAGNOSIS — R102 Pelvic and perineal pain unspecified side: Secondary | ICD-10-CM | POA: Insufficient documentation

## 2024-07-19 DIAGNOSIS — N939 Abnormal uterine and vaginal bleeding, unspecified: Secondary | ICD-10-CM

## 2024-07-19 DIAGNOSIS — K219 Gastro-esophageal reflux disease without esophagitis: Secondary | ICD-10-CM

## 2024-07-19 DIAGNOSIS — K580 Irritable bowel syndrome with diarrhea: Secondary | ICD-10-CM | POA: Diagnosis not present

## 2024-07-19 DIAGNOSIS — F4322 Adjustment disorder with anxiety: Secondary | ICD-10-CM | POA: Insufficient documentation

## 2024-07-19 MED ORDER — SERTRALINE HCL 50 MG PO TABS: 50.0000 mg | ORAL_TABLET | Freq: Every day | ORAL | 0 refills | Status: DC

## 2024-07-19 MED ORDER — DOXYCYCLINE HYCLATE 100 MG PO CAPS
100.0000 mg | ORAL_CAPSULE | Freq: Two times a day (BID) | ORAL | 2 refills | Status: DC
Start: 2024-07-19 — End: 2024-09-16

## 2024-07-19 MED ORDER — ALPRAZOLAM 1 MG PO TABS: 1.0000 mg | ORAL_TABLET | Freq: Two times a day (BID) | ORAL | 2 refills | Status: DC | PRN

## 2024-07-19 MED ORDER — OMEPRAZOLE 20 MG PO CPDR: DELAYED_RELEASE_CAPSULE | ORAL | 2 refills | Status: AC

## 2024-07-19 NOTE — Telephone Encounter (Signed)
 Pharmacy Patient Advocate Encounter   Received notification from CoverMyMeds that prior authorization for Omeprazole  20MG  dr capsules  is required/requested.   Insurance verification completed.   The patient is insured through [employer] .   Per test claim: Not covered for age 39 or older. I will call pt's pharmacy.

## 2024-07-19 NOTE — Progress Notes (Signed)
 Subjective:    Patient ID: Rebecca [name], female    DOB: 1986/09/18, 38 y.o.   MRN: [id number]  HPI Discussed the use of AI scribe software for clinical note transcription with the patient, who gave verbal consent to proceed.  History of Present Illness Rebecca [name] is a 38 year old female who presents for follow-up regarding her anxiety medication management.  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 She feels physically, emotionally, and mentally drained. She takes alprazolam  1 mg twice daily for anxiety. She has a history of using sertraline  for anxiety, which she previously stopped at a dose of 50 mg due to insufficient effect.  She experiences two menstrual periods per month, with the last occurrences on the 4th and 26th of the previous month. Her breasts feel full and heavy, which she attributes to hormonal changes. She is not on any birth control as her husband has had a vasectomy. She has a history of anemia and is concerned about becoming anemic again due to her menstrual cycle.  She reports chronic pain in her coccyx area, which is particularly severe in the mornings and affects her ability to get out of bed. She has tried ibuprofen  800 mg and topical treatments like lidocaine  and Bengay with some relief. A CT scan of the abdomen and pelvis showed no fractures or abnormalities.  She experiences frequent bowel movements, up to six times a day, often watery, with only one solid stool per day. She takes omeprazole  twice daily for acid reflux, which is under control.  She has a history of hidradenitis suppurativa, primarily in the groin area, and uses doxycycline  as needed for flare-ups. She also reports a white coating on her tongue, which she associates with a previous antibiotic course. Took one dose of Fluconazole . Non tender. No other oral rashes.   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 No chest pain, shortness of breath, or wheezing.     Review of Systems  Constitutional:  Positive for fatigue.  HENT:  Negative for sore throat and trouble swallowing.   Respiratory:  Negative for cough, chest tightness, shortness of breath and wheezing.   Cardiovascular:  Negative for chest pain and leg swelling.  Gastrointestinal:  Positive for abdominal pain and diarrhea. Negative for blood in stool, constipation, nausea and vomiting.  Skin:  Positive for rash.       Resolving infected bump in the perirectal area.       07/19/2024   10:13 AM  Depression screen PHQ 2/9  Decreased Interest 0  Down, Depressed, Hopeless 0  PHQ - 2 Score 0  Altered sleeping 0  Tired, decreased energy 0  Change in appetite 0  Feeling bad or failure about yourself  0  Trouble concentrating 0  Moving slowly or fidgety/restless 0  Suicidal thoughts 0  PHQ-9 Score 0  Difficult doing work/chores Not difficult at all      07/19/2024   10:13 AM 06/16/2024   11:35 AM 05/27/2024   10:06 AM 04/21/2024   10:26 AM  GAD 7 : Generalized Anxiety Score  Nervous, Anxious, on Edge 0 0 0 0  Control/stop worrying 0 0 0 0  Worry too much - different things 0 0 0 0  Trouble relaxing 0 0 0 0  Restless  0 0 0 0  Easily annoyed or irritable 0 0 0 0  Afraid - awful might happen 0 0 0 0  Total GAD 7 Score 0 0 0 0  Anxiety Difficulty Not difficult at all Not difficult at all Not difficult at all     Social History   Tobacco Use   Smoking status: Every Day    Current packs/day: 0.50    Average packs/day: 0.5 packs/day for 15.0 years (7.5 ttl pk-yrs)    Types: Cigarettes    Passive exposure: Current   Smokeless tobacco: Never  Vaping Use   Vaping status: Never Used  Substance Use Topics   Alcohol use: No   Drug use: Never    Types: Hydrocodone         Objective:    Physical Exam Vitals reviewed.  Constitutional:      General: She is not in acute distress. Neck:     Comments: Thyroid non tender to palpation. No mass or goiter noted.  Cardiovascular:     Rate and Rhythm: Normal rate and regular rhythm.     Heart sounds: Normal heart sounds.  Pulmonary:     Effort: Pulmonary effort is normal.     Breath sounds: Normal breath sounds.  Abdominal:     General: There is no distension.     Palpations: Abdomen is soft. There is no mass.     Tenderness: There is no abdominal tenderness.  Musculoskeletal:     Cervical back: Neck supple.  Lymphadenopathy:     Cervical: No cervical adenopathy.  Skin:    General: Skin is warm and dry.     Comments: On small resolving pink papule in the perirectal area.   Neurological:     Mental Status: She is alert and oriented to person, place, and time.  Psychiatric:        Mood and Affect: Mood normal.        Behavior: Behavior normal.        Thought Content: Thought content normal.        Judgment: Judgment normal.     Comments: Making good eye contact. Speech clear. Mildly anxious affect.     Today's Vitals   07/19/24 1006  BP: 113/83  Pulse: 61  Temp: 98 F (36.7 C)  SpO2: 95%  Weight: 141 lb (64 kg)  Height: 5' (1.524 m)   Body mass index is 27.54 kg/m.        Assessment & Plan:  Assessment and Plan Assessment & Plan Problem List Items Addressed This Visit       Digestive   Gastroesophageal reflux disease without esophagitis - Primary   Relevant Medications   omeprazole  (PRILOSEC) 20 MG capsule   Irritable bowel syndrome with both constipation and diarrhea   Relevant Medications   omeprazole  (PRILOSEC) 20 MG capsule     Musculoskeletal and Integument   Hidradenitis suppurativa     Genitourinary   Abnormal uterine bleeding   Relevant Orders   CBC with Differential/Platelet     Other   Adjustment disorder with anxiety   Coccydynia   Depression with anxiety   Relevant  Medications   ALPRAZolam  (XANAX ) 1 MG tablet   sertraline  (ZOLOFT ) 50 MG tablet   Dyslipidemia (high LDL; low HDL)   Relevant Orders   Lipid panel   Fatigue   Relevant Orders   CBC with Differential/Platelet   Comprehensive metabolic panel with GFR   TSH      Depression with anxiety/adjustment disorder  Exacerbated by high stress levels due to recent life changes. Current alprazolam  regimen is insufficient. - Increase sertraline  to 50 mg daily for two weeks, then increase to 100 mg daily. - Continue alprazolam  1 mg twice daily. - Monitor response to sertraline  and adjust dosage as needed. Send message within one month for refills and/or dose change.  - Consider alternative medications if sertraline  is ineffective.  Hidradenitis suppurativa Recurrent boils in the groin area. Previous doxycycline  treatment was prescribed. Chronic condition requiring ongoing management. - Prescribe doxycycline  as needed for flare-ups. - Consider new therapies if condition worsens.  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 Current ibuprofen  provides partial relief. - Continue ibuprofen  800 mg as needed for pain with food. DC if any reflux. Use sparingly.  - Consider topical treatments for additional relief.  AUB/Dysfunctional uterine bleeding, perimenopausal Irregular menstrual cycles with increased frequency, likely due to perimenopausal changes. Potential for anemia due to frequent bleeding. - Monitor menstrual cycle and symptoms. - Discuss progesterone therapy at next visit if symptoms persist.  IBS Diarrhea Frequent watery stools.  - Recommend probiotics such as Align or Activia yogurt.  Gastroesophageal reflux disease (GERD) Managed with omeprazole , currently well-controlled. - Refill omeprazole  prescription for twice daily use.  Labs ordered.   Meds ordered this encounter  Medications   ALPRAZolam  (XANAX ) 1 MG  tablet    Sig: Take 1 tablet (1 mg total) by mouth 2 (two) times daily as needed for anxiety.    Dispense:  60 tablet    Refill:  2    May refill monthly    Supervising Provider:   ALPHONSA HAMILTON A [[id number]]   sertraline  (ZOLOFT ) 50 MG tablet    Sig: Take 1 tablet (50 mg total) by mouth daily.    Dispense:  30 tablet    Refill:  0    Supervising Provider:   ALPHONSA HAMILTON A [[id number]]   omeprazole  (PRILOSEC) 20 MG capsule    Sig: TAKE 1 CAPSULE BY MOUTH TWICE A DAY FOR PRN GERD    Dispense:  60 capsule    Refill:  2    Supervising Provider:   ALPHONSA HAMILTON A [[id number]]   doxycycline  (VIBRAMYCIN ) 100 MG capsule    Sig: Take 1 capsule (100 mg total) by mouth 2 (two) times daily. Prn infection    Dispense:  14 capsule    Refill:  2    Supervising Provider:   ALPHONSA HAMILTON A [[id number]]   Return in about 3 months (around 10/19/2024).

## 2024-07-20 ENCOUNTER — Encounter: Payer: Self-pay | Admitting: Nurse Practitioner

## 2024-07-20 ENCOUNTER — Ambulatory Visit: Payer: Self-pay | Admitting: Nurse Practitioner

## 2024-07-20 LAB — CBC WITH DIFFERENTIAL/PLATELET
Basophils Absolute: 0.1 x10E3/uL (ref 0.0–0.2)
Basos: 1 %
EOS (ABSOLUTE): 0.2 x10E3/uL (ref 0.0–0.4)
Eos: 2 %
Hematocrit: 42.3 % (ref 34.0–46.6)
Hemoglobin: 14.3 g/dL (ref 11.1–15.9)
Immature Grans (Abs): 0 x10E3/uL (ref 0.0–0.1)
Immature Granulocytes: 0 %
Lymphocytes Absolute: 3.5 x10E3/uL — ABNORMAL HIGH (ref 0.7–3.1)
Lymphs: 36 %
MCH: 31.1 pg (ref 26.6–33.0)
MCHC: 33.8 g/dL (ref 31.5–35.7)
MCV: 92 fL (ref 79–97)
Monocytes Absolute: 0.6 x10E3/uL (ref 0.1–0.9)
Monocytes: 6 %
Neutrophils Absolute: 5.5 x10E3/uL (ref 1.4–7.0)
Neutrophils: 55 %
Platelets: 211 x10E3/uL (ref 150–450)
RBC: 4.6 x10E6/uL (ref 3.77–5.28)
RDW: 12.6 % (ref 11.7–15.4)
WBC: 9.7 x10E3/uL (ref 3.4–10.8)

## 2024-07-20 LAB — LIPID PANEL
Chol/HDL Ratio: 5.4 ratio — ABNORMAL HIGH (ref 0.0–4.4)
Cholesterol, Total: 178 mg/dL (ref 100–199)
HDL: 33 mg/dL — ABNORMAL LOW (ref >39)
LDL Chol Calc (NIH): 119 mg/dL — ABNORMAL HIGH (ref 0–99)
Triglycerides: 143 mg/dL (ref 0–149)
VLDL Cholesterol Cal: 26 mg/dL (ref 5–40)

## 2024-07-20 LAB — COMPREHENSIVE METABOLIC PANEL WITH GFR
ALT: 24 IU/L (ref 0–32)
AST: 21 IU/L (ref 0–40)
Albumin: 4.3 g/dL (ref 3.9–4.9)
Alkaline Phosphatase: 113 IU/L (ref 44–121)
BUN/Creatinine Ratio: 7 — ABNORMAL LOW (ref 9–23)
BUN: 5 mg/dL — ABNORMAL LOW (ref 6–20)
Bilirubin Total: 0.3 mg/dL (ref 0.0–1.2)
CO2: 19 mmol/L — ABNORMAL LOW (ref 20–29)
Calcium: 9.5 mg/dL (ref 8.7–10.2)
Chloride: 104 mmol/L (ref 96–106)
Creatinine, Ser: 0.76 mg/dL (ref 0.57–1.00)
Globulin, Total: 2.4 g/dL (ref 1.5–4.5)
Glucose: 79 mg/dL (ref 70–99)
Potassium: 4.2 mmol/L (ref 3.5–5.2)
Sodium: 139 mmol/L (ref 134–144)
Total Protein: 6.7 g/dL (ref 6.0–8.5)
eGFR: 103 mL/min/1.73 (ref >59)

## 2024-07-20 LAB — TSH: TSH: 0.946 u[IU]/mL (ref 0.450–4.500)

## 2024-07-26 ENCOUNTER — Ambulatory Visit: Payer: Self-pay | Admitting: Obstetrics

## 2024-07-26 ENCOUNTER — Encounter: Payer: Self-pay | Admitting: Obstetrics

## 2024-07-26 ENCOUNTER — Ambulatory Visit: Admitting: Obstetrics

## 2024-07-26 VITALS — BP 118/79 | HR 94 | Ht 60.0 in | Wt 141.0 lb

## 2024-07-26 DIAGNOSIS — K582 Mixed irritable bowel syndrome: Secondary | ICD-10-CM

## 2024-07-26 DIAGNOSIS — R3914 Feeling of incomplete bladder emptying: Secondary | ICD-10-CM | POA: Insufficient documentation

## 2024-07-26 DIAGNOSIS — N3281 Overactive bladder: Secondary | ICD-10-CM | POA: Diagnosis not present

## 2024-07-26 DIAGNOSIS — R102 Pelvic and perineal pain: Secondary | ICD-10-CM

## 2024-07-26 DIAGNOSIS — F17200 Nicotine dependence, unspecified, uncomplicated: Secondary | ICD-10-CM

## 2024-07-26 DIAGNOSIS — N941 Unspecified dyspareunia: Secondary | ICD-10-CM

## 2024-07-26 DIAGNOSIS — R8290 Unspecified abnormal findings in urine: Secondary | ICD-10-CM | POA: Insufficient documentation

## 2024-07-26 LAB — POCT URINALYSIS DIP (CLINITEK)
Bilirubin, UA: NEGATIVE
Glucose, UA: NEGATIVE mg/dL
Ketones, POC UA: NEGATIVE mg/dL
Leukocytes, UA: NEGATIVE
Nitrite, UA: NEGATIVE
POC PROTEIN,UA: NEGATIVE
Spec Grav, UA: 1.01 (ref 1.010–1.025)
Urobilinogen, UA: 0.2 U/dL
pH, UA: 6 (ref 5.0–8.0)

## 2024-07-26 MED ORDER — GEMTESA 75 MG PO TABS: 75.0000 mg | ORAL_TABLET | Freq: Every day | ORAL | Status: DC

## 2024-07-26 MED ORDER — GEMTESA 75 MG PO TABS
75.0000 mg | ORAL_TABLET | Freq: Every day | ORAL | 2 refills | Status: DC
Start: 2024-07-26 — End: 2024-08-05

## 2024-07-26 MED ORDER — DICLOFENAC SODIUM 1 % EX GEL: 2.0000 g | Freq: Four times a day (QID) | CUTANEOUS | 0 refills | Status: DC

## 2024-07-26 NOTE — Assessment & Plan Note (Signed)
-   cutdown from 1.5ppd to 0.5ppd/day - encouraged to continue cessation due to association with stress urinary incontinence

## 2024-07-26 NOTE — Assessment & Plan Note (Signed)
-   encouraged diet modification and avoidance of triggers such as diary - continue lactase PRN  - trial of IBGuard PRN diarrhea

## 2024-07-26 NOTE — Assessment & Plan Note (Addendum)
-   The origin of pelvic floor muscle spasm can be multifactorial, including primary, reactive to a different pain source, trauma, or even part of a centralized pain syndrome.Treatment options include pelvic floor physical therapy, local (vaginal) or oral  muscle relaxants, pelvic muscle trigger point injections or centrally acting pain medications.   - referral to pelvic floor PT - Rx Voltaren  gel up to 2g QID PRN pain - reviewed ilioinguinal and SI joint pain, reviewed comfort measures and treatment options.  - consider topical lidocaine  1g PRN pain up to 3x/day or Amitriptyline 2.5%/ gabapentin 2.5%/ baclofen 2.5% in vaginal cream. Sent to compounding pharmacy for use daily - encouraged pelvic binder use for SI Joint pain and continue to sleep with pillow between her legs - avoid tight clothing over lower abdomen

## 2024-07-26 NOTE — Patient Instructions (Addendum)
 We discussed the symptoms of overactive bladder (OAB), which include urinary urgency, urinary frequency, night-time urination, with or without urge incontinence.  We discussed management including behavioral therapy (decreasing bladder irritants by following a bladder diet, urge suppression strategies, timed voids, bladder retraining), physical therapy, medication; and for refractory cases posterior tibial nerve stimulation, sacral neuromodulation, and intravesical botulinum toxin injection.   For Beta-3 agonist medication, we discussed the potential side effect of elevated blood pressure which is more likely to occur in individuals with uncontrolled hypertension. You were given samples for gemtesa  75mg .  It can take a month to start working so give it time, but if you have bothersome side effects call sooner and we can try a different medication.  Call us  if you have trouble filling the prescription or if it's not covered by your insurance.  The origin of pelvic floor muscle spasm can be multifactorial, including primary, reactive to a different pain source, trauma, or even part of a centralized pain syndrome.Treatment options include pelvic floor physical therapy, local (vaginal) or oral  muscle relaxants, pelvic muscle trigger point injections or centrally acting pain medications.    Please call [phone] to schedule the earliest appointment for pelvic floor PT.  Start pelvic binder use and avoid lower abdominal compression due to your ilioinguinal nerve pain  Use Voltaren  gel up to 2g 4x/day as needed for lower abdominal pain.

## 2024-07-26 NOTE — Assessment & Plan Note (Signed)
-   PVR 12mL - repeat if clinical change - likely due to pelvic floor myofacial pain. Referral to pelvic floor PT

## 2024-07-26 NOTE — Assessment & Plan Note (Addendum)
-   POCT + heme, pending UA microscopy and culture. PVR 12mL - We discussed the symptoms of overactive bladder (OAB), which include urinary urgency, urinary frequency, nocturia, with or without urge incontinence.  While we do not know the exact etiology of OAB, several treatment options exist. We discussed management including behavioral therapy (decreasing bladder irritants, urge suppression strategies, timed voids, bladder retraining), physical therapy, medication; for refractory cases posterior tibial nerve stimulation, sacral neuromodulation, and intravesical botulinum toxin injection.  For anticholinergic medications, we discussed the potential side effects of anticholinergics including dry eyes, dry mouth, constipation, cognitive impairment and urinary retention. For Beta-3 agonist medication, we discussed the potential side effect of elevated blood pressure which is more likely to occur in individuals with uncontrolled hypertension. - encouraged caffeine reduction and fluid management - referral to pelvic floor PT - desires to avoid medications. Provided samples for trial of Gemtesa  if refractory after PT and caffeine reduction

## 2024-07-26 NOTE — Assessment & Plan Note (Signed)
-   POCT UA + heme, pending UA microscopy and culture - reports history of rUTI symptoms and LUTS with intermittent negative urine cultures - discouraged antibiotic use in the absence of positive cultures due to risk of resistant uropathogen

## 2024-07-26 NOTE — Assessment & Plan Note (Addendum)
-   pain with deep penetration - bilateral SI Joint pain with palpation and ilioinguinal nerve pain R > L - referred to pelvic floor PT due to pelvic floor myofascial pain - encouraged pelvic floor relaxation exercises and position change during intercourse for comfort - continue heating pad and Ibuprofen  use as needed for musculoskeletal pain

## 2024-07-26 NOTE — Progress Notes (Signed)
 New Patient Evaluation and Consultation  Referring Provider: Mauro Elveria BROCKS, NP PCP: Cook, Jayce G, DO Date of Service: 07/26/2024  SUBJECTIVE Chief Complaint: New Patient (Initial Visit) Rebecca [name] is a 38 y.o. female here today for OAB )  History of Present Illness: Rebecca [name] is a 38 y.o. White or Caucasian female seen in consultation at the request of NP [facility] for evaluation of frequent urination and OAB.    Urinary frequency started after last pregnancy and tinkles when she wipes postvoid.  Tried oxybutynin  10mg  in 2021 with minimal relief and stopped due to dry mouth. Reports suprapubic pain x 1 month that radiates to back and down her legs, started on pain medications. Symptoms started when she was giving her mom a bath due to dementia who lives in her home for the past 3 year. Similar back pain during pregnancy. Daughter with autism whom she cares for and a foster child. Uncle cares for her mother when she is out of the home.   Review of records significant for: History of coccydynia, IBS-D, tobacco use, h/o rUTI, rectocele  CT abd/pelvis w/o contrast 06/17/24 CLINICAL DATA:  Lower abdominal pain.   EXAM: CT ABDOMEN AND PELVIS WITHOUT CONTRAST   TECHNIQUE: Multidetector CT imaging of the abdomen and pelvis was performed following the standard protocol without IV contrast.   RADIATION DOSE REDUCTION: This exam was performed according to the departmental dose-optimization program which includes automated exposure control, adjustment of the mA and/or kV according to patient size and/or use of iterative reconstruction technique.   COMPARISON:  CT abdomen and pelvis 08/08/2023.   FINDINGS: Lower chest: No acute abnormality.   Hepatobiliary: No focal liver abnormality is seen. No gallstones, gallbladder wall thickening, or biliary dilatation.   Pancreas: Unremarkable. No pancreatic ductal dilatation or surrounding inflammatory changes.   Spleen: Normal  in size without focal abnormality.   Adrenals/Urinary Tract: There is a single punctate calculus in the superior pole of the left kidney. Otherwise, the adrenal glands, bladder and kidneys are within normal limits.   Stomach/Bowel: Stomach is within normal limits. Appendix appears normal. No evidence of bowel wall thickening, distention, or inflammatory changes. There is descending and sigmoid colon diverticulosis.   Vascular/Lymphatic: Aortic atherosclerosis. No enlarged abdominal or pelvic lymph nodes.   Reproductive: Uterus and bilateral adnexa are unremarkable.   Other: No abdominal wall hernia or abnormality. No abdominopelvic ascites.   Musculoskeletal: No fracture is seen.   IMPRESSION: 1. No acute localizing process in the abdomen or pelvis. 2. Nonobstructing left renal calculus. 3. Colonic diverticulosis without evidence for diverticulitis. 4. Aortic atherosclerosis.   Aortic Atherosclerosis (ICD10-I70.0).     Electronically Signed   By: Greig Pique M.D.   On: 06/17/2024 17:11   Urinary Symptoms: Leaks urine with cough/ sneeze, laughing, exercise, lifting, going from sitting to standing, during sex, with a full bladder, with movement to the bathroom, with urgency, and without sensation Most bothered by urinary frequency and urgency Leaks 2 time(s) per days with urgency Leaks 1x/week with coughing/sneezing, worse when she jumps on a trampoline  Pad use: 3-4 liners/ mini-pads per day.  Denies night time leakage Patient is bothered by UI symptoms.  Day time voids 15.  Nocturia: 1 times per night to void. Voiding dysfunction:  does not empty bladder well.  Patient does not use a catheter to empty bladder.  When urinating, patient feels the need to urinate multiple times in a row Drinks: 80oz water per day, 24-36oz pepsi, 8oz  of coffee or juice  UTIs: 4 UTI's in the last year.   UTI symptoms: increased frequency, incomplete emptying, malodorous urine, vaginal  discharge and back pain.  Urine cultures are sometimes negative. Ibuprofen  with relief, pyridium  with relief. Denies history of blood in urine, kidney or bladder stones, pyelonephritis, bladder cancer, and kidney cancer No results found for the last 90 days.   Pelvic Organ Prolapse Symptoms:                  Patient Denies a feeling of a bulge the vaginal area.   Bowel Symptom: Bowel movements: 1-3 time(s) per day with water triggered by spicy foods or coffee with milk, ice cream, coffee, and stress  Stool consistency: soft  or loose Straining: no.  Splinting: no.  Incomplete evacuation: no.  Patient Denies accidental bowel leakage / fecal incontinence Bowel regimen: none, lactase with relief PRN every 6 months Last colonoscopy: not due for age based screening [facility] Colonoscopy   This patient has no relevant Health Maintenance data.     Sexual Function Sexually active: yes.  Sexual orientation: Straight Pain with sex: Yes, deep in the pelvis since 1st husband. Stops due to sharp pain described as hitting something Improves with position changes Denies lubrication use  Pelvic Pain Denies pelvic pain   Past Medical History:  Past Medical History:  Diagnosis Date   Anxiety    Back pain    GERD (gastroesophageal reflux disease)    Hyperlipidemia    Smoker 05/14/2016     Past Surgical History:   Past Surgical History:  Procedure Laterality Date   NO PAST SURGERIES       Past OB/GYN History: OB History  Gravida Para Term Preterm AB Living  3 3 2 1  3   SAB IAB Ectopic Multiple Live Births      3    # Outcome Date GA Lbr Len/2nd Weight Sex Type Anes PTL Lv  3 Term 06/23/19 [redacted]w[redacted]d  6 lb 2 oz (2.778 kg) F Vag-Spont None N LIV  2 Term 04/25/07 [redacted]w[redacted]d  6 lb 11 oz (3.033 kg) M Vag-Spont Other Y LIV  1 Preterm 12/19/00 [redacted]w[redacted]d  4 lb 11 oz (2.126 kg) F Vag-Spont Other N LIV     Complications: Preterm premature rupture of membranes (PPROM) delivered, current hospitalization     Vaginal deliveries: 3,  Forceps/ Vacuum deliveries: 0, Cesarean section: 0 Menopausal: No, LMP No LMP recorded. 07/21/24 Contraception: vasectomy. Last pap smear.  Any history of abnormal pap smears: no.    Component Value Date/Time   DIAGPAP  01/10/2023 1139    - Negative for Intraepithelial Lesions or Malignancy (NILM)   DIAGPAP - Benign reactive/reparative changes 01/10/2023 1139   DIAGPAP  06/06/2020 1051    - Negative for intraepithelial lesion or malignancy (NILM)   HPVHIGH Negative 01/10/2023 1139   HPVHIGH Negative 06/06/2020 1051   ADEQPAP  01/10/2023 1139    Satisfactory for evaluation; transformation zone component PRESENT.   ADEQPAP  06/06/2020 1051    Satisfactory for evaluation; transformation zone component PRESENT.   ADEQPAP  07/07/2017 0000    Satisfactory for evaluation  endocervical/transformation zone component PRESENT.    Medications: Patient has a current medication list which includes the following prescription(s): alprazolam , diclofenac  sodium, doxycycline , multivitamin, omeprazole , sertraline , and simvastatin .   Allergies: Patient is allergic to amoxicillin, cephalosporins, septra [bactrim], ciprocinonide [fluocinolone], macrobid  [nitrofurantoin ], and penicillins.   Social History:  Social History   Tobacco Use   Smoking status: Every  Day    Current packs/day: 0.50    Average packs/day: 0.5 packs/day for 15.0 years (7.5 ttl pk-yrs)    Types: Cigarettes    Passive exposure: Current   Smokeless tobacco: Never  Vaping Use   Vaping status: Never Used  Substance Use Topics   Alcohol use: No   Drug use: Never  Cutdown from 1.5 ppd/day  Relationship status: married Patient lives with her husband and kids.   Patient is employed at [employer] for [employer]. Regular exercise: No History of abuse: No  Family History:   Family History  Problem Relation Age of Onset   Other Mother        brain tumor   Dementia Mother    CAD Father    Stroke  Father    Liver disease Father    Alcohol abuse Father    Anxiety disorder Sister    Cancer Maternal Grandmother        lung   Cancer Maternal Grandfather        unsure    Other Daughter        acid reflux   Asthma Son    Bronchitis Son    ADD / ADHD Son    Uterine cancer Neg Hx    Bladder Cancer Neg Hx    Renal cancer Neg Hx      Review of Systems: Review of Systems  Constitutional:  Negative for fever, malaise/fatigue and weight loss.       Weight gain  Respiratory:  Negative for cough, shortness of breath and wheezing.   Cardiovascular:  Negative for chest pain, palpitations and leg swelling.  Gastrointestinal:  Positive for abdominal pain, constipation and diarrhea. Negative for blood in stool.  Genitourinary:  Positive for frequency and urgency. Negative for dysuria and hematuria.       Abnormal menses, leakage  Skin:  Negative for rash.  Neurological:  Negative for dizziness, weakness and headaches.  Endo/Heme/Allergies:  Bruises/bleeds easily.  Psychiatric/Behavioral:  Negative for depression. The patient is nervous/anxious.      OBJECTIVE Physical Exam: Vitals:   07/26/24 1052  BP: 118/79  Pulse: 94  Weight: 141 lb (64 kg)  Height: 5' (1.524 m)   Physical Exam Constitutional:      General: She is not in acute distress.    Appearance: Normal appearance.  Genitourinary:     Bladder and urethral meatus normal.     No lesions in the vagina.     Right Labia: No rash, tenderness, lesions, skin changes or Bartholin's cyst.    Left Labia: No tenderness, lesions, skin changes, Bartholin's cyst or rash.    No vaginal discharge, erythema, tenderness, bleeding, ulceration or granulation tissue.      Right Adnexa: not tender, not full and no mass present.    Left Adnexa: not tender, not full and no mass present.    No cervical motion tenderness, discharge, friability, lesion, polyp or nabothian cyst.     Uterus is not enlarged, fixed, tender or irregular.     No  uterine mass detected.    Urethral meatus caruncle not present.    No urethral prolapse, tenderness, mass, hypermobility or discharge present.     Bladder is not tender, urgency on palpation not present and masses not present.      Levator ani not tender, obturator internus not tender, no asymmetrical contractions present and no pelvic spasms present.    Anal wink present and BC reflex present. Cardiovascular:  Rate and Rhythm: Normal rate.  Pulmonary:     Effort: Pulmonary effort is normal. No respiratory distress.  Abdominal:     General: There is no distension.     Palpations: Abdomen is soft. There is no mass.     Tenderness: There is no abdominal tenderness.     Hernia: No hernia is present.   Musculoskeletal:       Legs:  Neurological:     Mental Status: She is alert.  Vitals reviewed. Exam conducted with a chaperone present.      POP-Q:   POP-Q  -3                                            Aa   -3                                           Ba  -6                                              C   2                                            Gh  3                                            Pb  8                                            tvl   -3                                            Ap  -3                                            Bp  -7                                              D     Post-Void Residual (PVR) by Bladder Scan: In order to evaluate bladder emptying, we discussed obtaining a postvoid residual and patient agreed to this procedure.  Procedure: The ultrasound unit was placed on the patient's abdomen in the suprapubic region after the patient had voided.    Post Void Residual - 07/26/24 1107       Post Void Residual   Post Void Residual 12 mL  Laboratory Results: Lab Results  Component Value Date   COLORU yellow 07/26/2024   CLARITYU clear 07/26/2024   GLUCOSEUR negative 07/26/2024   BILIRUBINUR negative  07/26/2024   KETONESU neg 08/19/2023   SPECGRAV 1.010 07/26/2024   RBCUR trace-intact (A) 07/26/2024   PHUR 6.0 07/26/2024   PROTEINUR negative 03/31/2024   UROBILINOGEN 0.2 07/26/2024   LEUKOCYTESUR Negative 07/26/2024    Lab Results  Component Value Date   CREATININE 0.76 07/19/2024   CREATININE 0.75 07/10/2023   CREATININE 0.82 04/19/2021    No results found for: HGBA1C  Lab Results  Component Value Date   HGB 14.3 07/19/2024     ASSESSMENT AND PLAN Ms. Gauer is a 38 y.o. with:  1. OAB (overactive bladder)   2. Irritable bowel syndrome with both constipation and diarrhea   3. Dyspareunia in female   4. Feeling of incomplete bladder emptying   5. Abnormal urinalysis   6. Pelvic pain   7. Smoker     OAB (overactive bladder) Assessment & Plan: - POCT + heme, pending UA microscopy and culture. PVR 12mL - We discussed the symptoms of overactive bladder (OAB), which include urinary urgency, urinary frequency, nocturia, with or without urge incontinence.  While we do not know the exact etiology of OAB, several treatment options exist. We discussed management including behavioral therapy (decreasing bladder irritants, urge suppression strategies, timed voids, bladder retraining), physical therapy, medication; for refractory cases posterior tibial nerve stimulation, sacral neuromodulation, and intravesical botulinum toxin injection.  For anticholinergic medications, we discussed the potential side effects of anticholinergics including dry eyes, dry mouth, constipation, cognitive impairment and urinary retention. For Beta-3 agonist medication, we discussed the potential side effect of elevated blood pressure which is more likely to occur in individuals with uncontrolled hypertension. - encouraged caffeine reduction and fluid management - referral to pelvic floor PT  Orders: -     POCT URINALYSIS DIP (CLINITEK)  Irritable bowel syndrome with both constipation and  diarrhea Assessment & Plan: - encouraged diet modification and avoidance of triggers such as diary - continue lactase PRN  - trial of IBGuard PRN diarrhea   Dyspareunia in female Assessment & Plan: - pain with deep penetration - bilateral SI Joint pain with palpation and ilioinguinal nerve pain R > L - referred to pelvic floor PT due to pelvic floor myofascial pain - encouraged pelvic floor relaxation exercises and position change during intercourse for comfort - continue heating pad and Ibuprofen  use as needed for musculoskeletal pain   Feeling of incomplete bladder emptying Assessment & Plan: - PVR 12mL - repeat if clinical change - likely due to pelvic floor myofacial pain. Referral to pelvic floor PT   Abnormal urinalysis Assessment & Plan: - POCT UA + heme, pending UA microscopy and culture - reports history of rUTI symptoms and LUTS with intermittent negative urine cultures - discouraged antibiotic use in the absence of positive cultures due to risk of resistant uropathogen  Orders: -     Urine Microscopic; Future  Pelvic pain Assessment & Plan: - The origin of pelvic floor muscle spasm can be multifactorial, including primary, reactive to a different pain source, trauma, or even part of a centralized pain syndrome.Treatment options include pelvic floor physical therapy, local (vaginal) or oral  muscle relaxants, pelvic muscle trigger point injections or centrally acting pain medications.   - referral to pelvic floor PT - Rx Voltaren  gel up to 2g QID PRN pain - reviewed ilioinguinal and SI joint pain, reviewed comfort measures  and treatment options.  - consider topical lidocaine  1g PRN pain up to 3x/day or Amitriptyline 2.5%/ gabapentin 2.5%/ baclofen 2.5% in vaginal cream. Sent to compounding pharmacy for use daily - encouraged pelvic binder use for SI Joint pain and continue to sleep with pillow between her legs - avoid tight clothing over lower abdomen   Orders: -      Diclofenac  Sodium; Apply 2 g topically 4 (four) times daily.  Dispense: 50 g; Refill: 0  Smoker Assessment & Plan: - cutdown from 1.5ppd to 0.5ppd/day - encouraged to continue cessation due to association with stress urinary incontinence   Time spent: I spent 64 minutes dedicated to the care of this patient on the date of this encounter to include pre-visit review of records, face-to-face time with the patient discussing OAB, IBS-D, pelvic pain, dyspareunia, tobacco use, feeling of incomplete bladder emptying, and post visit documentation and ordering medication/ testing.   [name], MD

## 2024-08-02 ENCOUNTER — Emergency Department (HOSPITAL_COMMUNITY)
Admission: EM | Admit: 2024-08-02 | Discharge: 2024-08-02 | Disposition: A | Discharge status: Home / Self Care | Attending: Emergency Medicine | Admitting: Emergency Medicine

## 2024-08-02 ENCOUNTER — Encounter (HOSPITAL_COMMUNITY): Payer: Self-pay | Admitting: Emergency Medicine

## 2024-08-02 ENCOUNTER — Emergency Department (HOSPITAL_COMMUNITY)

## 2024-08-02 ENCOUNTER — Other Ambulatory Visit: Payer: Self-pay

## 2024-08-02 DIAGNOSIS — N83202 Unspecified ovarian cyst, left side: Secondary | ICD-10-CM | POA: Diagnosis not present

## 2024-08-02 DIAGNOSIS — R1030 Lower abdominal pain, unspecified: Secondary | ICD-10-CM

## 2024-08-02 DIAGNOSIS — R1031 Right lower quadrant pain: Secondary | ICD-10-CM | POA: Diagnosis present

## 2024-08-02 LAB — COMPREHENSIVE METABOLIC PANEL WITH GFR
ALT: 25 U/L (ref 0–44)
AST: 20 U/L (ref 15–41)
Albumin: 3.6 g/dL (ref 3.5–5.0)
Alkaline Phosphatase: 89 U/L (ref 38–126)
Anion gap: 11 (ref 5–15)
BUN: 8 mg/dL (ref 6–20)
CO2: 24 mmol/L (ref 22–32)
Calcium: 9 mg/dL (ref 8.9–10.3)
Chloride: 104 mmol/L (ref 98–111)
Creatinine, Ser: 0.71 mg/dL (ref 0.44–1.00)
GFR, Estimated: >60 mL/min (ref >60)
Glucose, Bld: 92 mg/dL (ref 70–99)
Potassium: 3.6 mmol/L (ref 3.5–5.1)
Sodium: 139 mmol/L (ref 135–145)
Total Bilirubin: 0.4 mg/dL (ref 0.0–1.2)
Total Protein: 6.5 g/dL (ref 6.5–8.1)

## 2024-08-02 LAB — CBC
HCT: 39.5 % (ref 36.0–46.0)
Hemoglobin: 13.1 g/dL (ref 12.0–15.0)
MCH: 30.3 pg (ref 26.0–34.0)
MCHC: 33.2 g/dL (ref 30.0–36.0)
MCV: 91.4 fL (ref 80.0–100.0)
Platelets: 204 K/uL (ref 150–400)
RBC: 4.32 MIL/uL (ref 3.87–5.11)
RDW: 12.9 % (ref 11.5–15.5)
WBC: 12.7 K/uL — ABNORMAL HIGH (ref 4.0–10.5)
nRBC: 0 % (ref 0.0–0.2)

## 2024-08-02 LAB — URINALYSIS, ROUTINE W REFLEX MICROSCOPIC
Bilirubin Urine: NEGATIVE
Glucose, UA: NEGATIVE mg/dL
Hgb urine dipstick: NEGATIVE
Ketones, ur: NEGATIVE mg/dL
Leukocytes,Ua: NEGATIVE
Nitrite: NEGATIVE
Protein, ur: NEGATIVE mg/dL
Specific Gravity, Urine: 1.004 — ABNORMAL LOW (ref 1.005–1.030)
pH: 6 (ref 5.0–8.0)

## 2024-08-02 LAB — LIPASE, BLOOD: Lipase: 39 U/L (ref 11–51)

## 2024-08-02 LAB — PREGNANCY, URINE: Preg Test, Ur: NEGATIVE

## 2024-08-02 MED ORDER — FENTANYL CITRATE (PF) 100 MCG/2ML IJ SOLN
50.0000 ug | Freq: Once | INTRAMUSCULAR | Status: DC
  Filled 2024-08-02: qty 2

## 2024-08-02 MED ORDER — MORPHINE SULFATE (PF) 4 MG/ML IV SOLN
4.0000 mg | Freq: Once | INTRAVENOUS | Status: AC
  Administered 2024-08-02: 4 mg via INTRAVENOUS
  Filled 2024-08-02: qty 1

## 2024-08-02 MED ORDER — IOHEXOL 300 MG/ML  SOLN
100.0000 mL | Freq: Once | INTRAMUSCULAR | Status: AC | PRN
  Administered 2024-08-02: 100 mL via INTRAVENOUS

## 2024-08-02 MED ORDER — HYDROCODONE-ACETAMINOPHEN 5-325 MG PO TABS: 1.0000 | ORAL_TABLET | Freq: Four times a day (QID) | ORAL | 0 refills | Status: DC | PRN

## 2024-08-02 MED ORDER — ONDANSETRON HCL 4 MG/2ML IJ SOLN
4.0000 mg | Freq: Once | INTRAMUSCULAR | Status: AC
  Administered 2024-08-02: 4 mg via INTRAVENOUS
  Filled 2024-08-02: qty 2

## 2024-08-02 MED ORDER — SODIUM CHLORIDE 0.9 % IV BOLUS
500.0000 mL | Freq: Once | INTRAVENOUS | Status: AC
  Administered 2024-08-02: 500 mL via INTRAVENOUS

## 2024-08-02 NOTE — Discharge Instructions (Signed)
 You were seen in the emergency department for lower abdominal pain.  Your lab work and urinalysis were unremarkable.  Your CAT scan showed an ovarian cyst on the left.  Please follow-up with your gynecologist and consider also scheduling appointment with GI for further workup of your symptoms.  Return to the emergency department if any high fevers or worsening symptoms

## 2024-08-02 NOTE — ED Provider Notes (Signed)
 [place] EMERGENCY DEPARTMENT AT [facility] Provider Note   CSN: [id number] Arrival date & time: 08/02/24  1729     Patient presents with: Abdominal Pain   Rebecca [name] is a 38 y.o. female.  She is here with 2 hours of severe cramping lower abdominal pain.  Radiates to her vagina and rectum.  No urinary symptoms.  Has not been constipated moving bowels normal.  No rectal or vaginal bleeding.  She is nauseous but she thinks it is due to pain.  No trauma.  She said she has had the symptoms before.  She recently saw a urogynecologist who told her she had tight walls and is sending her for physical therapy.  Last menstrual period was 10 days ago.  {Add pertinent medical, surgical, social history, OB history to YEP:67052} The history is provided by the patient.  Abdominal Pain Pain location:  Suprapubic, RLQ and LLQ Pain quality: aching and pressure   Pain radiates to:  Back Pain severity:  Severe Onset quality:  Sudden Duration:  2 hours Timing:  Constant Progression:  Waxing and waning Chronicity:  Recurrent Context: not trauma   Relieved by:  None tried Ineffective treatments:  None tried Associated symptoms: nausea   Associated symptoms: no chest pain, no constipation, no cough, no diarrhea, no dysuria, no fever, no hematemesis, no hematochezia, no hematuria, no vaginal bleeding, no vaginal discharge and no vomiting        Prior to Admission medications   Medication Sig Start Date End Date Taking? Authorizing Provider  ALPRAZolam  (XANAX ) 1 MG tablet Take 1 tablet (1 mg total) by mouth 2 (two) times daily as needed for anxiety. 07/19/24   Hoskins, Carolyn C, NP  diclofenac  Sodium (VOLTAREN ) 1 % GEL Apply 2 g topically 4 (four) times daily. 07/26/24   Guadlupe Lianne DASEN, MD  doxycycline  (VIBRAMYCIN ) 100 MG capsule Take 1 capsule (100 mg total) by mouth 2 (two) times daily. Prn infection 07/19/24   Mauro Elveria JAYSON, NP  Multiple Vitamin (MULTIVITAMIN) tablet Take 1 tablet  by mouth daily.    [provider]  omeprazole  (PRILOSEC) 20 MG capsule TAKE 1 CAPSULE BY MOUTH TWICE A DAY FOR PRN GERD 07/19/24   Mauro Elveria JAYSON, NP  sertraline  (ZOLOFT ) 50 MG tablet Take 1 tablet (50 mg total) by mouth daily. 07/19/24   Mauro Elveria JAYSON, NP  simvastatin  (ZOCOR ) 20 MG tablet TAKE 1 TABLET BY MOUTH DAILY 05/06/24   Signa Nest A, NP  Vibegron  (GEMTESA ) 75 MG TABS Take 1 tablet (75 mg total) by mouth daily. LOT: [id number] EXP: 12/2027 07/26/24   Guadlupe Lianne T, MD  Vibegron  (GEMTESA ) 75 MG TABS Take 1 tablet (75 mg total) by mouth daily. 07/26/24   Guadlupe Lianne DASEN, MD    Allergies: Amoxicillin, Cephalosporins, Septra [bactrim], Ciprocinonide [fluocinolone], Macrobid  [nitrofurantoin ], and Penicillins    Review of Systems  Constitutional:  Negative for fever.  Respiratory:  Negative for cough.   Cardiovascular:  Negative for chest pain.  Gastrointestinal:  Positive for abdominal pain and nausea. Negative for constipation, diarrhea, hematemesis, hematochezia and vomiting.  Genitourinary:  Negative for dysuria, hematuria, vaginal bleeding and vaginal discharge.    Updated Vital Signs BP 123/83 (BP Location: Right Arm)   Pulse 90   Temp 98.4 F (36.9 C) (Oral)   Resp 18   Ht 5' (1.524 m)   Wt 64 kg   LMP 07/21/2024 (Exact Date)   SpO2 100%   BMI 27.54 kg/m   Physical  Exam Vitals and nursing note reviewed.  Constitutional:      General: She is not in acute distress.    Appearance: Normal appearance. She is well-developed.  HENT:     Head: Normocephalic and atraumatic.  Eyes:     Conjunctiva/sclera: Conjunctivae normal.  Cardiovascular:     Rate and Rhythm: Normal rate and regular rhythm.     Heart sounds: No murmur heard. Pulmonary:     Effort: Pulmonary effort is normal. No respiratory distress.     Breath sounds: Normal breath sounds. No stridor. No wheezing.  Abdominal:     Palpations: Abdomen is soft.     Tenderness: There is no abdominal  tenderness. There is no guarding or rebound.     Hernia: No hernia is present.  Musculoskeletal:        General: No deformity.     Cervical back: Neck supple.  Skin:    General: Skin is warm and dry.  Neurological:     General: No focal deficit present.     Mental Status: She is alert.     GCS: GCS eye subscore is 4. GCS verbal subscore is 5. GCS motor subscore is 6.     (all labs ordered are listed, but only abnormal results are displayed) Labs Reviewed  URINALYSIS, ROUTINE W REFLEX MICROSCOPIC - Abnormal; Notable for the following components:      Result Value   Color, Urine STRAW (*)    Specific Gravity, Urine 1.004 (*)    All other components within normal limits  LIPASE, BLOOD  COMPREHENSIVE METABOLIC PANEL WITH GFR  CBC    EKG: None  Radiology: No results found.  {Document cardiac monitor, telemetry assessment procedure when appropriate:32947} Procedures   Medications Ordered in the ED  ondansetron  (ZOFRAN ) injection 4 mg (has no administration in time range)  sodium chloride  0.9 % bolus 500 mL (has no administration in time range)  fentaNYL  (SUBLIMAZE ) injection 50 mcg (has no administration in time range)      {Click here for ABCD2, HEART and other calculators REFRESH Note before signing:1}                              Medical Decision Making Amount and/or Complexity of Data Reviewed Labs: ordered.  Risk Prescription drug management.   This patient complains of ***; this involves an extensive number of treatment Options and is a complaint that carries with it a high risk of complications and morbidity. The differential includes ***  I ordered, reviewed and interpreted labs, which included *** I ordered medication *** and reviewed PMP when indicated. I ordered imaging studies which included *** and I independently    visualized and interpreted imaging which showed *** Additional history obtained from *** Previous records obtained and reviewed *** I  consulted *** and discussed lab and imaging findings and discussed disposition.  Cardiac monitoring reviewed, *** Social determinants considered, *** Critical Interventions: ***  After the interventions stated above, I reevaluated the patient and found *** Admission and further testing considered, ***   {Document critical care time when appropriate  Document review of labs and clinical decision tools ie CHADS2VASC2, etc  Document your independent review of radiology images and any outside records  Document your discussion with family members, caretakers and with consultants  Document social determinants of health affecting pt's care  Document your decision making why or why not admission, treatments were needed:32947:::1}   Final diagnoses:  None    ED Discharge Orders     None

## 2024-08-02 NOTE — ED Triage Notes (Signed)
 Pt to the ED with complaints of abdominal pain that began 2 hours ago.  Pt describes it as lower abdominal pain with vaginal and rectal pressure.  Pt has been evaluated in the past for the same with no cause found.

## 2024-08-03 ENCOUNTER — Telehealth: Payer: Self-pay | Admitting: Adult Health

## 2024-08-03 NOTE — Telephone Encounter (Signed)
 Pt states she is in need of an EDFU please advise.

## 2024-08-05 ENCOUNTER — Encounter: Payer: Self-pay | Admitting: Adult Health

## 2024-08-05 ENCOUNTER — Ambulatory Visit (INDEPENDENT_AMBULATORY_CARE_PROVIDER_SITE_OTHER): Admitting: Adult Health

## 2024-08-05 VITALS — BP 130/88 | HR 85 | Ht 60.0 in | Wt 141.0 lb

## 2024-08-05 DIAGNOSIS — N83202 Unspecified ovarian cyst, left side: Secondary | ICD-10-CM

## 2024-08-05 DIAGNOSIS — D72829 Elevated white blood cell count, unspecified: Secondary | ICD-10-CM

## 2024-08-05 DIAGNOSIS — R102 Pelvic and perineal pain unspecified side: Secondary | ICD-10-CM | POA: Insufficient documentation

## 2024-08-05 DIAGNOSIS — N200 Calculus of kidney: Secondary | ICD-10-CM

## 2024-08-05 NOTE — Progress Notes (Signed)
  Subjective:     Patient ID: Rebecca [name], female   DOB: [birth year], 38 y.o.   MRN: [id number]  HPI Rebecca [name] is a 38 year old white female, married, [id number], in for follow up after being seen in ER at [facility] 08/02/24 for abdominal pain. WBC was elevated.  She had CT:IMPRESSION: 1. Trace free fluid in the left adnexa may be physiologic. 2. 1.6 cm left ovarian simple-appearing cyst. No follow-up imaging recommended. 3. Colonic diverticulosis without evidence for diverticulitis. 4. Nonobstructing left renal calculus.      Component Value Date/Time   DIAGPAP  01/10/2023 1139    - Negative for Intraepithelial Lesions or Malignancy (NILM)   DIAGPAP - Benign reactive/reparative changes 01/10/2023 1139   DIAGPAP  06/06/2020 1051    - Negative for intraepithelial lesion or malignancy (NILM)   HPVHIGH Negative 01/10/2023 1139   HPVHIGH Negative 06/06/2020 1051   ADEQPAP  01/10/2023 1139    Satisfactory for evaluation; transformation zone component PRESENT.   ADEQPAP  06/06/2020 1051    Satisfactory for evaluation; transformation zone component PRESENT.   ADEQPAP  07/07/2017 0000    Satisfactory for evaluation  endocervical/transformation zone component PRESENT.    PCP is Dr Bluford  Review of Systems +pelvic pressure not, like something falling out Has OAB seen Dr Guadlupe  Reviewed past medical,surgical, social and family history. Reviewed medications and allergies.     Objective:   Physical Exam BP 130/88 (BP Location: Left Arm, Patient Position: Sitting, Cuff Size: Normal)   Pulse 85   Ht 5' (1.524 m)   Wt 141 lb (64 kg)   LMP 07/21/2024 (Exact Date)   BMI 27.54 kg/m     Skin warm and dry.Pelvic: external genitalia is normal in appearance no lesions, vagina: scant white discharge without odor,urethra has no lesions or masses noted, cervix is bulbous and everted at os, no CMT, uterus: normal size, shape and contour, non tender, no masses felt,(has pressure on palpation) adnexa: no masses  or tenderness noted. Bladder is non tender and no masses felt. No CVAT now  Upstream - 08/05/24 1023       Pregnancy Intention Screening   Does the patient want to become pregnant in the next year? No    Does the patient's partner want to become pregnant in the next year? No    Would the patient like to discuss contraceptive options today? No      Contraception Wrap Up   Current Method Vasectomy    End Method Vasectomy    Contraception Counseling Provided No         Examination chaperoned by Clarita Salt LPN  Assessment:     1. Pelvic pressure in female (Primary) +pressure now  2. Cyst of left ovary Has simple 1.6 cm left ovary cyst  3. Kidney stone on left side Has non obstructing left kidney stone   4. Leukocytosis, unspecified type WBC was elevated at 12.5 Will recheck CBC 9/8 - CBC w/Diff     Plan:     Follow up 08/10/24 for exam and ROS

## 2024-08-10 ENCOUNTER — Ambulatory Visit (INDEPENDENT_AMBULATORY_CARE_PROVIDER_SITE_OTHER): Admitting: Adult Health

## 2024-08-10 ENCOUNTER — Ambulatory Visit: Payer: Self-pay | Admitting: Adult Health

## 2024-08-10 ENCOUNTER — Encounter: Payer: Self-pay | Admitting: Adult Health

## 2024-08-10 VITALS — BP 119/79 | HR 81 | Ht 60.0 in | Wt 141.0 lb

## 2024-08-10 DIAGNOSIS — N200 Calculus of kidney: Secondary | ICD-10-CM

## 2024-08-10 DIAGNOSIS — N83202 Unspecified ovarian cyst, left side: Secondary | ICD-10-CM | POA: Diagnosis not present

## 2024-08-10 DIAGNOSIS — R102 Pelvic and perineal pain: Secondary | ICD-10-CM

## 2024-08-10 LAB — CBC WITH DIFFERENTIAL/PLATELET
Basophils Absolute: 0 x10E3/uL (ref 0.0–0.2)
Basos: 1 %
EOS (ABSOLUTE): 0.1 x10E3/uL (ref 0.0–0.4)
Eos: 2 %
Hematocrit: 40.1 % (ref 34.0–46.6)
Hemoglobin: 13.2 g/dL (ref 11.1–15.9)
Immature Grans (Abs): 0 x10E3/uL (ref 0.0–0.1)
Immature Granulocytes: 0 %
Lymphocytes Absolute: 2.5 x10E3/uL (ref 0.7–3.1)
Lymphs: 34 %
MCH: 30.3 pg (ref 26.6–33.0)
MCHC: 32.9 g/dL (ref 31.5–35.7)
MCV: 92 fL (ref 79–97)
Monocytes Absolute: 0.5 x10E3/uL (ref 0.1–0.9)
Monocytes: 7 %
Neutrophils Absolute: 4.1 x10E3/uL (ref 1.4–7.0)
Neutrophils: 56 %
Platelets: 172 x10E3/uL (ref 150–450)
RBC: 4.36 x10E6/uL (ref 3.77–5.28)
RDW: 12.5 % (ref 11.7–15.4)
WBC: 7.3 x10E3/uL (ref 3.4–10.8)

## 2024-08-10 NOTE — Progress Notes (Signed)
  Subjective:     Patient ID: Rebecca [name], female   DOB: [birth year], 38 y.o.   MRN: [id number]  HPI [name] is a 38 year old white female, married, [id number] back in follow up on having been seen in ER 08/02/24 for pelvic pain and pressure, had small left ovary cyst, diverticulosis and left kidney stone on CT, and WBC was elevated. She is feeling much better, still some pressure, now more to back and still has pain that goes and goes. WBC was back to normal when checked yesterday.     Component Value Date/Time   DIAGPAP  01/10/2023 1139    - Negative for Intraepithelial Lesions or Malignancy (NILM)   DIAGPAP - Benign reactive/reparative changes 01/10/2023 1139   DIAGPAP  06/06/2020 1051    - Negative for intraepithelial lesion or malignancy (NILM)   HPVHIGH Negative 01/10/2023 1139   HPVHIGH Negative 06/06/2020 1051   ADEQPAP  01/10/2023 1139    Satisfactory for evaluation; transformation zone component PRESENT.   ADEQPAP  06/06/2020 1051    Satisfactory for evaluation; transformation zone component PRESENT.   ADEQPAP  07/07/2017 0000    Satisfactory for evaluation  endocervical/transformation zone component PRESENT.   PCP is Dr Bluford  Review of Systems Feels better, still some pressure, more to back now and comes and goes Had sex the weekend and no pain Reviewed past medical,surgical, social and family history. Reviewed medications and allergies.     Objective:   Physical Exam BP 119/79 (BP Location: Left Arm, Patient Position: Sitting, Cuff Size: Normal)   Pulse 81   Ht 5' (1.524 m)   Wt 141 lb (64 kg)   LMP 07/21/2024 (Exact Date)   BMI 27.54 kg/m     Skin warm and dry.Pelvic: external genitalia is normal in appearance no lesions, vagina:pink,urethra has no lesions or masses noted, cervix is everted at os and  bulbous, uterus: normal size, shape and contour, non tender, no masses felt, adnexa: no masses or tenderness noted. Bladder is non tender and no masses felt.  Upstream -  08/10/24 0700       Contraception Wrap Up   Current Method Vasectomy    End Method Vasectomy    Contraception Counseling Provided No    How was the end contraceptive method provided? N/A         Examination chaperoned by Clarita Salt LPN  Assessment:     1. Pelvic pressure in female (Primary) Feels better, still some pressure, more to back now and comes and goes If pressure persists after next period let me know, will get US  to assess ovaries  2. Cyst of left ovary   3. Kidney stone on left side     Plan:     Follow up prn

## 2024-09-14 ENCOUNTER — Encounter: Payer: Self-pay | Admitting: Nurse Practitioner

## 2024-09-16 ENCOUNTER — Encounter: Payer: Self-pay | Admitting: Family Medicine

## 2024-09-16 ENCOUNTER — Ambulatory Visit (INDEPENDENT_AMBULATORY_CARE_PROVIDER_SITE_OTHER): Payer: Self-pay | Admitting: Family Medicine

## 2024-09-16 VITALS — BP 113/75 | HR 86 | Ht 60.0 in | Wt 144.4 lb

## 2024-09-16 DIAGNOSIS — L729 Follicular cyst of the skin and subcutaneous tissue, unspecified: Secondary | ICD-10-CM | POA: Diagnosis not present

## 2024-09-16 DIAGNOSIS — L089 Local infection of the skin and subcutaneous tissue, unspecified: Secondary | ICD-10-CM

## 2024-09-16 MED ORDER — DOXYCYCLINE HYCLATE 100 MG PO CAPS: 100.0000 mg | ORAL_CAPSULE | Freq: Two times a day (BID) | ORAL | 0 refills | Status: DC

## 2024-09-16 NOTE — Patient Instructions (Signed)
 Medication as prescribed.  Take care  Dr. Adriana Simas

## 2024-09-19 DIAGNOSIS — L089 Local infection of the skin and subcutaneous tissue, unspecified: Secondary | ICD-10-CM | POA: Insufficient documentation

## 2024-09-19 NOTE — Assessment & Plan Note (Signed)
 Got patient an appt with Dermatology. Treating with Doxycycline .

## 2024-09-19 NOTE — Progress Notes (Signed)
 Subjective:  Patient ID: Rebecca [name], female    DOB: [birth year]  Age: 38 y.o. MRN: [id number]  CC:  Lump   HPI:  38 year old female presents for evaluation of a lump on the right side of her face.  She states that this area has been there for a long time. Over the past few days it has gotten larger. It is red and tender. It is located on the right side of the face. No drainage from the area. No relieving factors. No fever. No other complaints.  Patient Active Problem List   Diagnosis Date Noted   Infected cyst of skin 09/19/2024   Kidney stone on left side 08/05/2024   Cyst of left ovary 08/05/2024   Abnormal uterine bleeding 07/19/2024   Gastroesophageal reflux disease without esophagitis 03/01/2024   Recurrent UTI 08/06/2023   Hidradenitis suppurativa 05/23/2023   Encounter for removal and reinsertion of Nexplanon  01/14/2023   Irritable bowel syndrome with both constipation and diarrhea 08/20/2021   OAB (overactive bladder) 04/25/2020   Dyspareunia in female 04/25/2020   Dyslipidemia (high LDL; low HDL) 09/17/2019   Smoker 05/14/2016   Depression with anxiety 04/05/2013    Social Hx   Social History   Socioeconomic History   Marital status: Married    Spouse name: Not on file   Number of children: 3   Years of education: Not on file   Highest education level: 9th grade  Occupational History   Not on file  Tobacco Use   Smoking status: Every Day    Current packs/day: 0.50    Average packs/day: 0.5 packs/day for 15.0 years (7.5 ttl pk-yrs)    Types: Cigarettes    Passive exposure: Current   Smokeless tobacco: Never  Vaping Use   Vaping status: Never Used  Substance and Sexual Activity   Alcohol use: No   Drug use: Never   Sexual activity: Yes    Birth control/protection: Surgical    Comment: Husband had a vasectomy  Other Topics Concern   Not on file  Social History Narrative   Not on file   Social Drivers of Health   Financial Resource Strain:  Low Risk  (03/01/2024)   Overall Financial Resource Strain (CARDIA)    Difficulty of Paying Living Expenses: Not hard at all  Food Insecurity: No Food Insecurity (03/01/2024)   Hunger Vital Sign    Worried About Running Out of Food in the Last Year: Never true    Ran Out of Food in the Last Year: Never true  Transportation Needs: No Transportation Needs (03/01/2024)   PRAPARE - Administrator, Civil Service (Medical): No    Lack of Transportation (Non-Medical): No  Physical Activity: Sufficiently Active (03/01/2024)   Exercise Vital Sign    Days of Exercise per Week: 5 days    Minutes of Exercise per Session: 60 min  Stress: No Stress Concern Present (03/01/2024)   [employer] of Occupational Health - Occupational Stress Questionnaire    Feeling of Stress : Not at all  Social Connections: Moderately Isolated (03/01/2024)   Social Connection and Isolation Panel    Frequency of Communication with Friends and Family: More than three times a week    Frequency of Social Gatherings with Friends and Family: Twice a week    Attends Religious Services: Never    Database administrator or Organizations: No    Attends Banker Meetings: Never    Marital Status: Married  Review of Systems Per HPI  Objective:  BP 113/75   Pulse 86   Ht 5' (1.524 m)   Wt 144 lb 6 oz (65.5 kg)   BMI 28.20 kg/m      09/16/2024   11:21 AM 08/10/2024    9:06 AM 08/05/2024   10:17 AM  BP/Weight  Systolic BP 113 119 130  Diastolic BP 75 79 88  Wt. (Lbs) 144.38 141 141  BMI 28.2 kg/m2 27.54 kg/m2 27.54 kg/m2    Physical Exam Vitals and nursing note reviewed.  Constitutional:      General: She is not in acute distress. HENT:     Head:      Comments: Firm, tender papule with mild erythema. Pulmonary:     Effort: Pulmonary effort is normal. No respiratory distress.  Neurological:     Mental Status: She is alert.     Lab Results  Component Value Date   WBC 7.3 08/09/2024    HGB 13.2 08/09/2024   HCT 40.1 08/09/2024   PLT 172 08/09/2024   GLUCOSE 92 08/02/2024   CHOL 178 07/19/2024   TRIG 143 07/19/2024   HDL 33 (L) 07/19/2024   LDLCALC 119 (H) 07/19/2024   ALT 25 08/02/2024   AST 20 08/02/2024   NA 139 08/02/2024   K 3.6 08/02/2024   CL 104 08/02/2024   CREATININE 0.71 08/02/2024   BUN 8 08/02/2024   CO2 24 08/02/2024   TSH 0.946 07/19/2024     Assessment & Plan:  Infected cyst of skin Assessment & Plan: Got patient an appt with Dermatology. Treating with Doxycycline .  Orders: -     Doxycycline  Hyclate; Take 1 capsule (100 mg total) by mouth 2 (two) times daily. Take with food.  Dispense: 14 capsule; Refill: 0    Follow-up:  Return if symptoms worsen or fail to improve.  Jacqulyn Ahle DO [facility]

## 2024-09-23 ENCOUNTER — Ambulatory Visit: Admitting: Dermatology

## 2024-10-05 ENCOUNTER — Ambulatory Visit: Payer: Self-pay

## 2024-10-05 NOTE — Telephone Encounter (Signed)
 1st attempt at calling patient---Left a generic voicemail for patient to call back          Reason for Triage: Uncontrolled diarrhea since Saturday. Today started with severe stomach cramps. No other symptoms. Patient CB# [phone]

## 2024-10-05 NOTE — Telephone Encounter (Signed)
 FYI Only or Action Required?: FYI only for provider: home care.  Patient was last seen in primary care on 09/16/2024 by Cook, Jayce G, DO.  Called Nurse Triage reporting Diarrhea.  Symptoms began several days ago.  Interventions attempted: Pepto, rest, hydration.  Symptoms are: gradually improving.  Triage Disposition: Home Care  Patient/caregiver understands and will follow disposition?: Yes  Reason for Triage: Uncontrolled diarrhea since Saturday. Today started with severe stomach cramps. No other symptoms. Patient CB# [phone]   Reason for Disposition  SEVERE diarrhea (e.g., 7 or more times / day more than normal)  Answer Assessment - Initial Assessment Questions Rebecca [name] on Saturday and took a couple bites and didn't taste right so stopped. Within a few hours she was having diarrhea up to 8-10x a day Sat-Mon. Able to still eat and drink ok. No nausea or vomiting. No signs of dehydration. Today she woke and her stomach is cramping. Describes it as feeling like stomach is spasming when you have thrown up a lot and have nothing in it.  Had period twice a month in the last 3 months, GERD meds, and Pepto (calmed diarrhea some). Pt advised home care CARE ADVICE. UC/ED/Callback given and understood. She will monitor for signs of UTI, worsening stomach cramping or radiating pain, or signs of dehydration. All questions answered  1. DIARRHEA SEVERITY: How bad is the diarrhea? How many more stools have you had in the past 24 hours than normal?      8-10/day -- this morning was normal  2. ONSET: When did the diarrhea begin?      Saturday 3. STOOL DESCRIPTION:  How loose or watery is the diarrhea? What is the stool color? Is there any blood or mucous in the stool?     Straight water 4. VOMITING: Are you also vomiting? If Yes, ask: How many times in the past 24 hours?      denies 5. ABDOMEN PAIN: Are you having any abdomen pain? If Yes, ask: What does it feel like?  (e.g., crampy, dull, intermittent, constant)      Stomach cramps  6. ABDOMEN PAIN SEVERITY: If present, ask: How bad is the pain?  (e.g., Scale 1-10; mild, moderate, or severe)     5/10 7. ORAL INTAKE: If vomiting, Have you been able to drink liquids? How much liquids have you had in the past 24 hours?     Drinking and eating fine 8. HYDRATION: Any signs of dehydration? (e.g., dry mouth [not just dry lips], too weak to stand, dizziness, new weight loss) When did you last urinate?     denies 9. EXPOSURE: Have you traveled to a foreign country recently? Have you been exposed to anyone with diarrhea? Could you have eaten any food that was spoiled?     Potential food poisoning  10. ANTIBIOTIC USE: Are you taking antibiotics now or have you taken antibiotics in the past 2 months?       Denies  11. OTHER SYMPTOMS: Do you have any other symptoms? (e.g., fever, blood in stool)       Denies  12. PREGNANCY: Is there any chance you are pregnant? When was your last menstrual period?       Period twice a month for last 3 months  Protocols used: Diarrhea-A-AH

## 2024-10-07 ENCOUNTER — Ambulatory Visit: Admitting: Nurse Practitioner

## 2024-10-07 ENCOUNTER — Encounter: Payer: Self-pay | Admitting: Nurse Practitioner

## 2024-10-07 VITALS — BP 122/86 | HR 66 | Temp 98.1°F | Ht 60.0 in | Wt 142.8 lb

## 2024-10-07 DIAGNOSIS — R197 Diarrhea, unspecified: Secondary | ICD-10-CM | POA: Diagnosis not present

## 2024-10-07 DIAGNOSIS — N939 Abnormal uterine and vaginal bleeding, unspecified: Secondary | ICD-10-CM

## 2024-10-07 DIAGNOSIS — R1011 Right upper quadrant pain: Secondary | ICD-10-CM | POA: Diagnosis not present

## 2024-10-07 DIAGNOSIS — K219 Gastro-esophageal reflux disease without esophagitis: Secondary | ICD-10-CM

## 2024-10-07 NOTE — Progress Notes (Signed)
   Subjective:    Patient ID: Rebecca [name], female    DOB: Jul 18, 1986, 38 y.o.   MRN: [id number]  HPI Pt has had diarrhea since Saturday 11/1 up until yesterday 11/5. Still having loose bowel.   Cramps in stomach started 11/3 Monday.  Light headed ad blurry visioin since 11/3 on Monday as well  Menstrual is off states she has 2 periods a month   Review of Systems     Objective:   Physical Exam        Assessment & Plan:

## 2024-10-08 ENCOUNTER — Encounter: Payer: Self-pay | Admitting: Nurse Practitioner

## 2024-10-08 ENCOUNTER — Ambulatory Visit: Payer: Self-pay | Admitting: Nurse Practitioner

## 2024-10-08 LAB — CBC WITH DIFFERENTIAL/PLATELET
Basophils Absolute: 0 x10E3/uL (ref 0.0–0.2)
Basos: 1 %
EOS (ABSOLUTE): 0.1 x10E3/uL (ref 0.0–0.4)
Eos: 2 %
Hematocrit: 41.5 % (ref 34.0–46.6)
Hemoglobin: 13.9 g/dL (ref 11.1–15.9)
Immature Grans (Abs): 0 x10E3/uL (ref 0.0–0.1)
Immature Granulocytes: 0 %
Lymphocytes Absolute: 3.6 x10E3/uL — ABNORMAL HIGH (ref 0.7–3.1)
Lymphs: 45 %
MCH: 30.4 pg (ref 26.6–33.0)
MCHC: 33.5 g/dL (ref 31.5–35.7)
MCV: 91 fL (ref 79–97)
Monocytes Absolute: 0.5 x10E3/uL (ref 0.1–0.9)
Monocytes: 6 %
Neutrophils Absolute: 3.8 x10E3/uL (ref 1.4–7.0)
Neutrophils: 46 %
Platelets: 209 x10E3/uL (ref 150–450)
RBC: 4.57 x10E6/uL (ref 3.77–5.28)
RDW: 12.1 % (ref 11.7–15.4)
WBC: 8.1 x10E3/uL (ref 3.4–10.8)

## 2024-10-08 LAB — HEPATIC FUNCTION PANEL
ALT: 21 IU/L (ref 0–32)
AST: 18 IU/L (ref 0–40)
Albumin: 4.3 g/dL (ref 3.9–4.9)
Alkaline Phosphatase: 110 IU/L (ref 41–116)
Bilirubin Total: 0.2 mg/dL (ref 0.0–1.2)
Bilirubin, Direct: <0.08 mg/dL (ref 0.00–0.40)
Total Protein: 6.8 g/dL (ref 6.0–8.5)

## 2024-10-08 LAB — HCG, SERUM, QUALITATIVE: hCG,Beta Subunit,Qual,Serum: NEGATIVE m[IU]/mL (ref <6)

## 2024-10-08 LAB — LIPASE: Lipase: 41 U/L (ref 14–72)

## 2024-10-08 MED ORDER — SUCRALFATE 1 G PO TABS: 1.0000 g | ORAL_TABLET | Freq: Three times a day (TID) | ORAL | 0 refills | Status: AC

## 2024-10-11 NOTE — Telephone Encounter (Signed)
 Cook, Jayce G, DO      10/10/24  8:21 PM He already sees GI.SABRASABRA ?

## 2024-10-12 ENCOUNTER — Encounter (INDEPENDENT_AMBULATORY_CARE_PROVIDER_SITE_OTHER): Payer: Self-pay | Admitting: *Deleted

## 2024-10-14 ENCOUNTER — Emergency Department (HOSPITAL_COMMUNITY)
Admission: EM | Admit: 2024-10-14 | Discharge: 2024-10-14 | Disposition: A | Discharge status: Home / Self Care | Attending: Emergency Medicine | Admitting: Emergency Medicine

## 2024-10-14 ENCOUNTER — Emergency Department (HOSPITAL_COMMUNITY)

## 2024-10-14 ENCOUNTER — Encounter (HOSPITAL_COMMUNITY): Payer: Self-pay | Admitting: Emergency Medicine

## 2024-10-14 ENCOUNTER — Encounter: Payer: Self-pay | Admitting: Nurse Practitioner

## 2024-10-14 DIAGNOSIS — D72829 Elevated white blood cell count, unspecified: Secondary | ICD-10-CM | POA: Diagnosis not present

## 2024-10-14 DIAGNOSIS — R1032 Left lower quadrant pain: Secondary | ICD-10-CM | POA: Insufficient documentation

## 2024-10-14 DIAGNOSIS — R1030 Lower abdominal pain, unspecified: Secondary | ICD-10-CM | POA: Diagnosis present

## 2024-10-14 DIAGNOSIS — R102 Pelvic and perineal pain unspecified side: Secondary | ICD-10-CM

## 2024-10-14 DIAGNOSIS — R1084 Generalized abdominal pain: Secondary | ICD-10-CM | POA: Insufficient documentation

## 2024-10-14 LAB — COMPREHENSIVE METABOLIC PANEL WITH GFR
ALT: 25 U/L (ref 0–44)
AST: 21 U/L (ref 15–41)
Albumin: 4.5 g/dL (ref 3.5–5.0)
Alkaline Phosphatase: 110 U/L (ref 38–126)
Anion gap: 10 (ref 5–15)
BUN: 7 mg/dL (ref 6–20)
CO2: 26 mmol/L (ref 22–32)
Calcium: 9.3 mg/dL (ref 8.9–10.3)
Chloride: 105 mmol/L (ref 98–111)
Creatinine, Ser: 0.77 mg/dL (ref 0.44–1.00)
GFR, Estimated: >60 mL/min (ref >60)
Glucose, Bld: 98 mg/dL (ref 70–99)
Potassium: 4 mmol/L (ref 3.5–5.1)
Sodium: 141 mmol/L (ref 135–145)
Total Bilirubin: <0.2 mg/dL (ref 0.0–1.2)
Total Protein: 7.2 g/dL (ref 6.5–8.1)

## 2024-10-14 LAB — URINALYSIS, ROUTINE W REFLEX MICROSCOPIC
Bilirubin Urine: NEGATIVE
Glucose, UA: NEGATIVE mg/dL
Hgb urine dipstick: NEGATIVE
Ketones, ur: NEGATIVE mg/dL
Leukocytes,Ua: NEGATIVE
Nitrite: NEGATIVE
Protein, ur: NEGATIVE mg/dL
Specific Gravity, Urine: 1.011 (ref 1.005–1.030)
pH: 6 (ref 5.0–8.0)

## 2024-10-14 LAB — CBC
HCT: 43.9 % (ref 36.0–46.0)
Hemoglobin: 14.4 g/dL (ref 12.0–15.0)
MCH: 30.2 pg (ref 26.0–34.0)
MCHC: 32.8 g/dL (ref 30.0–36.0)
MCV: 92 fL (ref 80.0–100.0)
Platelets: 218 K/uL (ref 150–400)
RBC: 4.77 MIL/uL (ref 3.87–5.11)
RDW: 12.7 % (ref 11.5–15.5)
WBC: 14.4 K/uL — ABNORMAL HIGH (ref 4.0–10.5)
nRBC: 0 % (ref 0.0–0.2)

## 2024-10-14 LAB — POC URINE PREG, ED: Preg Test, Ur: NEGATIVE

## 2024-10-14 LAB — LIPASE, BLOOD: Lipase: 37 U/L (ref 11–51)

## 2024-10-14 MED ORDER — IOHEXOL 300 MG/ML  SOLN
100.0000 mL | Freq: Once | INTRAMUSCULAR | Status: AC | PRN
  Administered 2024-10-14: 100 mL via INTRAVENOUS

## 2024-10-14 MED ORDER — ONDANSETRON HCL 4 MG/2ML IJ SOLN
4.0000 mg | Freq: Once | INTRAMUSCULAR | Status: AC
  Administered 2024-10-14: 4 mg via INTRAVENOUS
  Filled 2024-10-14: qty 2

## 2024-10-14 MED ORDER — HYDROMORPHONE HCL 1 MG/ML IJ SOLN
1.0000 mg | Freq: Once | INTRAMUSCULAR | Status: AC
  Administered 2024-10-14: 1 mg via INTRAVENOUS
  Filled 2024-10-14: qty 1

## 2024-10-14 NOTE — ED Triage Notes (Signed)
 Pt complains of lower abd pain and lower back pain started suddenly at 11am. Nausea no vomiting. Last BM this morning was normal

## 2024-10-14 NOTE — Discharge Instructions (Signed)
 Thank you for visiting the Emergency Department today. It was a pleasure to be part of your healthcare team.  Your labs and imaging showed no acute findings. At home, rest, hydrate, and resume normal diet.  As we discussed, it is important to watch for warning signs such as worsening pain or fever. If any of these happen, return to the Emergency Department or call 911. Thank you for trusting us  with your health.

## 2024-10-14 NOTE — ED Provider Notes (Signed)
 Rebecca [name] EMERGENCY DEPARTMENT AT [facility] Provider Note   CSN: [id number] Arrival date & time: 10/14/24  1353     Patient presents with: Abdominal Pain and Back Pain   Rebecca [name] is a 38 y.o. female with a history of recurrent ovarian cysts, who presents to the ED with lower abdominal pain x 4 hours.  Patient stated that the abdominal pain came on suddenly, was extreme, and localized to the lower left side. At this time, she states that she is still experiencing pain however it has slightly subsided. Patient states that she was seen in the ED a couple of months ago for the same and found that she had a left ovarian cyst. Patient denies fevers, nausea, vomiting, diarrhea.  Patient denies any urinary symptoms.  Patient denies any vaginal bleeding or discharge.  Patient denies trauma.  No recent travel, no sick contacts.    Abdominal Pain Back Pain Associated symptoms: abdominal pain        Prior to Admission medications   Medication Sig Start Date End Date Taking? Authorizing Provider  Acetaminophen  (TYLENOL  PO) Take by mouth.    [provider]  ALPRAZolam  (XANAX ) 1 MG tablet Take 1 tablet (1 mg total) by mouth 2 (two) times daily as needed for anxiety. 10/21/24   Mauro Elveria JAYSON, NP  doxycycline  (VIBRA -TABS) 100 MG tablet Take 1 tablet (100 mg total) by mouth 2 (two) times daily. 10/18/24   Signa Delon LABOR, NP  ibuprofen  (ADVIL ) 200 MG tablet Take 400 mg by mouth every 6 (six) hours as needed.    [provider]  Multiple Vitamin (MULTIVITAMIN) tablet Take 1 tablet by mouth daily.    [provider]  omeprazole  (PRILOSEC) 20 MG capsule TAKE 1 CAPSULE BY MOUTH TWICE A DAY FOR PRN GERD 07/19/24   Hoskins, Carolyn C, NP  ondansetron  (ZOFRAN ) 4 MG tablet Take 1 tablet (4 mg total) by mouth every 8 (eight) hours as needed. 10/18/24   Signa Delon LABOR, NP  sertraline  (ZOLOFT ) 50 MG tablet Take 1 tablet (50 mg total) by mouth daily.  10/21/24   Mauro Elveria JAYSON, NP  simvastatin  (ZOCOR ) 20 MG tablet TAKE 1 TABLET BY MOUTH DAILY 05/06/24   Signa Delon A, NP  sucralfate  (CARAFATE ) 1 g tablet Take 1 tablet (1 g total) by mouth 4 (four) times daily -  with meals and at bedtime. Prn acid reflux 10/08/24   Mauro Elveria JAYSON, NP    Allergies: Amoxicillin, Metronidazole , Cephalosporins, Septra [bactrim], Ciprocinonide [fluocinolone], Macrobid  [nitrofurantoin ], and Penicillins    Review of Systems  Gastrointestinal:  Positive for abdominal pain.    Updated Vital Signs BP 123/78   Pulse 61   Temp (!) 97.5 F (36.4 C)   Resp 20   LMP 10/08/2024 (Exact Date)   SpO2 100%   Physical Exam Vitals and nursing note reviewed.  Constitutional:      General: She is not in acute distress.    Appearance: Normal appearance.  HENT:     Head: Normocephalic and atraumatic.  Eyes:     Extraocular Movements: Extraocular movements intact.     Conjunctiva/sclera: Conjunctivae normal.     Pupils: Pupils are equal, round, and reactive to light.  Cardiovascular:     Rate and Rhythm: Normal rate and regular rhythm.     Pulses: Normal pulses.  Pulmonary:     Effort: Pulmonary effort is normal. No respiratory distress.  Abdominal:     General: Abdomen is flat.  Palpations: Abdomen is soft.     Tenderness: There is abdominal tenderness in the left lower quadrant.     Comments: Abdominal tenderness on palpation to the lower left quadrant.  No CVA tenderness.  No guarding, rebound, or distention.  No masses appreciated on exam.  Musculoskeletal:        General: Normal range of motion.     Cervical back: Normal range of motion.  Skin:    General: Skin is warm and dry.     Capillary Refill: Capillary refill takes less than 2 seconds.  Neurological:     General: No focal deficit present.     Mental Status: She is alert. Mental status is at baseline.  Psychiatric:        Mood and Affect: Mood normal.     (all labs ordered are  listed, but only abnormal results are displayed) Labs Reviewed  CBC - Abnormal; Notable for the following components:      Result Value   WBC 14.4 (*)    All other components within normal limits  LIPASE, BLOOD  COMPREHENSIVE METABOLIC PANEL WITH GFR  URINALYSIS, ROUTINE W REFLEX MICROSCOPIC  POC URINE PREG, ED    EKG: None  Radiology: No results found.   Procedures   Medications Ordered in the ED  ondansetron  (ZOFRAN ) injection 4 mg (4 mg Intravenous Given 10/14/24 1753)  HYDROmorphone  (DILAUDID ) injection 1 mg (1 mg Intravenous Given 10/14/24 1753)  iohexol  (OMNIPAQUE ) 300 MG/ML solution 100 mL (100 mLs Intravenous Contrast Given 10/14/24 1839)    Clinical Course as of 10/29/24 0759  Thu Oct 14, 2024  1917 US  PELVIC DOPPLER (TORSION R/O OR MASS ARTERIAL FLOW) [ML]    Clinical Course User Index [ML] Rebecca Duwaine CROME, PA                                 Medical Decision Making Amount and/or Complexity of Data Reviewed Labs: ordered. Radiology: ordered. Decision-making details documented in ED Course.  Risk Prescription drug management.   Patient presents to the ED for concern of lower left abdominal pain x 4 hours, this involves an extensive number of treatment options, and is a complaint that carries with it a high risk of complications and morbidity.    The differential diagnosis includes: Ovarian torsion Urolithiasis  Colitis  Other infectious etiology   Additional history obtained: Additional history obtained from Outside Medical Records  External records from outside source obtained and reviewed including OBGYN records for previous ovarian cyst visits/treatment  Lab Tests: I ordered, and personally interpreted labs.   The pertinent results include:  WBC: 14.1  Imaging Studies ordered: I ordered imaging studies including: US  pelvic/w doppler  CT abdomen w contrast  I independently visualized and interpreted imaging which showed: Colonic  diverticulosis  No other acute findings  I agree with the radiologist interpretation  Medicines ordered and prescription drug management: I ordered medications: Zofran  4 mg  for nausea  Hydromorphone  1 mg for pain Reevaluation of the patient after these medicines showed that the patient improved I have reviewed the patients home medicines and have made adjustments as needed  Problem List / ED Course: Problem List: Abdominal pain Emergency Department Course: The patient presented with lower left sided abdominal pain. Initial assessment included history, physical exam, and review of prior medical records. Laboratory evaluation obtained, which was overall reassuring with the exception of mild leukocytosis of 14.4.  CT abdomen/pelvis with  contrast demonstrated diverticulosis without evidence of acute diverticulitis, perforation, abscess, or other emergent pathology.  Patient was treated symptomatically in the ED with pain management and antiemetics, which were tolerated with no adverse effects and resulted in symptomatic improvement.  Given stable vital signs, reassuring abdominal exam, benign imaging, and improvement with supportive care, patient is appropriate for discharge.  Strict return precautions reviewed, including worsening abdominal pain, fever, vomiting, or inability to tolerate PO, or any new concerning symptoms.  Encouraged supportive measures at home and close follow-up with primary care provider.  Reevaluation: After the interventions noted above, I reevaluated the patient and found that they have :improved  Dispostion: After consideration of the diagnostic results and the patients response to treatment, I feel that the patent would benefit from discharge home with supportive care measures and close follow up with PCP for further evaluation and care.       Final diagnoses:  Generalized abdominal pain    ED Discharge Orders     None          Rebecca [name],  [place] 10/29/24 9180    Rebecca Elsie CROME, MD 10/30/24 1341

## 2024-10-18 ENCOUNTER — Encounter: Payer: Self-pay | Admitting: Adult Health

## 2024-10-18 ENCOUNTER — Other Ambulatory Visit (HOSPITAL_COMMUNITY)
Admission: RE | Admit: 2024-10-18 | Discharge: 2024-10-18 | Disposition: A | Discharge status: Home / Self Care | Source: Ambulatory Visit | Attending: Adult Health | Admitting: Adult Health

## 2024-10-18 ENCOUNTER — Ambulatory Visit: Admitting: Adult Health

## 2024-10-18 VITALS — BP 123/79 | HR 65 | Ht 60.0 in | Wt 143.0 lb

## 2024-10-18 DIAGNOSIS — Z113 Encounter for screening for infections with a predominantly sexual mode of transmission: Secondary | ICD-10-CM

## 2024-10-18 DIAGNOSIS — N941 Unspecified dyspareunia: Secondary | ICD-10-CM

## 2024-10-18 DIAGNOSIS — R102 Pelvic and perineal pain unspecified side: Secondary | ICD-10-CM | POA: Diagnosis not present

## 2024-10-18 DIAGNOSIS — N739 Female pelvic inflammatory disease, unspecified: Secondary | ICD-10-CM | POA: Diagnosis not present

## 2024-10-18 DIAGNOSIS — N730 Acute parametritis and pelvic cellulitis: Secondary | ICD-10-CM | POA: Insufficient documentation

## 2024-10-18 MED ORDER — CEFTRIAXONE SODIUM 1 G IJ SOLR
1.0000 g | Freq: Once | INTRAMUSCULAR | Status: AC
  Administered 2024-10-18: 1 g via INTRAMUSCULAR

## 2024-10-18 MED ORDER — ONDANSETRON HCL 4 MG PO TABS: 4.0000 mg | ORAL_TABLET | Freq: Three times a day (TID) | ORAL | 1 refills | Status: AC | PRN

## 2024-10-18 MED ORDER — METRONIDAZOLE 500 MG PO TABS: 500.0000 mg | ORAL_TABLET | Freq: Two times a day (BID) | ORAL | 0 refills | Status: DC

## 2024-10-18 MED ORDER — DOXYCYCLINE HYCLATE 100 MG PO TABS: 100.0000 mg | ORAL_TABLET | Freq: Two times a day (BID) | ORAL | 0 refills | Status: DC

## 2024-10-18 NOTE — Progress Notes (Signed)
 Subjective:     Patient ID: Rebecca [name], female   DOB: 1986-08-13, 38 y.o.   MRN: [id number]  HPI Rebecca [name] is a 38 year old white female, married, [id number], in for follow up from ER , was seen 10/14/24, for pelvic pain and she says has pain with sex. WBC was 14.4, urine was negative, CMP was normal. US  showed small follicle on right ovary and CT showed tiny gallstones.     Component Value Date/Time   DIAGPAP  01/10/2023 1139    - Negative for Intraepithelial Lesions or Malignancy (NILM)   DIAGPAP - Benign reactive/reparative changes 01/10/2023 1139   DIAGPAP  06/06/2020 1051    - Negative for intraepithelial lesion or malignancy (NILM)   HPVHIGH Negative 01/10/2023 1139   HPVHIGH Negative 06/06/2020 1051   ADEQPAP  01/10/2023 1139    Satisfactory for evaluation; transformation zone component PRESENT.   ADEQPAP  06/06/2020 1051    Satisfactory for evaluation; transformation zone component PRESENT.   ADEQPAP  07/07/2017 0000    Satisfactory for evaluation  endocervical/transformation zone component PRESENT.    PCP is Dr Bluford  Review of Systems +pelvic pain since 10/14/24 +pain with sex Reviewed past medical,surgical, social and family history. Reviewed medications and allergies.     Objective:   Physical Exam BP 123/79 (BP Location: Right Arm, Patient Position: Sitting, Cuff Size: Normal)   Pulse 65   Ht 5' (1.524 m)   Wt 143 lb (64.9 kg)   LMP 09/26/2024 (Exact Date)   BMI 27.93 kg/m     Skin warm and dry.Pelvic: external genitalia is normal in appearance no lesions, vagina: pink,urethra has no lesions or masses noted, cervix: bulbous, has nabothian cyst at 11 0'clock, +CMT,uterus: normal size, shape and contour, + tender, no masses felt, adnexa: no masses or tenderness noted. Bladder is mildly tender and no masses felt. CV swab self collected.  Upstream - 10/18/24 1123       Pregnancy Intention Screening   Does the patient want to become pregnant in the next year? No     Does the patient's partner want to become pregnant in the next year? No    Would the patient like to discuss contraceptive options today? No      Contraception Wrap Up   Current Method Vasectomy    End Method Vasectomy    Contraception Counseling Provided No         Examination chaperoned by Clarita Salt LPN  Assessment:     1. Pelvic pain +pain since 10/14/24, seen in ER Has taken ibuprofen   2. PID (acute pelvic inflammatory disease) (Primary) +CMT and uterine tenderness Will give rocephin 1 gm IM  in office and rx doxycycline  100 mg 1 bid x 14 days and metronidazole  500 mg 1 bid x 14 days and will rx zofran  to take before meds  She waited if office 15 minutes after Rocephin injection   Meds ordered this encounter  Medications   doxycycline  (VIBRA -TABS) 100 MG tablet    Sig: Take 1 tablet (100 mg total) by mouth 2 (two) times daily.    Dispense:  28 tablet    Refill:  0    Supervising Provider:   JAYNE MINDER H [[id number]]   metroNIDAZOLE  (FLAGYL ) 500 MG tablet    Sig: Take 1 tablet (500 mg total) by mouth 2 (two) times daily.    Dispense:  28 tablet    Refill:  0    Supervising Provider:   JAYNE MINDER H [[id number]]  ondansetron  (ZOFRAN ) 4 MG tablet    Sig: Take 1 tablet (4 mg total) by mouth every 8 (eight) hours as needed.    Dispense:  30 tablet    Refill:  1    Supervising Provider:   JAYNE, LUTHER H [[id number]]     3. Dyspareunia in female No sex for now  4. Screening examination for STD (sexually transmitted disease) CV swab sent for GC/CHL    Plan:     Follow up with me 11/02/24 for exam and ROS

## 2024-10-18 NOTE — Addendum Note (Signed)
 Addended by: NEYSA CLARITA RAMAN on: 10/18/2024 12:32 PM   Modules accepted: Orders

## 2024-10-19 ENCOUNTER — Ambulatory Visit: Payer: Self-pay | Admitting: Adult Health

## 2024-10-19 LAB — CERVICOVAGINAL ANCILLARY ONLY
Chlamydia: NEGATIVE
Comment: NEGATIVE
Comment: NORMAL
Neisseria Gonorrhea: NEGATIVE

## 2024-10-21 ENCOUNTER — Ambulatory Visit: Admitting: Nurse Practitioner

## 2024-10-21 ENCOUNTER — Encounter: Payer: Self-pay | Admitting: Nurse Practitioner

## 2024-10-21 VITALS — BP 117/80 | HR 86 | Ht 60.0 in | Wt 143.2 lb

## 2024-10-21 DIAGNOSIS — F17200 Nicotine dependence, unspecified, uncomplicated: Secondary | ICD-10-CM

## 2024-10-21 DIAGNOSIS — K219 Gastro-esophageal reflux disease without esophagitis: Secondary | ICD-10-CM

## 2024-10-21 DIAGNOSIS — F418 Other specified anxiety disorders: Secondary | ICD-10-CM | POA: Diagnosis not present

## 2024-10-21 MED ORDER — ALPRAZOLAM 1 MG PO TABS: 1.0000 mg | ORAL_TABLET | Freq: Two times a day (BID) | ORAL | 5 refills | Status: AC | PRN

## 2024-10-21 MED ORDER — SERTRALINE HCL 50 MG PO TABS: 50.0000 mg | ORAL_TABLET | Freq: Every day | ORAL | 1 refills | Status: AC

## 2024-10-21 NOTE — Progress Notes (Signed)
 Subjective:    /Patient ID: Rebecca [name], female    DOB: Apr 12, 1986, 38 y.o.   MRN: [id number]  HPI Discussed the use of AI scribe software for clinical note transcription with the patient, who gave verbal consent to proceed.  History of Present Illness Rebecca [name] is a 38 year old female who presents with anxiety and medication management concerns. She is accompanied by her husband, Merilee.  She is currently taking sertraline  inconsistently and has some remaining from a previous prescription. She takes Xanax  1/2-1 mg twice daily for anxiety and panic attacks. She experiences panic attacks and describes feeling 'really tight' in the mid upper chest. This does not occur with activity. She has been on Xanax  since age 66, initially prescribed by Dr. Zebedee Schimke, and restarted by her current provider. She did not experience withdrawal symptoms when she stopped during pregnancy.  She is taking doxycycline  for pelvic inflammatory disease (PID). Had a severe allergic reaction to Metronidazole . The chart has been marked appropriately. Symptoms improving. Has follow up appointment with GYN on 11/02/24.   She has a history of reflux, well-controlled with medication taken twice daily. Missing doses leads to symptoms, indicating the importance of consistent medication use. Has an appointment with GI specialist on 11/09/24.  No current respiratory symptoms such as chest pain, shortness of breath, coughing, or wheezing.   She is a mother and manages a lot on her own, indicating a high level of responsibility and stress. She is also a smoker.     Review of Systems  Constitutional:  Positive for fatigue. Negative for fever.  Respiratory:  Positive for chest tightness. Negative for cough, shortness of breath and wheezing.   Cardiovascular:  Negative for chest pain.  Psychiatric/Behavioral:  Positive for dysphoric mood and sleep disturbance. Negative for self-injury and suicidal  ideas. The patient is nervous/anxious.       10/21/2024   10:49 AM  Depression screen PHQ 2/9  Decreased Interest 0  Down, Depressed, Hopeless 0  PHQ - 2 Score 0  Altered sleeping 0  Tired, decreased energy 0  Change in appetite 0  Feeling bad or failure about yourself  0  Trouble concentrating 0  Moving slowly or fidgety/restless 0  Suicidal thoughts 0  PHQ-9 Score 0  Difficult doing work/chores Not difficult at all      10/21/2024   10:49 AM 10/07/2024   10:45 AM 09/16/2024   11:22 AM 07/19/2024   10:13 AM  GAD 7 : Generalized Anxiety Score  Nervous, Anxious, on Edge 0 0 0 0  Control/stop worrying 0 0 0 0  Worry too much - different things 0 0 0 0  Trouble relaxing 0 0 0 0  Restless 0 0 0 0  Easily annoyed or irritable 0 0 0 0  Afraid - awful might happen 0 0 0 0  Total GAD 7 Score 0 0 0 0  Anxiety Difficulty Not difficult at all   Not difficult at all   Social History   Tobacco Use   Smoking status: Every Day    Current packs/day: 0.50    Average packs/day: 0.5 packs/day for 15.0 years (7.5 ttl pk-yrs)    Types: Cigarettes    Passive exposure: Current   Smokeless tobacco: Never  Vaping Use   Vaping status: Never Used  Substance Use Topics   Alcohol use: No   Drug use: Never        Objective:   Physical Exam Vitals and nursing  note reviewed.  Constitutional:      General: She is not in acute distress. Cardiovascular:     Rate and Rhythm: Normal rate and regular rhythm.     Heart sounds: Normal heart sounds.  Pulmonary:     Effort: Pulmonary effort is normal.     Breath sounds: Normal breath sounds.  Abdominal:     Comments: No epigastric pain with palpation.   Neurological:     Mental Status: She is alert and oriented to person, place, and time.  Psychiatric:        Attention and Perception: Attention and perception normal.        Mood and Affect: Mood and affect normal.        Speech: Speech normal.        Behavior: Behavior normal. Behavior  is cooperative.        Thought Content: Thought content normal.        Judgment: Judgment normal.     Comments: Making good eye contact.     Today's Vitals   10/21/24 1040  BP: 117/80  Pulse: 86  SpO2: 96%  Weight: 143 lb 4 oz (65 kg)  Height: 5' (1.524 m)   Body mass index is 27.98 kg/m.        Assessment & Plan:  1. Depression with anxiety (Primary) Taking sertraline  and Xanax . Sertraline  recommended daily for serotonin stabilization. Xanax  use frequent; emphasized need for daily medication. Considered alternatives if sertraline  not tolerated. - Prescribed sertraline  50 mg daily, potential increase in 4-6 weeks if needed. - Continue Xanax  as needed, emphasize need for daily SSRI medication. - Consider alternative medications if sertraline  not tolerated or improving her symptoms. - Consider counseling.  - ALPRAZolam  (XANAX ) 1 MG tablet; Take 1 tablet (1 mg total) by mouth 2 (two) times daily as needed for anxiety.  Dispense: 60 tablet; Refill: 5 - sertraline  (ZOLOFT ) 50 MG tablet; Take 1 tablet (50 mg total) by mouth daily.  Dispense: 90 tablet; Refill: 1  2. Smoker Discussed smoking impact on health. Prioritize mental health before smoking cessation. Emphasized healthy lifestyle and stress reduction. - Focus on mental health management before addressing smoking cessation. - Encouraged healthy lifestyle changes including stress reduction and self-care.  3. Gastroesophageal reflux disease without esophagitis Well-controlled with omeprazole . - Continue omeprazole  20 mg oral bid. - Follow up with GI as planned.   Return in about 6 months (around 04/20/2025).

## 2024-10-22 NOTE — Progress Notes (Deleted)
 [facility] Return Visit  SUBJECTIVE  History of Present Illness: Rebecca [name] is a 38 y.o. female seen in follow-up for OAB, IBS-D, pelvic pain, dyspareunia, tobacco use, and feeling of incomplete bladder emptying. Plan at last visit was ***pelvic floor PT referral, fluid management and caffeine reduction, Voltaren  gel, pelvic binder, tobacco cessation, IBGuard.   Tried oxybutynin  10mg  in 2021 with minimal relief and stopped due to dry mouth.  Leaks 2 time(s) per days with urgency Leaks 1x/week with coughing/sneezing, worse when she jumps on a trampoline  Pad use: 3-4 liners/ mini-pads per day.  Denies night time leakage Day time voids 15.  Nocturia: 1 times per night to void.  Drinks: 80oz water per day, 24-36oz pepsi, 8oz of coffee or juice  Bowel movements: 1-3 time(s) per day with water triggered by spicy foods or coffee with milk, ice cream, coffee, and stress   ED evaluation 08/02/24 and 10/14/24 for abdominal pain. Rx norco for non-obstructive left kidney stone  Negative Nuswab 10/18/24  CT abd/pelvis w/ contrast 10/14/24: CLINICAL DATA:  Abdominal pain.   EXAM: CT ABDOMEN AND PELVIS WITH CONTRAST   TECHNIQUE: Multidetector CT imaging of the abdomen and pelvis was performed using the standard protocol following bolus administration of intravenous contrast.   RADIATION DOSE REDUCTION: This exam was performed according to the departmental dose-optimization program which includes automated exposure control, adjustment of the mA and/or kV according to patient size and/or use of iterative reconstruction technique.   CONTRAST:  OMNIPAQUE  IOHEXOL  300 MG/ML  SOLN   COMPARISON:  CT abdomen pelvis dated 08/02/2024.   FINDINGS: Lower chest: The visualized lung bases are clear.   No intra-abdominal free air or free fluid.   Hepatobiliary: The liver is unremarkable. No biliary dilatation. Tiny gallstone. No pericholecystic fluid or evidence of  acute cholecystitis by CT.   Pancreas: Unremarkable. No pancreatic ductal dilatation or surrounding inflammatory changes.   Spleen: Normal in size without focal abnormality.   Adrenals/Urinary Tract: The adrenal glands are unremarkable. There is a focal area of cortical scarring with associated punctate calcification involving the upper pole of the left kidney. There is no hydronephrosis on either side. The visualized ureters and urinary bladder appear unremarkable.   Stomach/Bowel: There is mild sigmoid diverticulosis and scattered colonic diverticula. There is no bowel obstruction or active inflammation. The appendix is normal.   Vascular/Lymphatic: Mild aortoiliac atherosclerotic disease. The IVC is unremarkable. No portal venous gas. There is no adenopathy.   Reproductive: The uterus is anteverted. No suspicious adnexal masses. A 15 mm left ovarian dominant follicle or corpus luteum.   Other: None   Musculoskeletal: No acute or significant osseous findings.   IMPRESSION: 1. No acute intra-abdominal or pelvic pathology. 2. Colonic diverticulosis. No bowel obstruction. Normal appendix. 3. Cholelithiasis. 4.  Aortic Atherosclerosis (ICD10-I70.0).     Electronically Signed   By: Vanetta Chou M.D.   On: 10/14/2024 18:53  TVUS 10/14/24 CLINICAL DATA:  Lower abdominal pain.   EXAM: TRANSABDOMINAL ULTRASOUND OF PELVIS   DOPPLER ULTRASOUND OF OVARIES   TECHNIQUE: Transabdominal ultrasound examination of the pelvis was performed including evaluation of the uterus, ovaries, adnexal regions, and pelvic cul-de-sac.   Color and duplex Doppler ultrasound was utilized to evaluate blood flow to the ovaries.   COMPARISON:  CT dated 08/02/2024.   FINDINGS: Uterus   Measurements: 7.7 x 3.1 x 5.7 cm = volume: 70 mL. No fibroids or other mass visualized.   Endometrium   Thickness: 9 mm.  No focal abnormality visualized.   Right ovary   Measurements: 3.5 x 1.8  x 2.6 cm = volume: 8.8 mL. There is a 2 cm dominant follicle or corpus luteum in the right ovary.   Left ovary   Measurements: 3.0 x 2.4 x 2.5 cm = volume: 8.9 mL. Normal appearance/no adnexal mass.   Pulsed Doppler evaluation demonstrates normal low-resistance arterial and venous waveforms in both ovaries.   Other: None   IMPRESSION: Unremarkable pelvic ultrasound.     Electronically Signed   By: Vanetta Chou M.D.   On: 10/14/2024 18:24  Past Medical History: Patient  has a past medical history of Anxiety, Back pain, GERD (gastroesophageal reflux disease), Hyperlipidemia, and Smoker (05/14/2016).   Past Surgical History: She  has a past surgical history that includes No past surgeries.   Medications: She has a current medication list which includes the following prescription(s): acetaminophen , alprazolam , doxycycline , ibuprofen , multivitamin, omeprazole , ondansetron , sertraline , simvastatin , and sucralfate .   Allergies: Patient is allergic to amoxicillin, metronidazole , cephalosporins, septra [bactrim], ciprocinonide [fluocinolone], macrobid  [nitrofurantoin ], and penicillins.   Social History: Patient  reports that she has been smoking cigarettes. She has a 7.5 pack-year smoking history. She has been exposed to tobacco smoke. She has never used smokeless tobacco. She reports that she does not drink alcohol and does not use drugs.     OBJECTIVE     Physical Exam: There were no vitals filed for this visit. Gen: No apparent distress, A&O x 3.  Detailed Urogynecologic Evaluation:  Deferred. Prior exam showed:      No data to display             ASSESSMENT AND PLAN    Rebecca [name] is a 39 y.o. with:  No diagnosis found.  There are no diagnoses linked to this encounter.   Rebecca ONEIDA Gillis, MD

## 2024-10-25 ENCOUNTER — Ambulatory Visit: Admitting: Obstetrics

## 2024-11-02 ENCOUNTER — Encounter: Payer: Self-pay | Admitting: Family Medicine

## 2024-11-02 ENCOUNTER — Encounter: Payer: Self-pay | Admitting: Adult Health

## 2024-11-02 ENCOUNTER — Ambulatory Visit: Admitting: Adult Health

## 2024-11-02 VITALS — BP 126/77 | HR 79 | Ht 60.0 in | Wt 144.0 lb

## 2024-11-02 DIAGNOSIS — N739 Female pelvic inflammatory disease, unspecified: Secondary | ICD-10-CM

## 2024-11-02 DIAGNOSIS — N730 Acute parametritis and pelvic cellulitis: Secondary | ICD-10-CM

## 2024-11-02 DIAGNOSIS — E7800 Pure hypercholesterolemia, unspecified: Secondary | ICD-10-CM | POA: Insufficient documentation

## 2024-11-02 DIAGNOSIS — R102 Pelvic and perineal pain unspecified side: Secondary | ICD-10-CM | POA: Insufficient documentation

## 2024-11-02 MED ORDER — SIMVASTATIN 20 MG PO TABS: 20.0000 mg | ORAL_TABLET | Freq: Every day | ORAL | 1 refills | Status: AC

## 2024-11-02 NOTE — Progress Notes (Signed)
  Subjective:     Patient ID: Rebecca [name], female   DOB: [birth year], 38 y.o.   MRN: [id number]  HPI [name] is a 38 year old white female, married, [id number] back in follow up on having been treated for PID 10/18/24, with 1 gm rocephin  IM in office and doxycycline  100 mg 1 bid x 14 days and metronidazole   500 mg 1 bid x 14 days, but stopped metronidazole  after few doses due to shortness of breath. She was seen in ER 10/14/24 for pelvic pain and pressure. She says she has no pain or pressure and feels good, ready to have sex, husband come in tomorrow.  She ask for refills on Zocor  too.    Component Value Date/Time   DIAGPAP  01/10/2023 1139    - Negative for Intraepithelial Lesions or Malignancy (NILM)   DIAGPAP - Benign reactive/reparative changes 01/10/2023 1139   DIAGPAP  06/06/2020 1051    - Negative for intraepithelial lesion or malignancy (NILM)   HPVHIGH Negative 01/10/2023 1139   HPVHIGH Negative 06/06/2020 1051   ADEQPAP  01/10/2023 1139    Satisfactory for evaluation; transformation zone component PRESENT.   ADEQPAP  06/06/2020 1051    Satisfactory for evaluation; transformation zone component PRESENT.   ADEQPAP  07/07/2017 0000    Satisfactory for evaluation  endocervical/transformation zone component PRESENT.   PCP is Dr Bluford  Review of Systems Pelvic pain has resolved, no pressure Has not had sex yet Reviewed past medical,surgical, social and family history. Reviewed medications and allergies.     Objective:   Physical Exam BP 126/77 (BP Location: Left Arm, Patient Position: Sitting, Cuff Size: Normal)   Pulse 79   Ht 5' (1.524 m)   Wt 144 lb (65.3 kg)   LMP 10/25/2024 (Exact Date)   BMI 28.12 kg/m     Skin warm and dry.Pelvic: external genitalia is normal in appearance no lesions, vagina: pink,urethra has no lesions or masses noted, cervix:smooth, no CMT, uterus: normal size, shape and contour, non tender, no masses felt, adnexa: no masses or tenderness noted.  Bladder is non tender and no masses felt.  Upstream - 11/02/24 0857       Pregnancy Intention Screening   Does the patient want to become pregnant in the next year? No    Does the patient's partner want to become pregnant in the next year? No    Would the patient like to discuss contraceptive options today? No      Contraception Wrap Up   Current Method Vasectomy    End Method Vasectomy    Contraception Counseling Provided No         Examination chaperoned by Clarita Salt LPN  Assessment:     1. PID (acute pelvic inflammatory disease) (Primary) Has resolved   2. Pelvic pain Has resolved   3. Pelvic pressure in female Has resolved  4. Elevated cholesterol Will refill zocor  Meds ordered this encounter  Medications   simvastatin  (ZOCOR ) 20 MG tablet    Sig: Take 1 tablet (20 mg total) by mouth daily.    Dispense:  90 tablet    Refill:  1    Supervising Provider:   JAYNE VONN DEL [[id number]]       Plan:     Follow up prn

## 2024-11-08 ENCOUNTER — Encounter: Payer: Self-pay | Admitting: Nurse Practitioner

## 2024-11-09 ENCOUNTER — Ambulatory Visit (INDEPENDENT_AMBULATORY_CARE_PROVIDER_SITE_OTHER): Admitting: Gastroenterology

## 2024-11-09 ENCOUNTER — Encounter (INDEPENDENT_AMBULATORY_CARE_PROVIDER_SITE_OTHER): Payer: Self-pay | Admitting: Gastroenterology

## 2024-11-09 VITALS — BP 113/75 | HR 91 | Temp 97.9°F | Ht 60.0 in | Wt 140.8 lb

## 2024-11-09 DIAGNOSIS — K8020 Calculus of gallbladder without cholecystitis without obstruction: Secondary | ICD-10-CM | POA: Insufficient documentation

## 2024-11-09 DIAGNOSIS — R1013 Epigastric pain: Secondary | ICD-10-CM | POA: Insufficient documentation

## 2024-11-09 DIAGNOSIS — K581 Irritable bowel syndrome with constipation: Secondary | ICD-10-CM | POA: Insufficient documentation

## 2024-11-09 MED ORDER — RIFAXIMIN 550 MG PO TABS: 550.0000 mg | ORAL_TABLET | Freq: Three times a day (TID) | ORAL | 0 refills | Status: AC

## 2024-11-09 MED ORDER — DICYCLOMINE HCL 20 MG PO TABS: 10.0000 mg | ORAL_TABLET | Freq: Three times a day (TID) | ORAL | 3 refills | Status: AC

## 2024-11-09 NOTE — Patient Instructions (Signed)
 It was very nice to meet you today, as dicussed with will plan for the following :  1) Lab work and stool sample x2  2) after submitting stool sample start rifaximin  for 14 days  3) low FODMAP diet  4) Bentyl   5) IBgard and probiotics

## 2024-11-09 NOTE — Progress Notes (Signed)
 Ezequias Lard Faizan Amberlynn Tempesta , M.D. Gastroenterology & Hepatology [facility] Gastroenterology [address] [place], [place] [address] Primary Care Physician: Bluford Jacqulyn MATSU, DO [address] [place] [address]  Chief Complaint: Abdominal pain, bloating, diarrhea  History of Present Illness: Rebecca [name] is a 38 y.o. female with GERD who presents for evaluation of chronic abdominal pain bloating and diarrhea  Patient reports chronic abdominal pain located lower abdomen for many years.  This is accompanied with increased frequency of bowel movement on Bristol stool scale type VI and VII 3-4 times daily.  Patient reports this is aggravated with  Mexican food spicy food and fatty food and pain will occasionally come on right upper quadrant as well.  Patient reports excessive bloating and flatulence/The patient denies having any nausea, vomiting, fever, chills, hematochezia, melena, hematemesis, abdominal distention, abdominal pain, jaundice, pruritus or weight loss.  Last ZHI:wnwz Last Colonoscopy:none  FHx: neg for any gastrointestinal/liver disease, no malignancies Social: Active smoking Surgical: no abdominal surgeries  Labs 03/12/2024 normal liver enzymes hemoglobin 14.4 platelet 218  Past Medical History: Past Medical History:  Diagnosis Date   Anxiety    Back pain    GERD (gastroesophageal reflux disease)    Hyperlipidemia    Smoker 05/14/2016    Past Surgical History: Past Surgical History:  Procedure Laterality Date   NO PAST SURGERIES      Family History: Family History  Problem Relation Age of Onset   Other Mother        brain tumor   Dementia Mother    CAD Father    Stroke Father    Liver disease Father    Alcohol abuse Father    Anxiety disorder Sister    Cancer Maternal Grandmother        lung   Cancer Maternal Grandfather        unsure    Other Daughter        acid reflux   Asthma Son    Bronchitis Son    ADD / ADHD  Son    Uterine cancer Neg Hx    Bladder Cancer Neg Hx    Renal cancer Neg Hx     Social History: Social History   Tobacco Use  Smoking Status Every Day   Current packs/day: 0.50   Average packs/day: 0.5 packs/day for 15.0 years (7.5 ttl pk-yrs)   Types: Cigarettes   Passive exposure: Current  Smokeless Tobacco Never   Social History   Substance and Sexual Activity  Alcohol Use No   Social History   Substance and Sexual Activity  Drug Use Never    Allergies: Allergies  Allergen Reactions   Amoxicillin Hives   Metronidazole  Shortness Of Breath   Cephalosporins Nausea And Vomiting   Septra [Bactrim] Nausea And Vomiting   Ciprocinonide [Fluocinolone] Nausea And Vomiting   Macrobid  [Nitrofurantoin ] Hives   Penicillins Hives    Medications: Current Outpatient Medications  Medication Sig Dispense Refill   Acetaminophen  (TYLENOL  PO) Take by mouth.     ALPRAZolam  (XANAX ) 1 MG tablet Take 1 tablet (1 mg total) by mouth 2 (two) times daily as needed for anxiety. 60 tablet 5   ibuprofen  (ADVIL ) 200 MG tablet Take 400 mg by mouth every 6 (six) hours as needed.     Multiple Vitamin (MULTIVITAMIN) tablet Take 1 tablet by mouth daily.     omeprazole  (PRILOSEC) 20 MG capsule TAKE 1 CAPSULE BY MOUTH TWICE A DAY FOR PRN GERD 60  capsule 2   ondansetron  (ZOFRAN ) 4 MG tablet Take 1 tablet (4 mg total) by mouth every 8 (eight) hours as needed. 30 tablet 1   sertraline  (ZOLOFT ) 50 MG tablet Take 1 tablet (50 mg total) by mouth daily. 90 tablet 1   simvastatin  (ZOCOR ) 20 MG tablet Take 1 tablet (20 mg total) by mouth daily. 90 tablet 1   sucralfate  (CARAFATE ) 1 g tablet Take 1 tablet (1 g total) by mouth 4 (four) times daily -  with meals and at bedtime. Prn acid reflux 40 tablet 0   No current facility-administered medications for this visit.    Review of Systems: GENERAL: negative for malaise, night sweats HEENT: No changes in hearing or vision, no nose bleeds or other nasal  problems. NECK: Negative for lumps, goiter, pain and significant neck swelling RESPIRATORY: Negative for cough, wheezing CARDIOVASCULAR: Negative for chest pain, leg swelling, palpitations, orthopnea GI: SEE HPI MUSCULOSKELETAL: Negative for joint pain or swelling, back pain, and muscle pain. SKIN: Negative for lesions, rash HEMATOLOGY Negative for prolonged bleeding, bruising easily, and swollen nodes. ENDOCRINE: Negative for cold or heat intolerance, polyuria, polydipsia and goiter. NEURO: negative for tremor, gait imbalance, syncope and seizures. The remainder of the review of systems is noncontributory.   Physical Exam: BP 113/75   Pulse 91   Temp 97.9 F (36.6 C)   Ht 5' (1.524 m)   Wt 140 lb 12.8 oz (63.9 kg)   LMP 10/25/2024 (Exact Date)   BMI 27.50 kg/m  GENERAL: The patient is AO x3, in no acute distress. HEENT: Head is normocephalic and atraumatic. EOMI are intact. Mouth is well hydrated and without lesions. NECK: Supple. No masses LUNGS: Clear to auscultation. No presence of rhonchi/wheezing/rales. Adequate chest expansion HEART: RRR, normal s1 and s2. ABDOMEN: Soft, nontender, no guarding, no peritoneal signs, and nondistended. BS +. No masses.  Imaging/Labs: as above     Latest Ref Rng & Units 10/14/2024    4:05 PM 10/07/2024   11:15 AM 08/09/2024    9:10 AM  CBC  WBC 4.0 - 10.5 K/uL 14.4  8.1  7.3   Hemoglobin 12.0 - 15.0 g/dL 85.5  86.0  86.7   Hematocrit 36.0 - 46.0 % 43.9  41.5  40.1   Platelets 150 - 400 K/uL 218  209  172    No results found for: IRON, TIBC, FERRITIN  I personally reviewed and interpreted the available labs, imaging and endoscopic files.  Ct scan 10/2024   IMPRESSION: 1. No acute intra-abdominal or pelvic pathology. 2. Colonic diverticulosis. No bowel obstruction. Normal appendix. 3. Cholelithiasis. 4.  Aortic Atherosclerosis (ICD10-I70.0).  Impression and Plan:  Rebecca [name] is a 38 y.o. female with GERD who  presents for evaluation of chronic abdominal pain bloating and diarrhea  #Abdominal pain #Diarrhea #Bloating/dyspepsia  Patient had chronic symptoms with multiple CT scans demonstrating diverticulosis left renal calculi and cholelithiasis  Patient has abdominal discomfort with change in frequency and character of bowel movements relieved  with defecation typical of irritable bowel syndrome diarrhea type (IBS-C)  Reviewed CT scans which did not show any significant stool burden.  Labs without anemia and no alarm symptoms such as hematochezia or weight loss.  Patient is without antibiotic  Will obtain fecal calprotectin celiac panel and alpha gal, H. pylori stool antigen  - Explained presumed etiology of IBS symptoms. Patient was counseled about the benefit of implementing a low FODMAP to improve symptoms and recurrent episodes. A dietary list was provided  to the patient. Also, the patient was counseled about the benefit of avoiding stressing situations and potential environmental triggers leading to symptomatology.  - Start IBGard 1 tablet every 8-12 hours as needed -Start Bentyl  - Trial of rifaximin  for 14 days for IBS-C - If above workup and intervention does not explain patient's symptoms would recommend upper endoscopy and colonoscopy in future and possibly a referral for cholecystectomy if all fails    Orders Placed This Encounter  Procedures   Calprotectin, Fecal   C-reactive protein   Ferritin   Celiac Ab tTG DGP TIgA   IgA   Alpha-Gal Panel   H. pylori antigen, stool    All questions were answered.      Anniya Whiters Faizan Kittie Krizan, MD Gastroenterology and Hepatology [facility] Gastroenterology   This chart has been completed using [facility] Dictation software, and while attempts have been made to ensure accuracy , certain words and phrases may not be transcribed as intended

## 2024-11-13 LAB — CELIAC AB TTG DGP TIGA
Deamidated Gliadin Abs, IgA: 5 U (ref 0–19)
Deamidated Gliadin Abs, IgG: 1 U (ref 0–19)
Immunoglobulin A, (IgA) QN, Serum: 281 mg/dL (ref 87–352)
t-Transglutaminase (tTG) IgA: <2 U/mL (ref 0–3)
t-Transglutaminase (tTG) IgG: <2 U/mL (ref 0–5)

## 2024-11-13 LAB — ALPHA-GAL PANEL
Allergen Lamb IgE: <0.1 kU/L
Beef IgE: <0.1 kU/L
IgE (Immunoglobulin E), Serum: 27 [IU]/mL (ref 6–495)
O215-IgE Alpha-Gal: <0.1 kU/L
Pork IgE: <0.1 kU/L

## 2024-11-13 LAB — C-REACTIVE PROTEIN: CRP: 1 mg/L (ref 0–10)

## 2024-11-13 LAB — H. PYLORI ANTIGEN, STOOL: H pylori Ag, Stl: NEGATIVE

## 2024-11-13 LAB — FERRITIN: Ferritin: 22 ng/mL (ref 15–150)

## 2024-11-14 ENCOUNTER — Ambulatory Visit: Payer: Self-pay

## 2024-11-14 LAB — CALPROTECTIN, FECAL: Calprotectin, Fecal: 13 ug/g (ref 0–120)

## 2024-11-15 NOTE — Telephone Encounter (Signed)
 See patient's chart- mother was notified 11/04/24

## 2024-11-18 ENCOUNTER — Ambulatory Visit: Admitting: Family Medicine

## 2024-11-18 VITALS — BP 124/83 | HR 96 | Temp 98.2°F | Ht 60.0 in | Wt 141.0 lb

## 2024-11-18 DIAGNOSIS — J019 Acute sinusitis, unspecified: Secondary | ICD-10-CM | POA: Diagnosis not present

## 2024-11-18 DIAGNOSIS — J329 Chronic sinusitis, unspecified: Secondary | ICD-10-CM | POA: Insufficient documentation

## 2024-11-18 MED ORDER — DOXYCYCLINE HYCLATE 100 MG PO TABS: 100.0000 mg | ORAL_TABLET | Freq: Two times a day (BID) | ORAL | 0 refills | Status: DC

## 2024-11-18 MED ORDER — PREDNISONE 20 MG PO TABS: 40.0000 mg | ORAL_TABLET | Freq: Every day | ORAL | 0 refills | Status: AC

## 2024-11-18 NOTE — Progress Notes (Signed)
 Subjective:  Patient ID: Rebecca [name], female    DOB: [birth year]  Age: 38 y.o. MRN: [id number]  CC:   Chief Complaint  Patient presents with   cough and congestion    Brown mucus w bad taste, sore throat, ear pain both, head congestion 2 days    HPI:  38 year old female presents for evaluation of the above.  Reports respiratory symptoms.  2 days.  Cough, sore throat, congestion, ear pain, wheezing.  Continues to smoke.  No fever.  No relieving factors.  Patient Active Problem List   Diagnosis Date Noted   Sinusitis 11/18/2024   Irritable bowel syndrome with constipation 11/09/2024   Gallstones 11/09/2024   Dyspepsia 11/09/2024   Elevated cholesterol 11/02/2024   Kidney stone on left side 08/05/2024   Cyst of left ovary 08/05/2024   Abnormal uterine bleeding 07/19/2024   Gastroesophageal reflux disease without esophagitis 03/01/2024   Recurrent UTI 08/06/2023   Hidradenitis suppurativa 05/23/2023   Encounter for removal and reinsertion of Nexplanon  01/14/2023   OAB (overactive bladder) 04/25/2020   Dyslipidemia (high LDL; low HDL) 09/17/2019   Smoker 05/14/2016   Depression with anxiety 04/05/2013    Social Hx   Social History   Socioeconomic History   Marital status: Married    Spouse name: Not on file   Number of children: 3   Years of education: Not on file   Highest education level: 10th grade  Occupational History   Not on file  Tobacco Use   Smoking status: Every Day    Current packs/day: 0.50    Average packs/day: 0.5 packs/day for 15.0 years (7.5 ttl pk-yrs)    Types: Cigarettes    Passive exposure: Current   Smokeless tobacco: Never  Vaping Use   Vaping status: Never Used  Substance and Sexual Activity   Alcohol use: No   Drug use: Never   Sexual activity: Yes    Birth control/protection: Surgical    Comment: Husband had a vasectomy  Other Topics Concern   Not on file  Social History Narrative   Not on file   Social Drivers of Health    Tobacco Use: High Risk (11/09/2024)   Patient History    Smoking Tobacco Use: Every Day    Smokeless Tobacco Use: Never    Passive Exposure: Current  Financial Resource Strain: Low Risk (10/06/2024)   Overall Financial Resource Strain (CARDIA)    Difficulty of Paying Living Expenses: Not hard at all  Food Insecurity: No Food Insecurity (10/06/2024)   Epic    Worried About Programme Researcher, Broadcasting/film/video in the Last Year: Never true    [employer] of Food in the Last Year: Never true  Transportation Needs: No Transportation Needs (10/06/2024)   Epic    Lack of Transportation (Medical): No    Lack of Transportation (Non-Medical): No  Physical Activity: Insufficiently Active (10/06/2024)   Exercise Vital Sign    Days of Exercise per Week: 2 days    Minutes of Exercise per Session: 30 min  Stress: No Stress Concern Present (10/06/2024)   [employer] of Occupational Health - Occupational Stress Questionnaire    Feeling of Stress: Not at all  Social Connections: Moderately Isolated (10/06/2024)   Social Connection and Isolation Panel    Frequency of Communication with Friends and Family: More than three times a week    Frequency of Social Gatherings with Friends and Family: Once a week    Attends Religious Services: Patient declined  Active Member of Clubs or Organizations: No    Attends Banker Meetings: Not on file    Marital Status: Married  Depression [id number]): Low Risk (11/18/2024)   Depression (PHQ2-9)    PHQ-2 Score: 0  Alcohol Screen: Low Risk (01/14/2024)   Alcohol Screen    Last Alcohol Screening Score (AUDIT): 0  Housing: Unknown (10/06/2024)   Epic    Unable to Pay for Housing in the Last Year: No    Number of Times Moved in the Last Year: Not on file    Homeless in the Last Year: No  Utilities: Not At Risk (01/14/2024)   AHC Utilities    Threatened with loss of utilities: No  Health Literacy: Not on file    Review of Systems Per HPI  Objective:  BP 124/83    Pulse 96   Temp 98.2 F (36.8 C)   Ht 5' (1.524 m)   Wt 141 lb (64 kg)   LMP 10/25/2024 (Exact Date)   SpO2 99%   BMI 27.54 kg/m      11/18/2024   10:42 AM 11/09/2024   10:33 AM 11/02/2024    8:55 AM  BP/Weight  Systolic BP 124 113 126  Diastolic BP 83 75 77  Wt. (Lbs) 141 140.8 144  BMI 27.54 kg/m2 27.5 kg/m2 28.12 kg/m2    Physical Exam Vitals and nursing note reviewed.  Constitutional:      General: She is not in acute distress.    Appearance: Normal appearance.  HENT:     Head: Normocephalic and atraumatic.     Right Ear: Tympanic membrane normal.     Left Ear: Tympanic membrane normal.     Nose: Congestion present.     Mouth/Throat:     Pharynx: Oropharynx is clear.  Eyes:     Conjunctiva/sclera: Conjunctivae normal.  Cardiovascular:     Rate and Rhythm: Normal rate and regular rhythm.  Pulmonary:     Effort: Pulmonary effort is normal.     Breath sounds: No wheezing or rales.  Neurological:     Mental Status: She is alert.     Lab Results  Component Value Date   WBC 14.4 (H) 10/14/2024   HGB 14.4 10/14/2024   HCT 43.9 10/14/2024   PLT 218 10/14/2024   GLUCOSE 98 10/14/2024   CHOL 178 07/19/2024   TRIG 143 07/19/2024   HDL 33 (L) 07/19/2024   LDLCALC 119 (H) 07/19/2024   ALT 25 10/14/2024   AST 21 10/14/2024   NA 141 10/14/2024   K 4.0 10/14/2024   CL 105 10/14/2024   CREATININE 0.77 10/14/2024   BUN 7 10/14/2024   CO2 26 10/14/2024   TSH 0.946 07/19/2024     Assessment & Plan:  Acute sinusitis, recurrence not specified, unspecified location Assessment & Plan: Treatment doxycycline .  Also given prednisone  for wheezing.   Other orders -     Doxycycline  Hyclate; Take 1 tablet (100 mg total) by mouth 2 (two) times daily.  Dispense: 14 tablet; Refill: 0 -     predniSONE ; Take 2 tablets (40 mg total) by mouth daily with breakfast for 5 days.  Dispense: 10 tablet; Refill: 0    Follow-up:  Return if symptoms worsen or fail to  improve.  Jacqulyn Ahle DO [facility]

## 2024-11-18 NOTE — Assessment & Plan Note (Signed)
 Treatment doxycycline .  Also given prednisone  for wheezing.

## 2024-11-18 NOTE — Patient Instructions (Signed)
Medications as prescribed. ° °Take care ° °Dr. Allon Costlow  °

## 2024-11-19 ENCOUNTER — Ambulatory Visit (INDEPENDENT_AMBULATORY_CARE_PROVIDER_SITE_OTHER): Payer: Self-pay | Admitting: Gastroenterology

## 2024-11-19 NOTE — Progress Notes (Signed)
 Hi Devere,   Can you please schedule a EGD/Colonoscopy? Dx: Iron deficiency. Room: Any   Thanks,  Bhavika Schnider Faizan Rivka Baune, MD Gastroenterology and Hepatology [facility]astroenter[facility]  =============== Hi Glenys ,  Can you please call the patient and tell the patient the lab work is negative for H. pylori, celiac disease alpha gal which is all good news.  Although it suggest that you have iron deficiency and I do recommend upper endoscopy and colonoscopy to evaluate the cause of iron deficiency especially if you do not have heavy menstruation.  If patient agrees we can schedule her procedure directly  Thanks,  Nico Syme Faizan Ulyana Pitones, MD Gastroenterology and Hepatology [facility] Gastroenterology =============  H. pylori stool antigen negative  CRP 13 Fecal calprotectin 13  Negative celiac panel Negative alpha gal ferritin 22 indicating iron deficiency

## 2024-12-14 NOTE — Telephone Encounter (Signed)
 ATC pt to schedule TCS/EGD, no answer. LVM for call back.

## 2024-12-25 ENCOUNTER — Encounter: Payer: Self-pay | Admitting: Nurse Practitioner

## 2024-12-25 ENCOUNTER — Other Ambulatory Visit: Payer: Self-pay | Admitting: Nurse Practitioner

## 2024-12-25 MED ORDER — CLINDAMYCIN HCL 300 MG PO CAPS: 300.0000 mg | ORAL_CAPSULE | Freq: Three times a day (TID) | ORAL | 0 refills | Status: AC

## 2025-04-21 ENCOUNTER — Ambulatory Visit: Admitting: Nurse Practitioner
